# Patient Record
Sex: Male | Born: 1960 | Race: Black or African American | Hispanic: No | Marital: Married | Smoking: Current every day smoker
Health system: Southern US, Community
[De-identification: ages and names within clinical notes are randomized; demographics above are authoritative.]

## PROBLEM LIST (undated history)

## (undated) ENCOUNTER — Emergency Department: Admission: EM | Payer: BC Managed Care – PPO | Source: Home / Self Care

## (undated) DIAGNOSIS — I10 Essential (primary) hypertension: Secondary | ICD-10-CM

## (undated) DIAGNOSIS — K429 Umbilical hernia without obstruction or gangrene: Secondary | ICD-10-CM

## (undated) DIAGNOSIS — I739 Peripheral vascular disease, unspecified: Secondary | ICD-10-CM

## (undated) DIAGNOSIS — Z992 Dependence on renal dialysis: Secondary | ICD-10-CM

## (undated) DIAGNOSIS — Z8739 Personal history of other diseases of the musculoskeletal system and connective tissue: Secondary | ICD-10-CM

## (undated) DIAGNOSIS — N186 End stage renal disease: Secondary | ICD-10-CM

## (undated) DIAGNOSIS — I509 Heart failure, unspecified: Secondary | ICD-10-CM

## (undated) DIAGNOSIS — J449 Chronic obstructive pulmonary disease, unspecified: Secondary | ICD-10-CM

## (undated) DIAGNOSIS — E785 Hyperlipidemia, unspecified: Secondary | ICD-10-CM

## (undated) DIAGNOSIS — E119 Type 2 diabetes mellitus without complications: Secondary | ICD-10-CM

## (undated) DIAGNOSIS — R0600 Dyspnea, unspecified: Secondary | ICD-10-CM

## (undated) DIAGNOSIS — I639 Cerebral infarction, unspecified: Secondary | ICD-10-CM

## (undated) HISTORY — PX: CARDIAC CATHETERIZATION: SHX172

## (undated) HISTORY — PX: EXTERIORIZATION OF A CONTINUOUS AMBULATORY PERITONEAL DIALYSIS CATHETER: SHX6382

## (undated) HISTORY — PX: FRACTURE SURGERY: SHX138

## (undated) HISTORY — PX: WRIST FRACTURE SURGERY: SHX121

## (undated) HISTORY — DX: Essential (primary) hypertension: I10

## (undated) HISTORY — DX: Hyperlipidemia, unspecified: E78.5

## (undated) SURGERY — ECHOCARDIOGRAM, TRANSESOPHAGEAL
Anesthesia: Moderate Sedation

---

## 2009-12-31 ENCOUNTER — Ambulatory Visit: Payer: Self-pay | Admitting: General Practice

## 2010-01-01 ENCOUNTER — Ambulatory Visit: Payer: Self-pay | Admitting: General Practice

## 2010-01-07 ENCOUNTER — Ambulatory Visit: Payer: Self-pay | Admitting: Cardiovascular Disease

## 2010-09-22 ENCOUNTER — Ambulatory Visit: Payer: Self-pay | Admitting: Vascular Surgery

## 2010-09-22 DIAGNOSIS — I10 Essential (primary) hypertension: Secondary | ICD-10-CM

## 2010-10-20 ENCOUNTER — Ambulatory Visit: Payer: Self-pay | Admitting: Vascular Surgery

## 2015-02-16 ENCOUNTER — Other Ambulatory Visit: Payer: Self-pay | Admitting: Adult Health

## 2015-02-16 DIAGNOSIS — R1011 Right upper quadrant pain: Secondary | ICD-10-CM

## 2015-02-18 ENCOUNTER — Ambulatory Visit
Admission: RE | Admit: 2015-02-18 | Discharge: 2015-02-18 | Disposition: A | Discharge status: Home / Self Care | Payer: Medicare Other | Source: Ambulatory Visit | Attending: Family Medicine | Admitting: Family Medicine

## 2015-02-18 ENCOUNTER — Other Ambulatory Visit: Payer: Self-pay | Admitting: Adult Health

## 2015-02-18 DIAGNOSIS — K760 Fatty (change of) liver, not elsewhere classified: Secondary | ICD-10-CM | POA: Insufficient documentation

## 2015-02-18 DIAGNOSIS — R1011 Right upper quadrant pain: Secondary | ICD-10-CM | POA: Insufficient documentation

## 2015-02-22 ENCOUNTER — Ambulatory Visit: Payer: Medicare Other

## 2016-01-25 ENCOUNTER — Other Ambulatory Visit: Payer: Self-pay | Admitting: Adult Health

## 2016-01-25 ENCOUNTER — Ambulatory Visit: Payer: Medicare Other

## 2016-01-25 DIAGNOSIS — R600 Localized edema: Secondary | ICD-10-CM

## 2016-01-26 ENCOUNTER — Ambulatory Visit
Admission: RE | Admit: 2016-01-26 | Discharge: 2016-01-26 | Disposition: A | Discharge status: Home / Self Care | Payer: Medicare Other | Source: Ambulatory Visit | Attending: Adult Health | Admitting: Adult Health

## 2016-01-26 DIAGNOSIS — R600 Localized edema: Secondary | ICD-10-CM

## 2016-02-22 ENCOUNTER — Other Ambulatory Visit (INDEPENDENT_AMBULATORY_CARE_PROVIDER_SITE_OTHER): Payer: Self-pay | Admitting: Vascular Surgery

## 2016-02-22 ENCOUNTER — Encounter (INDEPENDENT_AMBULATORY_CARE_PROVIDER_SITE_OTHER): Payer: Self-pay

## 2016-02-23 ENCOUNTER — Telehealth (INDEPENDENT_AMBULATORY_CARE_PROVIDER_SITE_OTHER): Payer: Self-pay

## 2016-02-23 ENCOUNTER — Ambulatory Visit
Admission: RE | Admit: 2016-02-23 | Payer: BC Managed Care – PPO | Source: Ambulatory Visit | Admitting: Vascular Surgery

## 2016-02-23 ENCOUNTER — Encounter: Admission: RE | Payer: Self-pay | Source: Ambulatory Visit

## 2016-02-23 SURGERY — DIALYSIS/PERMA CATHETER INSERTION
Anesthesia: Moderate Sedation

## 2016-02-23 MED ORDER — DEXTROSE 5 % IV SOLN: 1.5000 g | INTRAVENOUS | Status: DC

## 2016-02-23 NOTE — Telephone Encounter (Signed)
Per the order from Dr. Thedore Mins this was to be an ASAP appt. That was scheduled.

## 2016-02-23 NOTE — Telephone Encounter (Signed)
Patient was scheduled on 02/23/16 for a permcath insertion and his wife was called and given this information for him. This was ordered by Dr. Thedore Mins, patient canceled this procedure stating he knew nothing about it and he is at work.

## 2016-02-25 ENCOUNTER — Telehealth (INDEPENDENT_AMBULATORY_CARE_PROVIDER_SITE_OTHER): Payer: Self-pay

## 2016-02-25 NOTE — Telephone Encounter (Signed)
Patient's wife called and left a message this morning for me to call the patient regarding his procedure. I attempted to contact the patient but had to leave a message for him to return my call.

## 2016-02-28 ENCOUNTER — Ambulatory Visit (INDEPENDENT_AMBULATORY_CARE_PROVIDER_SITE_OTHER): Payer: Medicare Other | Admitting: Vascular Surgery

## 2016-02-28 ENCOUNTER — Encounter (INDEPENDENT_AMBULATORY_CARE_PROVIDER_SITE_OTHER): Payer: Self-pay | Admitting: Vascular Surgery

## 2016-02-28 ENCOUNTER — Encounter (INDEPENDENT_AMBULATORY_CARE_PROVIDER_SITE_OTHER): Payer: Medicare Other | Admitting: Vascular Surgery

## 2016-02-28 DIAGNOSIS — E1122 Type 2 diabetes mellitus with diabetic chronic kidney disease: Secondary | ICD-10-CM

## 2016-02-28 DIAGNOSIS — N2889 Other specified disorders of kidney and ureter: Secondary | ICD-10-CM | POA: Insufficient documentation

## 2016-02-28 DIAGNOSIS — I10 Essential (primary) hypertension: Secondary | ICD-10-CM

## 2016-02-28 DIAGNOSIS — I890 Lymphedema, not elsewhere classified: Secondary | ICD-10-CM | POA: Diagnosis not present

## 2016-02-28 DIAGNOSIS — E119 Type 2 diabetes mellitus without complications: Secondary | ICD-10-CM | POA: Insufficient documentation

## 2016-02-28 DIAGNOSIS — N184 Chronic kidney disease, stage 4 (severe): Secondary | ICD-10-CM | POA: Insufficient documentation

## 2016-02-28 DIAGNOSIS — N185 Chronic kidney disease, stage 5: Secondary | ICD-10-CM | POA: Diagnosis not present

## 2016-02-28 NOTE — Progress Notes (Signed)
MRN : [id number]  Justin Fitzgerald is a 55 y.o. (1960/09/22) male who presents with chief complaint of  Chief Complaint  Patient presents with  . New Patient (Initial Visit)  .  History of Present Illness: Patient is seen for evaluation of leg swelling. The patient first noticed the swelling remotely but is now concerned because of a significant increase in the overall edema. The swelling is associated with pain and discoloration. The patient notes that in the morning the legs are significantly improved but they steadily worsened throughout the course of the day. Elevation makes the legs better, dependency makes them much worse. There is no history of ulcerations associated with the swelling. The patient denies any recent changes in their medications.  The patient has not been wearing graduated compression.  The patient denies a history of DVT or PE. There is no prior history of phlebitis. There is no history of primary lymphedema.  There is no history of radiation treatment to the groin or pelvis No history of malignancies. No history of trauma or groin or pelvic surgery. No history of foreign travel or parasitic infections area    Current Outpatient Prescriptions  Medication Sig Dispense Refill  . amLODipine (NORVASC) 10 MG tablet Take by mouth.    . cinacalcet (SENSIPAR) 30 MG tablet Take by mouth.    . cyclobenzaprine (FLEXERIL) 10 MG tablet     . doxycycline (VIBRAMYCIN) 100 MG capsule     . folic acid-vitamin b complex-vitamin c-selenium-zinc (DIALYVITE) 3 MG TABS tablet Take 1 tablet by mouth daily.    . furosemide (LASIX) 40 MG tablet Take by mouth.    . gabapentin (NEURONTIN) 300 MG capsule     . HYDROcodone-acetaminophen (NORCO/VICODIN) 5-325 MG tablet Take by mouth.    . metoprolol succinate (TOPROL-XL) 100 MG 24 hr tablet     . pioglitazone (ACTOS) 45 MG tablet TAKE 1 BY MOUTH ONCE A DAY    . SPIRIVA HANDIHALER 18 MCG inhalation capsule      No current  facility-administered medications for this visit.     Past Medical History:  Diagnosis Date  . Chronic kidney disease   . Diabetes mellitus without complication (HCC)   . Hyperlipidemia   . Hypertension     Past Surgical History:  Procedure Laterality Date  . arm surgery Right   . WRIST SURGERY Right     Social History Social History  Substance Use Topics  . Smoking status: Current Every Day Smoker    Types: Cigarettes  . Smokeless tobacco: Current User  . Alcohol use No    Family History Family History  Problem Relation Age of Onset  . Hypertension Mother   No family history of bleeding/clotting disorders, porphyria or autoimmune disease   Allergies  Allergen Reactions  . Ramipril Shortness Of Breath  . Aspirin     Other reaction(s): Unknown kidney disease     REVIEW OF SYSTEMS (Negative unless checked)  Constitutional: [] Weight loss  [] Fever  [] Chills Cardiac: [] Chest pain   [] Chest pressure   [] Palpitations   [] Shortness of breath at rest   [] Shortness of breath with exertion. Vascular:  [] Pain in legs with walking   [x] Pain in legs at rest    [] Pain in feet at rest    [] History of DVT   [] Phlebitis   [x] Swelling in legs   [] Varicose veins   [] Non-healing ulcers Pulmonary:   [] Uses home oxygen   [] Productive cough   [] Hemoptysis   [] Wheeze  []   COPD   [] Asthma Neurologic:  [] Dizziness  [] Blackouts   [] Seizures   [] History of stroke   [] History of TIA  [] Aphasia   [] Temporary blindness   [] Dysphagia   [] Weakness or numbness in arm   [] Weakness or numbness in leg Musculoskeletal:  [] Arthritis   [] Joint swelling   [] Joint pain   [] Low back pain Hematologic:  [] Easy bruising  [] Easy bleeding   [] Hypercoagulable state   [] Anemic   Gastrointestinal:  [] Blood in stool   [] Vomiting blood  [] Gastroesophageal reflux/heartburn   [] Difficulty swallowing. Genitourinary:  [x] Chronic kidney disease   [] Difficult urination  [] Frequent urination  [] Burning with urination   [] Blood  in urine Skin:  [] Rashes   [] Ulcers   Psychological:  [] History of anxiety   []  History of major depression.    Physical Examination  Vitals:   02/28/16 1342  BP: 109/67  Pulse: 81  Resp: 17  Weight: (!) 371 lb (168.3 kg)  Height: 6' (1.829 m)   Body mass index is 50.32 kg/m. Gen:  WD/WN, NAD Head: [place]/AT, No temporalis wasting. Ear/Nose/Throat: Hearing grossly intact, nares w/o erythema or drainage, trachea midline Eyes: PERR, EOM appear normal. Sclera non-icteric Neck: Supple, no nuchal rigidity.  No bruit or JVD.  Pulmonary:  Good air movement, equal and clear to auscultation bilaterally.  Cardiac: RRR, normal S1, S2, no Murmurs, rubs or gallops. Vascular:   3+ edema bilaterally moderate venous changes Vessel Right Left  Radial Palpable Palpable  Ulnar Palpable Palpable  Brachial Palpable Palpable  Carotid Palpable Palpable  Aorta Not palpable N/A  Femoral Palpable Palpable  Popliteal Palpable Palpable  PT Palpable Palpable  DP Palpable Palpable   Gastrointestinal: soft, non-rigid/non-distended. No guarding.  Musculoskeletal: M/S 5/5 throughout.  No deformity or atrophy. Neurologic: CN 2-12 intact. Pain and light touch intact in extremities.  Speech is fluent. Motor exam as listed above. Psychiatric: Judgment intact, Mood & affect appropriate for pt's clinical situation. Dermatologic: No rashes or ulcers noted.  No signs consistent with cellulitis. Lymphatic:  No cervical lymphadenopathy  CBC No results found for: WBC, HGB, HCT, MCV, PLT  BMET No results found for: NA, K, CL, CO2, GLUCOSE, BUN, CREATININE, CALCIUM, GFRNONAA, GFRAA CrCl cannot be calculated (No order found.).  COAG No results found for: INR, PROTIME  Radiology No results found.    Assessment/Plan 1. Chronic renal insufficiency, stage 4 (severe) (HCC) Recommend:  At this time the patient does not have appropriate extremity access for dialysis  Patient should have vein mapping to see  what access can be created.  All questions were answered.  The patient agrees to proceed with mapping for before surgery.   - VAS US UPPER EXTREMITY VEIN MAPPING; Future  2. Lymphedema Compression therapy is recommended - VAS US LOWER EXTREMITY VENOUS (DVT); Future  3. Essential hypertension Continue antihypertensive medications no changes  4. 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  Any errors from dictation are purely unintentional

## 2016-03-08 ENCOUNTER — Other Ambulatory Visit (INDEPENDENT_AMBULATORY_CARE_PROVIDER_SITE_OTHER): Payer: Self-pay | Admitting: Vascular Surgery

## 2016-03-08 ENCOUNTER — Ambulatory Visit
Admission: RE | Admit: 2016-03-08 | Discharge: 2016-03-08 | Disposition: A | Discharge status: Home / Self Care | Payer: Medicare Other | Source: Ambulatory Visit | Attending: Vascular Surgery | Admitting: Vascular Surgery

## 2016-03-08 ENCOUNTER — Encounter
Admission: RE | Disposition: A | Discharge status: Home / Self Care | Payer: Self-pay | Source: Ambulatory Visit | Attending: Vascular Surgery

## 2016-03-08 ENCOUNTER — Encounter: Payer: Self-pay | Admitting: *Deleted

## 2016-03-08 DIAGNOSIS — Z888 Allergy status to other drugs, medicaments and biological substances status: Secondary | ICD-10-CM | POA: Diagnosis not present

## 2016-03-08 DIAGNOSIS — I890 Lymphedema, not elsewhere classified: Secondary | ICD-10-CM | POA: Insufficient documentation

## 2016-03-08 DIAGNOSIS — E1122 Type 2 diabetes mellitus with diabetic chronic kidney disease: Secondary | ICD-10-CM | POA: Diagnosis not present

## 2016-03-08 DIAGNOSIS — N186 End stage renal disease: Secondary | ICD-10-CM | POA: Diagnosis not present

## 2016-03-08 DIAGNOSIS — Z7984 Long term (current) use of oral hypoglycemic drugs: Secondary | ICD-10-CM | POA: Insufficient documentation

## 2016-03-08 DIAGNOSIS — Z8249 Family history of ischemic heart disease and other diseases of the circulatory system: Secondary | ICD-10-CM | POA: Diagnosis not present

## 2016-03-08 DIAGNOSIS — I120 Hypertensive chronic kidney disease with stage 5 chronic kidney disease or end stage renal disease: Secondary | ICD-10-CM | POA: Diagnosis not present

## 2016-03-08 DIAGNOSIS — F17200 Nicotine dependence, unspecified, uncomplicated: Secondary | ICD-10-CM | POA: Insufficient documentation

## 2016-03-08 DIAGNOSIS — Z886 Allergy status to analgesic agent status: Secondary | ICD-10-CM | POA: Insufficient documentation

## 2016-03-08 HISTORY — DX: Peripheral vascular disease, unspecified: I73.9

## 2016-03-08 HISTORY — PX: PERIPHERAL VASCULAR CATHETERIZATION: SHX172C

## 2016-03-08 LAB — POTASSIUM (ARMC VASCULAR LAB ONLY): POTASSIUM (ARMC VASCULAR LAB): 4.4 (ref 3.5–5.1)

## 2016-03-08 SURGERY — DIALYSIS/PERMA CATHETER INSERTION
Anesthesia: Moderate Sedation

## 2016-03-08 MED ORDER — HEPARIN (PORCINE) IN NACL 2-0.9 UNIT/ML-% IJ SOLN
INTRAMUSCULAR | Status: AC
  Filled 2016-03-08: qty 500

## 2016-03-08 MED ORDER — SODIUM CHLORIDE 0.9 % IV SOLN: INTRAVENOUS | Status: DC

## 2016-03-08 MED ORDER — ONDANSETRON HCL 4 MG/2ML IJ SOLN: 4.0000 mg | Freq: Four times a day (QID) | INTRAMUSCULAR | Status: DC | PRN

## 2016-03-08 MED ORDER — SODIUM CHLORIDE 0.9 % IV SOLN
INTRAVENOUS | Status: DC
  Administered 2016-03-08: 11:00:00 via INTRAVENOUS

## 2016-03-08 MED ORDER — METHYLPREDNISOLONE SODIUM SUCC 125 MG IJ SOLR: 125.0000 mg | INTRAMUSCULAR | Status: DC | PRN

## 2016-03-08 MED ORDER — HEPARIN SODIUM (PORCINE) 10000 UNIT/ML IJ SOLN
INTRAMUSCULAR | Status: AC
  Filled 2016-03-08: qty 1

## 2016-03-08 MED ORDER — DEXTROSE 5 % IV SOLN: 1.5000 g | INTRAVENOUS | Status: DC

## 2016-03-08 MED ORDER — FENTANYL CITRATE (PF) 100 MCG/2ML IJ SOLN
INTRAMUSCULAR | Status: AC
  Filled 2016-03-08: qty 2

## 2016-03-08 MED ORDER — FAMOTIDINE 20 MG PO TABS: 40.0000 mg | ORAL_TABLET | ORAL | Status: DC | PRN

## 2016-03-08 MED ORDER — HYDROMORPHONE HCL 1 MG/ML IJ SOLN: 1.0000 mg | Freq: Once | INTRAMUSCULAR | Status: DC

## 2016-03-08 MED ORDER — MIDAZOLAM HCL 5 MG/5ML IJ SOLN
INTRAMUSCULAR | Status: AC
  Filled 2016-03-08: qty 5

## 2016-03-08 MED ORDER — MIDAZOLAM HCL 2 MG/2ML IJ SOLN
INTRAMUSCULAR | Status: DC | PRN
  Administered 2016-03-08 (×3): 1 mg via INTRAVENOUS
  Administered 2016-03-08: 2 mg via INTRAVENOUS

## 2016-03-08 MED ORDER — FENTANYL CITRATE (PF) 100 MCG/2ML IJ SOLN
INTRAMUSCULAR | Status: DC | PRN
  Administered 2016-03-08: 25 ug via INTRAVENOUS
  Administered 2016-03-08 (×3): 50 ug via INTRAVENOUS

## 2016-03-08 MED ORDER — HEPARIN SODIUM (PORCINE) 1000 UNIT/ML IJ SOLN
INTRAMUSCULAR | Status: AC
  Filled 2016-03-08: qty 1

## 2016-03-08 MED ORDER — LIDOCAINE-EPINEPHRINE (PF) 1 %-1:200000 IJ SOLN
INTRAMUSCULAR | Status: AC
  Filled 2016-03-08: qty 30

## 2016-03-08 SURGICAL SUPPLY — 5 items
CATH PALINDROME RT-P 15FX23CM (CATHETERS) ×3 IMPLANT
PACK ANGIOGRAPHY (CUSTOM PROCEDURE TRAY) ×3 IMPLANT
SET INTRO CAPELLA COAXIAL (SET/KITS/TRAYS/PACK) ×3 IMPLANT
TOWEL OR 17X26 4PK STRL BLUE (TOWEL DISPOSABLE) ×3 IMPLANT
WIRE J 3MM .035X145CM (WIRE) ×3 IMPLANT

## 2016-03-08 NOTE — H&P (Signed)
[facility] History & Physical Update  The patient was interviewed and re-examined.  The patient's previous History and Physical has been reviewed and is unchanged.  There is no change in the plan of care. We plan to proceed with the scheduled procedure.  Levora Dredge, MD  03/08/2016, 2:39 PM

## 2016-03-08 NOTE — Op Note (Signed)
OPERATIVE NOTE   PROCEDURE: 1. Insertion of tunneled dialysis catheter right IJ approach with ultrasound and fluoroscopic guidance.  PRE-OPERATIVE DIAGNOSIS: End-stage renal disease requiring hemodialysis; complication dialysis device with inadequate treatment via peritoneal catheter  POST-OPERATIVE DIAGNOSIS: Same  SURGEON: Levora Dredge.  ANESTHESIA: Conscious sedation was administered under my direct supervision. IV Versed plus fentanyl were utilized. Continuous ECG, pulse oximetry and blood pressure was monitored throughout the entire procedure. Conscious sedation was for a total of 25 minutes.  ESTIMATED BLOOD LOSS: Minimal cc  CONTRAST USED:  None  FLUOROSCOPY TIME:  0.3 minutes  INDICATIONS:   Justin Fitzgerald a 55 y.o. y.o. male who presents with end-stage renal disease who is not done well on peritoneal and is now transitioning to hemodialysis.  DESCRIPTION: After obtaining full informed written consent, the patient was positioned supine. The right neck and chest wall was prepped and draped in a sterile fashion. Ultrasound was placed in a sterile sleeve. Ultrasound was utilized to identify the right internal jugular vein which is noted to be echolucent and compressible indicating patency. Images recorded for the permanent record. Under real-time visualization a micro-needle is inserted into the vein followed by the micro-wire. Micro-sheath was then advanced without difficulty and a J wire is advanced without difficulty under fluoroscopic guidance. Small counterincision was made at the wire insertion site. Dilators are passed over the wire and the tunneled dialysis catheter is fed into the central venous system without difficulty.  Under fluoroscopy the catheter tip positioned at the atrial caval junction. The catheter is then approximated to the chest wall and an exit site selected. 1% lidocaine is infiltrated in soft tissues at this level small incision is made and the  tunneling device is then passed from the exit site to the neck counterincision. Catheter is then connected to the tunneling device and the catheter was pulled subcutaneously. It is then transected and the hub assembly connected without difficulty. Both lumens aspirate and flush easily. After verification of smooth contour with proper tip position under fluoroscopy the catheter is packed with 5000 units of heparin per lumen.  Catheter secured to the skin of the right chest wall with 0 silk. A sterile dressing is applied with a Biopatch.  COMPLICATIONS: None  CONDITION: Good  Levora Dredge [facility]. Office:  [phone]   03/08/2016,2:41 PM

## 2016-03-14 ENCOUNTER — Encounter: Payer: Self-pay | Admitting: Vascular Surgery

## 2016-03-23 ENCOUNTER — Encounter (INDEPENDENT_AMBULATORY_CARE_PROVIDER_SITE_OTHER): Payer: Medicare Other | Admitting: Vascular Surgery

## 2016-03-27 ENCOUNTER — Ambulatory Visit (INDEPENDENT_AMBULATORY_CARE_PROVIDER_SITE_OTHER): Payer: BC Managed Care – PPO | Admitting: Vascular Surgery

## 2016-03-27 ENCOUNTER — Encounter (INDEPENDENT_AMBULATORY_CARE_PROVIDER_SITE_OTHER): Payer: Self-pay | Admitting: Vascular Surgery

## 2016-03-27 VITALS — BP 84/52 | HR 73 | Resp 17 | Ht 72.0 in | Wt 351.0 lb

## 2016-03-27 DIAGNOSIS — T829XXA Unspecified complication of cardiac and vascular prosthetic device, implant and graft, initial encounter: Secondary | ICD-10-CM | POA: Insufficient documentation

## 2016-03-27 DIAGNOSIS — T829XXS Unspecified complication of cardiac and vascular prosthetic device, implant and graft, sequela: Secondary | ICD-10-CM

## 2016-03-27 DIAGNOSIS — N186 End stage renal disease: Secondary | ICD-10-CM | POA: Diagnosis not present

## 2016-03-27 DIAGNOSIS — N185 Chronic kidney disease, stage 5: Secondary | ICD-10-CM | POA: Diagnosis not present

## 2016-03-27 DIAGNOSIS — I10 Essential (primary) hypertension: Secondary | ICD-10-CM | POA: Diagnosis not present

## 2016-03-27 DIAGNOSIS — E1122 Type 2 diabetes mellitus with diabetic chronic kidney disease: Secondary | ICD-10-CM

## 2016-03-27 NOTE — Progress Notes (Signed)
MRN : [id number]  Justin Fitzgerald is a 55 y.o. (1961-04-03) male who presents with chief complaint of  Chief Complaint  Patient presents with  . Follow-up  .  History of Present Illness:  The patient is seen for evaluation of dialysis access.  The patient has a history of  PD.    Current access is via a catheter which is functioning well.  There have not been any episodes of catheter infection.  The patient denies amaurosis fugax or recent TIA symptoms. There are no recent neurological changes noted. The patient denies claudication symptoms or rest pain symptoms. The patient denies history of DVT, PE or superficial thrombophlebitis. The patient denies recent episodes of angina or shortness of breath.   Current Meds  Medication Sig  . amLODipine (NORVASC) 10 MG tablet Take by mouth.  . cinacalcet (SENSIPAR) 30 MG tablet Take by mouth.  . cyclobenzaprine (FLEXERIL) 10 MG tablet   . doxycycline (VIBRAMYCIN) 100 MG capsule   . folic acid-vitamin b complex-vitamin c-selenium-zinc (DIALYVITE) 3 MG TABS tablet Take 1 tablet by mouth daily.  . furosemide (LASIX) 40 MG tablet Take 80 mg by mouth 2 (two) times daily.   [name]. gabapentin (NEURONTIN) 300 MG capsule   . HYDROcodone-acetaminophen (NORCO/VICODIN) 5-325 MG tablet Take by mouth.  . metoprolol succinate (TOPROL-XL) 100 MG 24 hr tablet   . pioglitazone (ACTOS) 45 MG tablet TAKE 1 BY MOUTH ONCE A DAY  . SPIRIVA HANDIHALER 18 MCG inhalation capsule   . traMADol (ULTRAM) 50 MG tablet Take by mouth every 4 (four) hours as needed.    Past Medical History:  Diagnosis Date  . Chronic kidney disease   . Diabetes mellitus without complication (HCC)   . Hyperlipidemia   . Hypertension   . Peripheral vascular disease [facility](HCC)     Past Surgical History:  Procedure Laterality Date  . arm surgery Right   . EXTERIORIZATION OF A CONTINUOUS AMBULATORY PERITONEAL DIALYSIS CATHETER    . PERIPHERAL VASCULAR CATHETERIZATION N/A 03/08/2016     Procedure: Dialysis/Perma Catheter Insertion;  Surgeon: Renford DillsGregory G Schnier, MD;  Location: [facility] INVASIVE CV LAB;  Service: Cardiovascular;  Laterality: N/A;  . WRIST SURGERY Right     Social History Social History  Substance Use Topics  . Smoking status: Current Every Day Smoker    Types: Cigarettes  . Smokeless tobacco: Current User  . Alcohol use No    Family History Family History  Problem Relation Age of Onset  . Hypertension Mother   No family history of bleeding/clotting disorders, porphyria or autoimmune disease   Allergies  Allergen Reactions  . Ramipril Shortness Of Breath  . Aspirin     Other reaction(s): Unknown kidney disease     REVIEW OF SYSTEMS (Negative unless checked)  Constitutional: [] Weight loss  [] Fever  [] Chills Cardiac: [] Chest pain   [] Chest pressure   [] Palpitations   [] Shortness of breath when laying flat   [] Shortness of breath with exertion. Vascular:  [] Pain in legs with walking   [] Pain in legs at rest  [] History of DVT   [] Phlebitis   [] Swelling in legs   [] Varicose veins   [] Non-healing ulcers Pulmonary:   [] Uses home oxygen   [] Productive cough   [] Hemoptysis   [] Wheeze  [] COPD   [] Asthma Neurologic:  [] Dizziness   [] Seizures   [] History of stroke   [] History of TIA  [] Aphasia   [] Vissual changes   [] Weakness or numbness in arm   [] Weakness or numbness in leg  Musculoskeletal:   [] Joint swelling   [] Joint pain   [] Low back pain Hematologic:  [] Easy bruising  [] Easy bleeding   [] Hypercoagulable state   [] Anemic Gastrointestinal:  [] Diarrhea   [] Vomiting  [] Gastroesophageal reflux/heartburn   [] Difficulty swallowing. Genitourinary:  [x] Chronic kidney disease   [] Difficult urination  [] Frequent urination   [] Blood in urine Skin:  [] Rashes   [] Ulcers  Psychological:  [] History of anxiety   []  History of major depression.  Physical Examination  Vitals:   03/27/16 1523  BP: (!) 84/52  Pulse: 73  Resp: 17  Weight: (!) 351 lb (159.2 kg)   Height: 6' (1.829 m)   Body mass index is 47.6 kg/m. Gen: WD/WN, NAD Head: [place]/AT, No temporalis wasting.  Ear/Nose/Throat: Hearing grossly intact, nares w/o erythema or drainage, poor dentition Eyes: PER, EOMI, sclera nonicteric.  Neck: Supple, no masses.  No bruit or JVD.  Pulmonary:  Good air movement, clear to auscultation bilaterally, no use of accessory muscles.  Cardiac: RRR, normal S1, S2, no Murmurs. Vascular: PD catheter in place no infection, tunneled catheter intact. Vessel Right Left  Radial Palpable Palpable  Ulnar Palpable Palpable  Brachial Palpable Palpable  Gastrointestinal: soft, non-distended. No guarding/no peritoneal signs.  Musculoskeletal: M/S 5/5 throughout.  No deformity or atrophy.  Neurologic: CN 2-12 intact. Pain and light touch intact in extremities.  Symmetrical.  Speech is fluent. Motor exam as listed above. Psychiatric: Judgment intact, Mood & affect appropriate for pt's clinical situation. Dermatologic: No rashes or ulcers noted.  No changes consistent with cellulitis. Lymph : No Cervical lymphadenopathy, no lichenification or skin changes of chronic lymphedema.  CBC No results found for: WBC, HGB, HCT, MCV, PLT  BMET No results found for: NA, K, CL, CO2, GLUCOSE, BUN, CREATININE, CALCIUM, GFRNONAA, GFRAA CrCl cannot be calculated (No order found.).  COAG No results found for: INR, PROTIME  Radiology No results found.   Assessment/Plan 1. End stage renal disease (HCC) Recommend:  The patient is doing well and currently has adequate dialysis catheter access. The patient's dialysis center is not reporting any access issues. He is hopeful to return to PD   The patient will follow-up with me in the office as needed  2. Complication from renal dialysis device, sequela See #1  3. Essential hypertension Continue antihypertensive medications as already ordered and reviewed, no changes at this time.  Continue statin as ordered and  reviewed, no changes at this time  4. Type 2 diabetes mellitus with stage 5 chronic kidney disease not on chronic dialysis, without long-term current use of insulin (HCC) Continue antihypertensive medications as already ordered and reviewed, no changes at this time.   Levora Dredge, MD  03/27/2016 5:40 PM

## 2016-03-28 ENCOUNTER — Other Ambulatory Visit: Payer: Self-pay | Admitting: Family Medicine

## 2016-03-28 DIAGNOSIS — M79661 Pain in right lower leg: Secondary | ICD-10-CM

## 2016-04-12 ENCOUNTER — Encounter (INDEPENDENT_AMBULATORY_CARE_PROVIDER_SITE_OTHER): Payer: Self-pay

## 2016-04-12 ENCOUNTER — Other Ambulatory Visit (INDEPENDENT_AMBULATORY_CARE_PROVIDER_SITE_OTHER): Payer: Self-pay | Admitting: Vascular Surgery

## 2016-04-12 ENCOUNTER — Ambulatory Visit (INDEPENDENT_AMBULATORY_CARE_PROVIDER_SITE_OTHER): Payer: Medicare Other | Admitting: Vascular Surgery

## 2016-04-12 ENCOUNTER — Encounter (INDEPENDENT_AMBULATORY_CARE_PROVIDER_SITE_OTHER): Payer: Medicare Other

## 2016-04-13 ENCOUNTER — Ambulatory Visit
Admission: RE | Admit: 2016-04-13 | Discharge: 2016-04-13 | Disposition: A | Discharge status: Home / Self Care | Payer: Medicare Other | Source: Ambulatory Visit | Attending: Family Medicine | Admitting: Family Medicine

## 2016-04-20 ENCOUNTER — Ambulatory Visit
Admission: RE | Admit: 2016-04-20 | Discharge: 2016-04-20 | Disposition: A | Discharge status: Home / Self Care | Payer: Medicare Other | Source: Ambulatory Visit | Attending: Vascular Surgery | Admitting: Vascular Surgery

## 2016-04-20 ENCOUNTER — Encounter
Admission: RE | Disposition: A | Discharge status: Home / Self Care | Payer: Self-pay | Source: Ambulatory Visit | Attending: Vascular Surgery

## 2016-04-20 DIAGNOSIS — Z452 Encounter for adjustment and management of vascular access device: Secondary | ICD-10-CM | POA: Insufficient documentation

## 2016-04-20 DIAGNOSIS — Z8249 Family history of ischemic heart disease and other diseases of the circulatory system: Secondary | ICD-10-CM | POA: Insufficient documentation

## 2016-04-20 DIAGNOSIS — E1122 Type 2 diabetes mellitus with diabetic chronic kidney disease: Secondary | ICD-10-CM | POA: Diagnosis not present

## 2016-04-20 DIAGNOSIS — E1151 Type 2 diabetes mellitus with diabetic peripheral angiopathy without gangrene: Secondary | ICD-10-CM | POA: Diagnosis not present

## 2016-04-20 DIAGNOSIS — Z992 Dependence on renal dialysis: Secondary | ICD-10-CM | POA: Diagnosis not present

## 2016-04-20 DIAGNOSIS — E785 Hyperlipidemia, unspecified: Secondary | ICD-10-CM | POA: Diagnosis not present

## 2016-04-20 DIAGNOSIS — N186 End stage renal disease: Secondary | ICD-10-CM | POA: Insufficient documentation

## 2016-04-20 DIAGNOSIS — E119 Type 2 diabetes mellitus without complications: Secondary | ICD-10-CM

## 2016-04-20 DIAGNOSIS — F17200 Nicotine dependence, unspecified, uncomplicated: Secondary | ICD-10-CM | POA: Insufficient documentation

## 2016-04-20 DIAGNOSIS — Z886 Allergy status to analgesic agent status: Secondary | ICD-10-CM | POA: Insufficient documentation

## 2016-04-20 DIAGNOSIS — I120 Hypertensive chronic kidney disease with stage 5 chronic kidney disease or end stage renal disease: Secondary | ICD-10-CM | POA: Insufficient documentation

## 2016-04-20 DIAGNOSIS — Z888 Allergy status to other drugs, medicaments and biological substances status: Secondary | ICD-10-CM | POA: Insufficient documentation

## 2016-04-20 DIAGNOSIS — I10 Essential (primary) hypertension: Secondary | ICD-10-CM

## 2016-04-20 HISTORY — PX: PERIPHERAL VASCULAR CATHETERIZATION: SHX172C

## 2016-04-20 SURGERY — DIALYSIS/PERMA CATHETER REMOVAL
Anesthesia: Moderate Sedation

## 2016-04-20 MED ORDER — LIDOCAINE HCL (PF) 1 % IJ SOLN
INTRAMUSCULAR | Status: DC | PRN
  Administered 2016-04-20: 5 mL

## 2016-04-20 SURGICAL SUPPLY — 2 items
FORCEPS HALSTEAD CVD 5IN STRL (INSTRUMENTS) ×3 IMPLANT
TRAY LACERAT/PLASTIC (MISCELLANEOUS) ×3 IMPLANT

## 2016-04-20 NOTE — H&P (Signed)
[facility] Admission History & Physical  MRN : [id number]  Justin Fitzgerald is a 55 y.o. (1960/07/11) male who presents with chief complaint of No chief complaint on file. [name]  History of Present Illness: Patient has end-stage renal disease and is now using the peritoneal dialysis catheter instead of his PermCath. His PermCath now be removed. He has no complaints today.  No current facility-administered medications for this encounter.     Past Medical History:  Diagnosis Date  . Chronic kidney disease   . Diabetes mellitus without complication (HCC)   . Hyperlipidemia   . Hypertension   . Peripheral vascular disease [facility])     Past Surgical History:  Procedure Laterality Date  . arm surgery Right   . EXTERIORIZATION OF A CONTINUOUS AMBULATORY PERITONEAL DIALYSIS CATHETER    . PERIPHERAL VASCULAR CATHETERIZATION N/A 03/08/2016   Procedure: Dialysis/Perma Catheter Insertion;  Surgeon: Renford Dills, MD;  Location: [facility] INVASIVE CV LAB;  Service: Cardiovascular;  Laterality: N/A;  . WRIST SURGERY Right     Social History Social History  Substance Use Topics  . Smoking status: Current Every Day Smoker    Types: Cigarettes  . Smokeless tobacco: Current User  . Alcohol use No    Family History Family History  Problem Relation Age of Onset  . Hypertension Mother   No bleeding or clotting disorders  Allergies  Allergen Reactions  . Ramipril Shortness Of Breath  . Aspirin     Other reaction(s): Unknown kidney disease     REVIEW OF SYSTEMS (Negative unless checked)  Constitutional: [] Weight loss  [] Fever  [] Chills Cardiac: [] Chest pain   [] Chest pressure   [] Palpitations   [] Shortness of breath when laying flat   [] Shortness of breath at rest   [] Shortness of breath with exertion. Vascular:  [] Pain in legs with walking   [] Pain in legs at rest   [] Pain in legs when laying flat   [] Claudication   [] Pain in feet when walking  [] Pain in  feet at rest  [] Pain in feet when laying flat   [] History of DVT   [] Phlebitis   [] Swelling in legs   [] Varicose veins   [] Non-healing ulcers Pulmonary:   [] Uses home oxygen   [] Productive cough   [] Hemoptysis   [] Wheeze  [] COPD   [] Asthma Neurologic:  [] Dizziness  [] Blackouts   [] Seizures   [] History of stroke   [] History of TIA  [] Aphasia   [] Temporary blindness   [] Dysphagia   [] Weakness or numbness in arms   [] Weakness or numbness in legs Musculoskeletal:  [] Arthritis   [] Joint swelling   [] Joint pain   [] Low back pain Hematologic:  [] Easy bruising  [] Easy bleeding   [] Hypercoagulable state   [] Anemic  [] Hepatitis Gastrointestinal:  [] Blood in stool   [] Vomiting blood  [] Gastroesophageal reflux/heartburn   [] Difficulty swallowing. Genitourinary:  [x] Chronic kidney disease   [] Difficult urination  [] Frequent urination  [] Burning with urination   [] Blood in urine Skin:  [] Rashes   [] Ulcers   [] Wounds Psychological:  [] History of anxiety   []  History of major depression.  Physical Examination  Vitals:   04/20/16 1123  BP: 107/74  Pulse: 64  TempSrc: Oral  Weight: (!) 159.2 kg (351 lb)  Height: 6' (1.829 m)   Body mass index is 47.6 kg/m. Gen: WD/WN, NAD Head: [place]/AT, No temporalis wasting. Prominent temp pulse not noted. Ear/Nose/Throat: Hearing grossly intact, nares w/o erythema or drainage, oropharynx w/o Erythema/Exudate,  Eyes: Conjunctiva clear, sclera non-icteric Neck:  Trachea midline.  No JVD.  Pulmonary:  Good air movement, respirations not labored, no use of accessory muscles.  Cardiac: RRR, normal S1, S2. Vascular: PermCath exiting from the subclavicular region Vessel Right Left  Radial Palpable Palpable                                   Gastrointestinal: soft, non-tender/non-distended. No guarding/reflex. Peritoneal dialysis catheter in place without erythema or drainage Musculoskeletal: M/S 5/5 throughout.  Extremities without ischemic changes.  No deformity or  atrophy.  Neurologic: Sensation grossly intact in extremities.  Symmetrical.  Speech is fluent. Motor exam as listed above. Psychiatric: Judgment intact, Mood & affect appropriate for pt's clinical situation. Dermatologic: No rashes or ulcers noted.  No cellulitis or open wounds. Lymph : No Cervical, Axillary, or Inguinal lymphadenopathy.     CBC No results found for: WBC, HGB, HCT, MCV, PLT  BMET No results found for: NA, K, CL, CO2, GLUCOSE, BUN, CREATININE, CALCIUM, GFRNONAA, GFRAA CrCl cannot be calculated (No order found.).  COAG No results found for: INR, PROTIME  Radiology No results found.   Assessment/Plan 1. End-stage renal disease with functional dialysis access. Will remove PermCath today. 2. Diabetes. Stable on outpatient medications. 3. Hypertension. Stable on outpatient medications.   Festus BarrenJason Asti Mackley, MD  04/20/2016 11:29 AM

## 2016-04-20 NOTE — Op Note (Signed)
Operative Note     Preoperative diagnosis:   1. ESRD with functional permanent access  Postoperative diagnosis:  1. ESRD with functional permanent access  Procedure:  Removal of right jugular Permcath  Surgeon:  Festus Barren, MD  Anesthesia:  Local  EBL:  Minimal  Indication for the Procedure:  The patient has a functional permanent dialysis access and no longer needs their permcath.  This can be removed.  Risks and benefits are discussed and informed consent is obtained.  Description of the Procedure:  The patient's right neck, chest and existing catheter were sterilely prepped and draped. The area around the catheter was anesthetized copiously with 1% lidocaine. The catheter was dissected out with curved hemostats until the cuff was freed from the surrounding fibrous sheath. The fiber sheath was transected, and the catheter was then removed in its entirety using gentle traction. Pressure was held and sterile dressings were placed. The patient tolerated the procedure well and was taken to the recovery room in stable condition.     Festus Barren  04/20/2016, 11:45 AM This note was created with Dragon Medical transcription system. Any errors in dictation are purely unintentional.

## 2016-04-21 ENCOUNTER — Encounter: Payer: Self-pay | Admitting: Vascular Surgery

## 2016-04-27 ENCOUNTER — Ambulatory Visit
Admission: RE | Admit: 2016-04-27 | Discharge: 2016-04-27 | Disposition: A | Discharge status: Home / Self Care | Payer: Medicare Other | Source: Ambulatory Visit | Attending: Family Medicine | Admitting: Family Medicine

## 2016-04-27 DIAGNOSIS — M62561 Muscle wasting and atrophy, not elsewhere classified, right lower leg: Secondary | ICD-10-CM | POA: Diagnosis not present

## 2016-04-27 DIAGNOSIS — M62562 Muscle wasting and atrophy, not elsewhere classified, left lower leg: Secondary | ICD-10-CM | POA: Insufficient documentation

## 2016-04-27 DIAGNOSIS — M79661 Pain in right lower leg: Secondary | ICD-10-CM | POA: Insufficient documentation

## 2016-04-27 DIAGNOSIS — R600 Localized edema: Secondary | ICD-10-CM | POA: Diagnosis not present

## 2016-05-11 ENCOUNTER — Inpatient Hospital Stay
Admission: EM | Admit: 2016-05-11 | Discharge: 2016-05-12 | DRG: 371 | Disposition: A | Discharge status: Home / Self Care | Payer: Medicare Other | Attending: Internal Medicine | Admitting: Internal Medicine

## 2016-05-11 ENCOUNTER — Emergency Department: Payer: Medicare Other

## 2016-05-11 ENCOUNTER — Encounter: Payer: Self-pay | Admitting: Emergency Medicine

## 2016-05-11 DIAGNOSIS — I959 Hypotension, unspecified: Secondary | ICD-10-CM | POA: Diagnosis present

## 2016-05-11 DIAGNOSIS — E1122 Type 2 diabetes mellitus with diabetic chronic kidney disease: Secondary | ICD-10-CM | POA: Diagnosis present

## 2016-05-11 DIAGNOSIS — E1151 Type 2 diabetes mellitus with diabetic peripheral angiopathy without gangrene: Secondary | ICD-10-CM | POA: Diagnosis present

## 2016-05-11 DIAGNOSIS — Z886 Allergy status to analgesic agent status: Secondary | ICD-10-CM | POA: Diagnosis not present

## 2016-05-11 DIAGNOSIS — I890 Lymphedema, not elsewhere classified: Secondary | ICD-10-CM | POA: Diagnosis present

## 2016-05-11 DIAGNOSIS — I248 Other forms of acute ischemic heart disease: Secondary | ICD-10-CM | POA: Diagnosis present

## 2016-05-11 DIAGNOSIS — F17210 Nicotine dependence, cigarettes, uncomplicated: Secondary | ICD-10-CM | POA: Diagnosis present

## 2016-05-11 DIAGNOSIS — N186 End stage renal disease: Secondary | ICD-10-CM | POA: Diagnosis present

## 2016-05-11 DIAGNOSIS — M79604 Pain in right leg: Secondary | ICD-10-CM | POA: Diagnosis present

## 2016-05-11 DIAGNOSIS — J9600 Acute respiratory failure, unspecified whether with hypoxia or hypercapnia: Secondary | ICD-10-CM | POA: Diagnosis present

## 2016-05-11 DIAGNOSIS — Z992 Dependence on renal dialysis: Secondary | ICD-10-CM

## 2016-05-11 DIAGNOSIS — D631 Anemia in chronic kidney disease: Secondary | ICD-10-CM | POA: Diagnosis present

## 2016-05-11 DIAGNOSIS — Z888 Allergy status to other drugs, medicaments and biological substances status: Secondary | ICD-10-CM | POA: Diagnosis not present

## 2016-05-11 DIAGNOSIS — J9601 Acute respiratory failure with hypoxia: Secondary | ICD-10-CM | POA: Diagnosis present

## 2016-05-11 DIAGNOSIS — Z79899 Other long term (current) drug therapy: Secondary | ICD-10-CM | POA: Diagnosis not present

## 2016-05-11 DIAGNOSIS — Z7984 Long term (current) use of oral hypoglycemic drugs: Secondary | ICD-10-CM | POA: Diagnosis not present

## 2016-05-11 DIAGNOSIS — Z8249 Family history of ischemic heart disease and other diseases of the circulatory system: Secondary | ICD-10-CM

## 2016-05-11 DIAGNOSIS — B9620 Unspecified Escherichia coli [E. coli] as the cause of diseases classified elsewhere: Secondary | ICD-10-CM | POA: Diagnosis present

## 2016-05-11 DIAGNOSIS — I120 Hypertensive chronic kidney disease with stage 5 chronic kidney disease or end stage renal disease: Secondary | ICD-10-CM | POA: Diagnosis present

## 2016-05-11 DIAGNOSIS — J441 Chronic obstructive pulmonary disease with (acute) exacerbation: Secondary | ICD-10-CM | POA: Diagnosis present

## 2016-05-11 DIAGNOSIS — R0902 Hypoxemia: Secondary | ICD-10-CM

## 2016-05-11 DIAGNOSIS — R000 Tachycardia, unspecified: Secondary | ICD-10-CM | POA: Diagnosis present

## 2016-05-11 DIAGNOSIS — R7989 Other specified abnormal findings of blood chemistry: Secondary | ICD-10-CM

## 2016-05-11 DIAGNOSIS — Z7951 Long term (current) use of inhaled steroids: Secondary | ICD-10-CM | POA: Diagnosis not present

## 2016-05-11 DIAGNOSIS — E785 Hyperlipidemia, unspecified: Secondary | ICD-10-CM | POA: Diagnosis present

## 2016-05-11 DIAGNOSIS — K659 Peritonitis, unspecified: Secondary | ICD-10-CM | POA: Diagnosis present

## 2016-05-11 DIAGNOSIS — N2581 Secondary hyperparathyroidism of renal origin: Secondary | ICD-10-CM | POA: Diagnosis present

## 2016-05-11 DIAGNOSIS — R778 Other specified abnormalities of plasma proteins: Secondary | ICD-10-CM

## 2016-05-11 DIAGNOSIS — B958 Unspecified staphylococcus as the cause of diseases classified elsewhere: Secondary | ICD-10-CM | POA: Diagnosis present

## 2016-05-11 DIAGNOSIS — M898X6 Other specified disorders of bone, lower leg: Secondary | ICD-10-CM | POA: Diagnosis present

## 2016-05-11 LAB — GLUCOSE, CAPILLARY
GLUCOSE-CAPILLARY: 147 mg/dL — AB (ref 65–99)
Glucose-Capillary: 128 mg/dL — ABNORMAL HIGH (ref 65–99)

## 2016-05-11 LAB — BODY FLUID CELL COUNT WITH DIFFERENTIAL
Eos, Fluid: 0 %
Lymphs, Fluid: 5 %
Monocyte-Macrophage-Serous Fluid: 1 %
Neutrophil Count, Fluid: 94 %
WBC FLUID: 16504 uL

## 2016-05-11 LAB — CBC
HCT: 29.7 % — ABNORMAL LOW (ref 40.0–52.0)
Hemoglobin: 9.9 g/dL — ABNORMAL LOW (ref 13.0–18.0)
MCH: 30.1 pg (ref 26.0–34.0)
MCHC: 33.4 g/dL (ref 32.0–36.0)
MCV: 90.2 fL (ref 80.0–100.0)
PLATELETS: 225 10*3/uL (ref 150–440)
RBC: 3.29 MIL/uL — AB (ref 4.40–5.90)
RDW: 16.9 % — ABNORMAL HIGH (ref 11.5–14.5)
WBC: 7.5 10*3/uL (ref 3.8–10.6)

## 2016-05-11 LAB — BASIC METABOLIC PANEL
Anion gap: 14 (ref 5–15)
BUN: 60 mg/dL — ABNORMAL HIGH (ref 6–20)
CHLORIDE: 91 mmol/L — AB (ref 101–111)
CO2: 27 mmol/L (ref 22–32)
CREATININE: 11.68 mg/dL — AB (ref 0.61–1.24)
Calcium: 9.1 mg/dL (ref 8.9–10.3)
GFR calc non Af Amer: 4 mL/min — ABNORMAL LOW (ref >60)
GFR, EST AFRICAN AMERICAN: 5 mL/min — AB (ref >60)
GLUCOSE: 101 mg/dL — AB (ref 65–99)
Potassium: 4.6 mmol/L (ref 3.5–5.1)
Sodium: 132 mmol/L — ABNORMAL LOW (ref 135–145)

## 2016-05-11 LAB — TROPONIN I
TROPONIN I: 0.76 ng/mL — AB (ref <0.03)
Troponin I: 0.4 ng/mL (ref <0.03)
Troponin I: 0.38 ng/mL (ref <0.03)

## 2016-05-11 LAB — INFLUENZA PANEL BY PCR (TYPE A & B)
INFLAPCR: NEGATIVE
Influenza B By PCR: NEGATIVE

## 2016-05-11 LAB — PATHOLOGIST SMEAR REVIEW

## 2016-05-11 LAB — PROCALCITONIN: PROCALCITONIN: 2.19 ng/mL

## 2016-05-11 MED ORDER — IPRATROPIUM-ALBUTEROL 0.5-2.5 (3) MG/3ML IN SOLN
3.0000 mL | Freq: Once | RESPIRATORY_TRACT | Status: AC
  Administered 2016-05-11: 3 mL via RESPIRATORY_TRACT
  Filled 2016-05-11: qty 3

## 2016-05-11 MED ORDER — METHYLPREDNISOLONE SODIUM SUCC 125 MG IJ SOLR
60.0000 mg | Freq: Two times a day (BID) | INTRAMUSCULAR | Status: DC
  Administered 2016-05-11 – 2016-05-12 (×2): 60 mg via INTRAVENOUS
  Filled 2016-05-11 (×2): qty 2

## 2016-05-11 MED ORDER — BUDESONIDE 0.5 MG/2ML IN SUSP
0.5000 mg | Freq: Two times a day (BID) | RESPIRATORY_TRACT | Status: DC
  Administered 2016-05-11 – 2016-05-12 (×2): 0.5 mg via RESPIRATORY_TRACT
  Filled 2016-05-11 (×2): qty 2

## 2016-05-11 MED ORDER — SODIUM CHLORIDE 0.9% FLUSH
3.0000 mL | Freq: Two times a day (BID) | INTRAVENOUS | Status: DC
  Administered 2016-05-11 – 2016-05-12 (×3): 3 mL via INTRAVENOUS

## 2016-05-11 MED ORDER — IPRATROPIUM-ALBUTEROL 0.5-2.5 (3) MG/3ML IN SOLN
RESPIRATORY_TRACT | Status: AC
  Filled 2016-05-11: qty 3

## 2016-05-11 MED ORDER — CINACALCET HCL 30 MG PO TABS
120.0000 mg | ORAL_TABLET | Freq: Every day | ORAL | Status: DC
  Filled 2016-05-11: qty 4

## 2016-05-11 MED ORDER — METOPROLOL SUCCINATE ER 50 MG PO TB24
50.0000 mg | ORAL_TABLET | Freq: Every day | ORAL | Status: DC
  Filled 2016-05-11: qty 1

## 2016-05-11 MED ORDER — DIALYVITE 3000 3 MG PO TABS: 1.0000 | ORAL_TABLET | Freq: Every day | ORAL | Status: DC

## 2016-05-11 MED ORDER — HYDROCOD POLST-CPM POLST ER 10-8 MG/5ML PO SUER
5.0000 mL | Freq: Once | ORAL | Status: AC
  Administered 2016-05-11: 5 mL via ORAL

## 2016-05-11 MED ORDER — CEFTRIAXONE SODIUM-DEXTROSE 2-2.22 GM-% IV SOLR
2.0000 g | INTRAVENOUS | Status: DC
  Filled 2016-05-11: qty 50

## 2016-05-11 MED ORDER — DEXTROSE 5 % IV SOLN
1.0000 g | Freq: Once | INTRAVENOUS | Status: AC
  Administered 2016-05-11: 1 g via INTRAVENOUS
  Filled 2016-05-11: qty 1

## 2016-05-11 MED ORDER — DEXTROSE 5 % IV SOLN
500.0000 mg | INTRAVENOUS | Status: DC
  Administered 2016-05-12: 500 mg via INTRAVENOUS
  Filled 2016-05-11: qty 0.5

## 2016-05-11 MED ORDER — HEPARIN SODIUM (PORCINE) 5000 UNIT/ML IJ SOLN
5000.0000 [IU] | Freq: Three times a day (TID) | INTRAMUSCULAR | Status: DC
  Administered 2016-05-11 – 2016-05-12 (×3): 5000 [IU] via SUBCUTANEOUS
  Filled 2016-05-11 (×3): qty 1

## 2016-05-11 MED ORDER — DELFLEX-LC/2.5% DEXTROSE 394 MOSM/L IP SOLN
Freq: Every day | INTRAPERITONEAL | Status: DC
  Administered 2016-05-11: 21:00:00 via INTRAPERITONEAL
  Filled 2016-05-11 (×7): qty 3000

## 2016-05-11 MED ORDER — INSULIN ASPART 100 UNIT/ML ~~LOC~~ SOLN: 0.0000 [IU] | Freq: Every day | SUBCUTANEOUS | Status: DC

## 2016-05-11 MED ORDER — NICOTINE 21 MG/24HR TD PT24
21.0000 mg | MEDICATED_PATCH | Freq: Every day | TRANSDERMAL | Status: DC
  Filled 2016-05-11: qty 1

## 2016-05-11 MED ORDER — SEVELAMER CARBONATE 2.4 G PO PACK
2.4000 g | PACK | Freq: Three times a day (TID) | ORAL | Status: DC
  Administered 2016-05-12: 2.4 g via ORAL
  Filled 2016-05-11: qty 1

## 2016-05-11 MED ORDER — ACETAMINOPHEN 650 MG RE SUPP: 650.0000 mg | Freq: Four times a day (QID) | RECTAL | Status: DC | PRN

## 2016-05-11 MED ORDER — GENTAMICIN SULFATE 0.1 % EX CREA
1.0000 "application " | TOPICAL_CREAM | Freq: Every day | CUTANEOUS | Status: DC
  Administered 2016-05-11: 1 via TOPICAL
  Filled 2016-05-11: qty 15

## 2016-05-11 MED ORDER — METHYLPREDNISOLONE SODIUM SUCC 125 MG IJ SOLR
125.0000 mg | Freq: Once | INTRAMUSCULAR | Status: AC
  Administered 2016-05-11: 125 mg via INTRAVENOUS
  Filled 2016-05-11: qty 2

## 2016-05-11 MED ORDER — VANCOMYCIN HCL 10 G IV SOLR
2500.0000 mg | Freq: Once | INTRAVENOUS | Status: AC
  Administered 2016-05-11: 2500 mg via INTRAVENOUS
  Filled 2016-05-11: qty 2500

## 2016-05-11 MED ORDER — ACETAMINOPHEN 325 MG PO TABS: 650.0000 mg | ORAL_TABLET | Freq: Four times a day (QID) | ORAL | Status: DC | PRN

## 2016-05-11 MED ORDER — IPRATROPIUM-ALBUTEROL 0.5-2.5 (3) MG/3ML IN SOLN
3.0000 mL | Freq: Four times a day (QID) | RESPIRATORY_TRACT | Status: DC
  Administered 2016-05-11 (×2): 3 mL via RESPIRATORY_TRACT
  Filled 2016-05-11 (×2): qty 3

## 2016-05-11 MED ORDER — METOLAZONE 2.5 MG PO TABS
2.5000 mg | ORAL_TABLET | Freq: Every day | ORAL | Status: DC
Start: 2016-05-12 — End: 2016-05-12
  Administered 2016-05-12: 2.5 mg via ORAL
  Filled 2016-05-11: qty 1

## 2016-05-11 MED ORDER — IPRATROPIUM-ALBUTEROL 0.5-2.5 (3) MG/3ML IN SOLN
3.0000 mL | Freq: Three times a day (TID) | RESPIRATORY_TRACT | Status: DC
  Administered 2016-05-12: 3 mL via RESPIRATORY_TRACT
  Filled 2016-05-11 (×2): qty 3

## 2016-05-11 MED ORDER — BUDESONIDE 0.25 MG/2ML IN SUSP
RESPIRATORY_TRACT | Status: AC
  Administered 2016-05-11: 0.25 mg
  Filled 2016-05-11: qty 2

## 2016-05-11 MED ORDER — INSULIN ASPART 100 UNIT/ML ~~LOC~~ SOLN
0.0000 [IU] | Freq: Three times a day (TID) | SUBCUTANEOUS | Status: DC
  Filled 2016-05-11: qty 1

## 2016-05-11 MED ORDER — HYDROCOD POLST-CPM POLST ER 10-8 MG/5ML PO SUER
ORAL | Status: AC
  Administered 2016-05-11: 5 mL via ORAL
  Filled 2016-05-11: qty 5

## 2016-05-11 MED ORDER — FUROSEMIDE 40 MG PO TABS
80.0000 mg | ORAL_TABLET | Freq: Two times a day (BID) | ORAL | Status: DC
  Administered 2016-05-11 – 2016-05-12 (×2): 80 mg via ORAL
  Filled 2016-05-11 (×2): qty 2

## 2016-05-11 MED ORDER — IPRATROPIUM-ALBUTEROL 0.5-2.5 (3) MG/3ML IN SOLN
RESPIRATORY_TRACT | Status: AC
  Administered 2016-05-11: 3 mL via RESPIRATORY_TRACT
  Filled 2016-05-11: qty 3

## 2016-05-11 MED ORDER — GABAPENTIN 300 MG PO CAPS
300.0000 mg | ORAL_CAPSULE | Freq: Every day | ORAL | Status: DC
  Administered 2016-05-11: 300 mg via ORAL
  Filled 2016-05-11: qty 1

## 2016-05-11 NOTE — Progress Notes (Signed)
Pharmacy Antibiotic Note  Adalbert Dumitrescu is a 55 y.o. male admitted on 05/11/2016 with peritonitis.  Pharmacy has been consulted for vancomycin and ceftazidime dosing. Pt is on peritoneal dialysis.  Plan: Ceftazidime 1 g IV x1 then 500 mg IV q24h.   Vancomycin loading dose of 2500 mg IV x1 Random vancomycin level on day 3 of therapy with AM labs Plan to re-dose vancomycin when level <20 mcg/mL  Will need to follow up on dialysis plans (peritoneal vs HD). No nephrology note available at this time.   Height: 6' (182.9 cm) Weight: (!) 365 lb (165.6 kg) IBW/kg (Calculated) : 77.6  ABW 112.8 kg  Temp (24hrs), Avg:98.9 F (37.2 C), Min:98.9 F (37.2 C), Max:98.9 F (37.2 C)   Recent Labs Lab 05/11/16 0527  WBC 7.5  CREATININE 11.68*    Estimated Creatinine Clearance: 11.1 mL/min (by C-G formula based on SCr of 11.68 mg/dL (H)).    Allergies  Allergen Reactions  . Ramipril Shortness Of Breath  . Aspirin     Other reaction(s): Unknown kidney disease    Antimicrobials this admission: ceftazidime 12/28 >> Vancomycin 12/28 >>  Dose adjustments this admission:  Microbiology results:  BCx: ordered Body fluid cx sent  Thank you for allowing pharmacy to be a part of this patient's care.  Marty Heck 05/11/2016 3:46 PM

## 2016-05-11 NOTE — Progress Notes (Signed)
[facility]  ROUNDING NOTE   Subjective:   Justin Fitzgerald was admitted to [facility] on 05/11/2016 for abd pain   Found to be in peritonitis with wbc [place] of [id number]  Wife at bedside.   Objective:  Vital signs in last 24 hours:  Temp:  [98.9 F (37.2 C)] 98.9 F (37.2 C) (12/28 0509) Pulse Rate:  [84-116] 84 (12/28 1306) Resp:  [8-23] 13 (12/28 1306) BP: (89-158)/(44-75) 122/66 (12/28 1306) SpO2:  [90 %-99 %] 95 % (12/28 1306) Weight:  [165.6 kg (365 lb)] 165.6 kg (365 lb) (12/28 0511)  Weight change:  Filed Weights   05/11/16 0511  Weight: (!) 165.6 kg (365 lb)    Intake/Output: No intake/output data recorded.   Intake/Output this shift:  No intake/output data recorded.  Physical Exam: General: NAD, laying at bed  Head: Normocephalic, atraumatic. Moist oral mucosal membranes  Eyes: Anicteric, PERRL  Neck: Supple, trachea midline  Lungs:  Clear to auscultation  Heart: Regular rate and rhythm  Abdomen:  Soft, nontender, obese  Extremities:  1+ peripheral edema.  Neurologic: Nonfocal, moving all four extremities  Skin: No lesions  Access: PD catheter    Basic Metabolic Panel:  Recent Labs Lab 05/11/16 0527  NA 132*  K 4.6  CL 91*  CO2 27  GLUCOSE 101*  BUN 60*  CREATININE 11.68*  CALCIUM 9.1    Liver Function Tests: No results for input(s): AST, ALT, ALKPHOS, BILITOT, PROT, ALBUMIN in the last 168 hours. No results for input(s): LIPASE, AMYLASE in the last 168 hours. No results for input(s): AMMONIA in the last 168 hours.  CBC:  Recent Labs Lab 05/11/16 0527  WBC 7.5  HGB 9.9*  HCT 29.7*  MCV 90.2  PLT 225    Cardiac Enzymes:  Recent Labs Lab 05/11/16 0527 05/11/16 0831  TROPONINI 0.40* 0.38*    BNP: Invalid input(s): POCBNP  CBG: No results for input(s): GLUCAP in the last 168 hours.  Microbiology: No results found for this or any previous visit.  Coagulation Studies: No results for input(s): LABPROT, INR  in the last 72 hours.  Urinalysis: No results for input(s): COLORURINE, LABSPEC, PHURINE, GLUCOSEU, HGBUR, BILIRUBINUR, KETONESUR, PROTEINUR, UROBILINOGEN, NITRITE, LEUKOCYTESUR in the last 72 hours.  Invalid input(s): APPERANCEUR    Imaging: Dg Chest Port 1 View  Result Date: 05/11/2016 CLINICAL DATA:  Cough and chills EXAM: PORTABLE CHEST 1 VIEW COMPARISON:  None. FINDINGS: Cardiac silhouette is enlarged with atherosclerotic calcification in the aortic arch. There are diffuse mild interstitial opacities. There is no pneumothorax or sizable pleural effusion. No lobar consolidation. IMPRESSION: Cardiomegaly, aortic atherosclerosis without overt pulmonary edema. Electronically Signed   By: Deatra Robinson M.D.   On: 05/11/2016 05:59     Medications:   . vancomycin     . budesonide (PULMICORT) nebulizer solution  0.5 mg Nebulization BID  . cefTRIAXone  2 g Intravenous Q24H  . folic acid-vitamin b complex-vitamin c-selenium-zinc  1 tablet Oral Daily  . furosemide  80 mg Oral BID  . gabapentin  300 mg Oral QHS  . heparin  5,000 Units Subcutaneous Q8H  . insulin aspart  0-5 Units Subcutaneous QHS  . insulin aspart  0-9 Units Subcutaneous TID WC  . ipratropium-albuterol  3 mL Nebulization Q6H  . methylPREDNISolone (SOLU-MEDROL) injection  60 mg Intravenous Q12H  . [START ON 05/12/2016] metolazone  2.5 mg Oral Daily  . metoprolol succinate  50 mg Oral Daily  . nicotine  21 mg Transdermal Daily  .  sodium chloride flush  3 mL Intravenous Q12H   acetaminophen **OR** acetaminophen  Assessment/ Plan:  [name] is a 55 y.o. black male with End Stage Renal Disease on peritoneal dialysis, hypertension, diabetes mellitus type II, chronic lymphedema, peripheral vascular disease  PD [facility]  1. End Stage Renal Disease on peritoneal dialysis: with active peritonitis. Most recent adequacy not at goal. No residual urine output reported.  PD prescription 11 hours with  6 exchanges 3 litre fills - Dialysis tonight - Use 2.5% dextrose - Discussed with patient and family about transitioning to hemodialysis.   2. Peritonitis: cultures pending. Empiric ceftriaxone and vancomycin.   3. Hypertension: hypotensive - metolazone, furosemide and metoprolol ordered.   4. Anemia of chronic kidney disease: hemoglobin 9.9. EPO as outpatient.   5. Secondary Hyperparathyroidism: with hyperphosphatemia - cinacalcet 120mg  daily - sevelamer 2.4gm with meals.   6. Diabetes mellitus type II with chronic kidney disease: on Actos as outpatient Hemoglobin A1c 5.4% in October.    LOS: 0 Jakya Dovidio 12/28/20172:54 PM

## 2016-05-11 NOTE — Progress Notes (Signed)
Notified Dr. Elpidio Anis of elevated troponin.  No new orders.

## 2016-05-11 NOTE — Progress Notes (Signed)
Pharmacy Antibiotic Note  Graden Sandmeyer is a 55 y.o. male admitted on 05/11/2016 with peritonitis.  Pharmacy has been consulted for vancomycin and ceftraixone dosing. Pt is on peritoneal dialysis.  Plan: Ceftraixone 2 g IV q24h.   Vancomycin loading dose of 2500 mg IV x1 Random vancomycin level on day 3 of therapy with AM labs Plan to re-dose vancomycin when level <20 mcg/mL  Will need to follow up on dialysis plans (peritoneal vs HD). No nephrology note available at this time.   Height: 6' (182.9 cm) Weight: (!) 365 lb (165.6 kg) IBW/kg (Calculated) : 77.6  ABW 112.8 kg  Temp (24hrs), Avg:98.9 F (37.2 C), Min:98.9 F (37.2 C), Max:98.9 F (37.2 C)   Recent Labs Lab 05/11/16 0527  WBC 7.5  CREATININE 11.68*    Estimated Creatinine Clearance: 11.1 mL/min (by C-G formula based on SCr of 11.68 mg/dL (H)).    Allergies  Allergen Reactions  . Ramipril Shortness Of Breath  . Aspirin     Other reaction(s): Unknown kidney disease    Antimicrobials this admission: ceftriaxone 12/28 >> Vancomycin 12/28 >>  Dose adjustments this admission:  Microbiology results:  BCx: ordered Body fluid cx sent  Thank you for allowing pharmacy to be a part of this patient's care.  Marty Heck 05/11/2016 2:25 PM

## 2016-05-11 NOTE — H&P (Signed)
[facility] [place] [facility]   PATIENT NAME: Justin Fitzgerald    MR#:  [id number]  DATE OF BIRTH:  [birth year]  DATE OF ADMISSION:  05/11/2016  PRIMARY CARE PHYSICIAN: Dorothey Baseman, MD   REQUESTING/REFERRING PHYSICIAN: Dr Derrill Kay  CHIEF COMPLAINT:   Chief Complaint  Patient presents with  . Fever    Pt. here via EMS from home for cough, chills and fever.    HISTORY OF PRESENT ILLNESS:  Justin Fitzgerald  is a 55 y.o. male with a known history of End-stage renal disease on peritoneal dialysis came in for fever at home cough and chills. He states that his left arm has been shaking. He also had some abdominal pain yesterday but seems will bit better today. He points to his catheter and around his hernia site on where his abdominal pain was. No nausea or vomiting or diarrhea. In the ER he was found to have a borderline troponin. He was also found to have a low oxygen saturation as per the ER physician and placed on oxygen. He was started on steroids for COPD exacerbation. Hospitalist services contacted for further evaluation. No complaints of chest pain.  PAST MEDICAL HISTORY:   Past Medical History:  Diagnosis Date  . Chronic kidney disease   . Diabetes mellitus without complication (HCC)   . Hyperlipidemia   . Hypertension   . Peripheral vascular disease (HCC)     PAST SURGICAL HISTORY:   Past Surgical History:  Procedure Laterality Date  . arm surgery Right   . EXTERIORIZATION OF A CONTINUOUS AMBULATORY PERITONEAL DIALYSIS CATHETER    . PERIPHERAL VASCULAR CATHETERIZATION N/A 03/08/2016   Procedure: Dialysis/Perma Catheter Insertion;  Surgeon: Renford Dills, MD;  Location: [facility] INVASIVE CV LAB;  Service: Cardiovascular;  Laterality: N/A;  . PERIPHERAL VASCULAR CATHETERIZATION N/A 04/20/2016   Procedure: Dialysis/Perma Catheter Removal;  Surgeon: Annice Needy, MD;  Location: [facility] INVASIVE CV LAB;  Service: Cardiovascular;  Laterality: N/A;   . WRIST SURGERY Right     SOCIAL HISTORY:   Social History  Substance Use Topics  . Smoking status: Current Every Day Smoker    Packs/day: 1.00    Types: Cigarettes  . Smokeless tobacco: Current User  . Alcohol use No    FAMILY HISTORY:   Family History  Problem Relation Age of Onset  . Hypertension Mother   . CAD Mother     DRUG ALLERGIES:   Allergies  Allergen Reactions  . Ramipril Shortness Of Breath  . Aspirin     Other reaction(s): Unknown kidney disease    REVIEW OF SYSTEMS:  CONSTITUTIONAL: Positive for fever, chills and fatigue.  EYES: No blurred or double vision. Wears glasses EARS, NOSE, AND THROAT: No tinnitus or ear pain. No sore throat. Positive for runny nose RESPIRATORY: Positive for cough and wheeze. No shortness of breath, or hemoptysis.  CARDIOVASCULAR: No chest pain, positive for edema.  GASTROINTESTINAL: No nausea, vomiting, diarrhea. Positive for abdominal pain. No blood in bowel movements GENITOURINARY: Does not urinate much ENDOCRINE: No polyuria, nocturia,  HEMATOLOGY: No anemia, easy bruising or bleeding SKIN: No rash or lesion. MUSCULOSKELETAL: Positive for leg pain.   NEUROLOGIC: No tingling, numbness, weakness.  PSYCHIATRY: No anxiety or depression.   MEDICATIONS AT HOME:   Prior to Admission medications   Medication Sig Start Date End Date Taking? Authorizing Provider  folic acid-vitamin b complex-vitamin c-selenium-zinc (DIALYVITE) 3 MG TABS tablet Take 1 tablet by mouth daily.    Historical Provider, MD  furosemide (LASIX) 40 MG tablet Take 80 mg by mouth 2 (two) times daily.  01/27/16 01/26/17  Historical Provider, MD  gabapentin (NEURONTIN) 300 MG capsule Take 300 mg by mouth at bedtime.  02/08/16   Historical Provider, MD  metolazone (ZAROXOLYN) 2.5 MG tablet Take 2.5-5 mg by mouth daily. 02/25/16   Historical Provider, MD  metoprolol succinate (TOPROL-XL) 100 MG 24 hr tablet Take 100 mg by mouth daily.  02/23/16   Historical  Provider, MD  pioglitazone (ACTOS) 45 MG tablet TAKE 1 BY MOUTH ONCE A DAY 06/10/15   Historical Provider, MD  SPIRIVA HANDIHALER 18 MCG inhalation capsule Place 18 mcg into inhaler and inhale daily.  02/21/16   Historical Provider, MD      VITAL SIGNS:  Blood pressure (!) 100/58, pulse (!) 106, temperature 98.9 F (37.2 C), temperature source Oral, resp. rate 18, height 6' (1.829 m), weight (!) 165.6 kg (365 lb), SpO2 94 %.  PHYSICAL EXAMINATION:  GENERAL:  55 y.o.-year-old patient lying in the bed with no acute distress.  EYES: Pupils equal, round, reactive to light and accommodation. No scleral icterus. Extraocular muscles intact.  HEENT: Head atraumatic, normocephalic. Oropharynx and nasopharynx clear.  NECK:  Supple, no jugular venous distention. No thyroid enlargement, no tenderness.  LUNGS: Decreased breath sounds bilaterally, positive wheezing. No use of accessory muscles of respiration.  CARDIOVASCULAR: S1, S2 normal tachycardia. Positive 3/6 systolic murmurs. No rubs, or gallops.  ABDOMEN: Soft, tender around the catheter site and hernia site, nondistended. Bowel sounds present. No organomegaly or mass.  EXTREMITIES: 2+ edema, no cyanosis, or clubbing.  NEUROLOGIC: Cranial nerves II through XII are intact. Muscle strength 5/5 in all extremities. Sensation intact. Gait not checked.  PSYCHIATRIC: The patient is alert and oriented x 3.  SKIN: No rash, lesion, or ulcer.   LABORATORY PANEL:   CBC  Recent Labs Lab 05/11/16 0527  WBC 7.5  HGB 9.9*  HCT 29.7*  PLT 225   ------------------------------------------------------------------------------------------------------------------  Chemistries   Recent Labs Lab 05/11/16 0527  NA 132*  K 4.6  CL 91*  CO2 27  GLUCOSE 101*  BUN 60*  CREATININE 11.68*  CALCIUM 9.1   ------------------------------------------------------------------------------------------------------------------  Cardiac Enzymes  Recent Labs Lab  05/11/16 0831  TROPONINI 0.38*   ------------------------------------------------------------------------------------------------------------------  RADIOLOGY:  Dg Chest Port 1 View  Result Date: 05/11/2016 CLINICAL DATA:  Cough and chills EXAM: PORTABLE CHEST 1 VIEW COMPARISON:  None. FINDINGS: Cardiac silhouette is enlarged with atherosclerotic calcification in the aortic arch. There are diffuse mild interstitial opacities. There is no pneumothorax or sizable pleural effusion. No lobar consolidation. IMPRESSION: Cardiomegaly, aortic atherosclerosis without overt pulmonary edema. Electronically Signed   By: Deatra Robinson M.D.   On: 05/11/2016 05:59    EKG:   Sinus tachycardia 105 bpm  IMPRESSION AND PLAN:   1. Acute hypoxic respiratory failure. ER physician documented pulse ox 80%. Start oxygen supplementation. 2. COPD exacerbation. Start IV Solu-Medrol budesonide and DuoNeb nebulizers. Send procalcitonin and influenza swab 3. Abdominal pain with fever and chills. Case discussed with nephrology Dr. Luther Redo to send off cultures from peritoneal catheter to rule out peritonitis. Hold antibiotics until results of this are back. Obtain blood cultures. 4. Tobacco abuse smoking cessation counseling done 3 minutes by me. 5. End-stage renal disease on peritoneal dialysis. Orders as per nephrology 6. Hypotension and tachycardia restart lower dose Toprol-XL 7. Lower extremity edema on Lasix and metolazone. 8. Right lower extremity pain. 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 May benefit  from Plavix but I want to make sure no procedures need to be done prior to starting. 9. Weakness physical therapy evaluation 10. Elevated troponin this is demand ischemia from hypoxia and COPD exacerbation and also could be elevated because of end-stage renal disease. No further cardiac workup.    All the records are reviewed and case discussed with ED provider. Management plans discussed with the  patient, family and they are in agreement.  CODE STATUS: Full code  TOTAL TIME TAKING CARE OF THIS PATIENT: 55 minutes.    Alford HighlandWIETING, Tayonna Bacha M.D on 05/11/2016 at 10:15 AM  Between 7am to 6pm - Pager - [phone]  After 6pm call admission pager [phone]  [facility] Office  [phone]  CC: Primary care physician; Dorothey BasemanAVID BRONSTEIN, MD

## 2016-05-11 NOTE — ED Notes (Signed)
Notified Secretary for bed control to assign bed

## 2016-05-11 NOTE — ED Notes (Signed)
Pt. Is a peritoneal dialysis patient.  Pt. Reports pain in abdominal area.  Pt. States he had peritoneal dialysis where 5 of 6 bags were pulled from patient.  Pt. Reports cough that started two  Days ago.  Pt. Reports chills tonight.  Pt. Denies vomiting or diarrhea.

## 2016-05-11 NOTE — Progress Notes (Signed)
Chaplain received an order to visit with pt in room 221. Provided the ministry of prayer and a spiritual presence.    05/11/16 1746  Clinical Encounter Type  Visited With Patient  Visit Type Initial;Spiritual support  Referral From Nurse  Consult/Referral To Chaplain  Spiritual Encounters  Spiritual Needs Prayer

## 2016-05-11 NOTE — ED Notes (Signed)
Patient getting anxious wanting to know that the plan is and his oxygen is bothering him. Dr Derrill Kay notified and went in to see patient. Removed O2.

## 2016-05-11 NOTE — ED Notes (Signed)
Updated wife that we are still waiting on a bed.

## 2016-05-11 NOTE — ED Triage Notes (Signed)
Pt. Is a peritoneal dialysis patient.  Pt. Reports cough for the past two days.  Pt. Reports chills that started tonight.  Pt. States pain to abdominal area.  Pt. Reports having hernia.

## 2016-05-11 NOTE — ED Notes (Signed)
Patient's wife is leaving for an hour. Please call her if he goes to the floor before she returns. Justin Fitzgerald [phone].

## 2016-05-11 NOTE — ED Notes (Signed)
SPO2 is between 88-90% on Room air. Dr Derrill Kay notified. Placed back on 2L [place]. Patient now 94%.

## 2016-05-11 NOTE — Progress Notes (Signed)
Patient ID: Justin Fitzgerald, male   DOB: Nov 23, 1960, 55 y.o.   MRN: [id number] Called by ER that no admission order placed. I placed initial order at before 10am. Placed another admission order  IV abx ordered with peritonitis  Dr Renae Gloss

## 2016-05-11 NOTE — ED Provider Notes (Signed)
[facility] Provider Note   First MD Initiated Contact with Patient 05/11/16 0515     (approximate)  I have reviewed the triage vital signs and the nursing notes.   HISTORY  Chief Complaint Fever (Pt. here via EMS from home for cough, chills and fever.)    HPI Justin Fitzgerald is a 55 y.o. male listed below chronic medical conditions as well as COPD presents to the emergency department with nonproductive cough and congestion 2 days associated with chills and subjective fevers. Patient denies any chest pain. Patient denies any worsening lower extremity pain or swelling Patient afebrile on presentation with temperature 98.9. In addition the patient admits to umbilical discomfort in the area where he has a known hernia which started tonight. She denies any constipation nausea or vomiting.   Past Medical History:  Diagnosis Date  . Chronic kidney disease   . Diabetes mellitus without complication (HCC)   . Hyperlipidemia   . Hypertension   . Peripheral vascular disease [facility])     Patient Active Problem List   Diagnosis Date Noted  . End stage renal disease (HCC) 03/27/2016  . Complication from renal dialysis device 03/27/2016  . Chronic renal insufficiency, stage 4 (severe) (HCC) 02/28/2016  . Lymphedema 02/28/2016  . Essential hypertension 02/28/2016  . Type 2 diabetes mellitus (HCC) 02/28/2016    Past Surgical History:  Procedure Laterality Date  . arm surgery Right   . EXTERIORIZATION OF A CONTINUOUS AMBULATORY PERITONEAL DIALYSIS CATHETER    . PERIPHERAL VASCULAR CATHETERIZATION N/A 03/08/2016   Procedure: Dialysis/Perma Catheter Insertion;  Surgeon: Renford Dills, MD;  Location: [facility] INVASIVE CV LAB;  Service: Cardiovascular;  Laterality: N/A;  . PERIPHERAL VASCULAR CATHETERIZATION N/A 04/20/2016   Procedure: Dialysis/Perma Catheter Removal;  Surgeon: Annice Needy, MD;  Location: [facility] INVASIVE CV LAB;  Service:  Cardiovascular;  Laterality: N/A;  . WRIST SURGERY Right     Prior to Admission medications   Medication Sig Start Date End Date Taking? Authorizing Provider  folic acid-vitamin b complex-vitamin c-selenium-zinc (DIALYVITE) 3 MG TABS tablet Take 1 tablet by mouth daily.    Historical Provider, MD  furosemide (LASIX) 40 MG tablet Take 80 mg by mouth 2 (two) times daily.  01/27/16 01/26/17  Historical Provider, MD  gabapentin (NEURONTIN) 300 MG capsule Take 300 mg by mouth at bedtime.  02/08/16   Historical Provider, MD  metolazone (ZAROXOLYN) 2.5 MG tablet Take 2.5-5 mg by mouth daily. 02/25/16   Historical Provider, MD  metoprolol succinate (TOPROL-XL) 100 MG 24 hr tablet Take 100 mg by mouth daily.  02/23/16   Historical Provider, MD  pioglitazone (ACTOS) 45 MG tablet TAKE 1 BY MOUTH ONCE A DAY 06/10/15   Historical Provider, MD  SPIRIVA HANDIHALER 18 MCG inhalation capsule Place 18 mcg into inhaler and inhale daily.  02/21/16   Historical Provider, MD    Allergies Ramipril and Aspirin  Family History  Problem Relation Age of Onset  . Hypertension Mother     Social History Social History  Substance Use Topics  . Smoking status: Current Every Day Smoker    Packs/day: 0.50    Types: Cigarettes  . Smokeless tobacco: Current User  . Alcohol use No    Review of Systems Constitutional: Positive for fever/chills Eyes: No visual changes. ENT: No sore throat. Cardiovascular: Denies chest pain. Respiratory: Denies shortness of breath.Positive for cough Gastrointestinal: Positive for abdominal pain.  No nausea, no vomiting.  No diarrhea.  No constipation.  Genitourinary: Negative for dysuria. Musculoskeletal: Negative for back pain. Skin: Negative for rash. Neurological: Negative for headaches, focal weakness or numbness.  10-point ROS otherwise negative.  ____________________________________________   PHYSICAL EXAM:  VITAL SIGNS: ED Triage Vitals  Enc Vitals Group     BP  05/11/16 0530 115/64     Pulse Rate 05/11/16 0509 (!) 108     Resp 05/11/16 0509 19     Temp 05/11/16 0509 98.9 F (37.2 C)     Temp Source 05/11/16 0509 Oral     SpO2 05/11/16 0509 96 %     Weight 05/11/16 0511 (!) 365 lb (165.6 kg)     Height 05/11/16 0511 6' (1.829 m)     Head Circumference --      Peak Flow --      Pain Score --      Pain Loc --      Pain Edu? --      Excl. in GC? --     Constitutional: Alert and oriented. Well appearing and in no acute distress. Eyes: Conjunctivae are normal. PERRL. EOMI. Head: Atraumatic. Nose: No congestion/rhinnorhea. Mouth/Throat: Mucous membranes are moist.  Oropharynx non-erythematous. Neck: No stridor. Cardiovascular: Normal rate, regular rhythm. Good peripheral circulation. Grossly normal heart sounds. Respiratory: Normal respiratory effort.  No retractions. Lungs CTAB. Gastrointestinal: Soft and nontender. No distention.  Musculoskeletal: No lower extremity tenderness nor edema. No gross deformities of extremities. Neurologic:  Normal speech and language. No gross focal neurologic deficits are appreciated.  Skin:  Skin is warm, dry and intact. No rash noted. Psychiatric: Mood and affect are normal. Speech and behavior are normal.  ____________________________________________   LABS (all labs ordered are listed, but only abnormal results are displayed)  Labs Reviewed  BASIC METABOLIC PANEL - Abnormal; Notable for the following:       Result Value   Sodium 132 (*)    Chloride 91 (*)    Glucose, Bld 101 (*)    BUN 60 (*)    Creatinine, Ser 11.68 (*)    GFR calc non Af Amer 4 (*)    GFR calc Af Amer 5 (*)    All other components within normal limits  CBC - Abnormal; Notable for the following:    RBC 3.29 (*)    Hemoglobin 9.9 (*)    HCT 29.7 (*)    RDW 16.9 (*)    All other components within normal limits  TROPONIN I - Abnormal; Notable for the following:    Troponin I 0.40 (*)    All other components within normal  limits   ____________________________________________  EKG  ED ECG REPORT I, [place] N Kemp Gomes, the attending physician, personally viewed and interpreted this ECG.   Date: 05/11/2016  EKG Time: 2:20 AM  Rate: 114  Rhythm: Sinus tachycardia  Axis: Normal  Intervals: Normal  ST&T Change: None  ____________________________________________  RADIOLOGY I,  N Myranda Pavone, personally viewed and evaluated these images (plain radiographs) as part of my medical decision making, as well as reviewing the written report by the radiologist.  Dg Chest Port 1 View  Result Date: 05/11/2016 CLINICAL DATA:  Cough and chills EXAM: PORTABLE CHEST 1 VIEW COMPARISON:  None. FINDINGS: Cardiac silhouette is enlarged with atherosclerotic calcification in the aortic arch. There are diffuse mild interstitial opacities. There is no pneumothorax or sizable pleural effusion. No lobar consolidation. IMPRESSION: Cardiomegaly, aortic atherosclerosis without overt pulmonary edema. Electronically Signed   By: Deatra Robinson M.D.   On: 05/11/2016 05:59  Procedures    INITIAL IMPRESSION / ASSESSMENT AND PLAN / ED COURSE  Pertinent labs & imaging results that were available during my care of the patient were reviewed by me and considered in my medical decision making (see chart for details).  History physical exam consistent with possible acute bronchitis however patient's laboratory data revealed a troponin 0.40 no EKG findings of ischemia or infarction patient denies having any chest pain. Patient's care transferred to Dr. Derrill KayGoodman for repeat troponin and possible admission   Clinical Course     ____________________________________________  FINAL CLINICAL IMPRESSION(S) / ED DIAGNOSES  Final diagnoses:  Elevated troponin  Hypoxia     MEDICATIONS GIVEN DURING THIS VISIT:  Medications  ipratropium-albuterol (DUONEB) 0.5-2.5 (3) MG/3ML nebulizer solution 3 mL (3 mLs Nebulization Given 05/11/16  0634)  chlorpheniramine-HYDROcodone (TUSSIONEX) 10-8 MG/5ML suspension 5 mL (5 mLs Oral Given 05/11/16 0535)  methylPREDNISolone sodium succinate (SOLU-MEDROL) 125 mg/2 mL injection 125 mg (125 mg Intravenous Given 05/11/16 0559)     NEW OUTPATIENT MEDICATIONS STARTED DURING THIS VISIT:  New Prescriptions   No medications on file    Modified Medications   No medications on file    Discontinued Medications   No medications on file     Note:  This document was prepared using Dragon voice recognition software and may include unintentional dictation errors.    Darci Currentandolph N Derreck Wiltsey, MD 05/12/16 [phone]

## 2016-05-11 NOTE — ED Provider Notes (Signed)
-----------------------------------------   9:32 AM on 05/11/2016 -----------------------------------------  Second troponin continues to be significantly elevated. At this point I do not have a good explanation for the elevation of the troponin. We do not have any previous troponins in our system. It is higher than I would expect for simply baseline due to kidney disease. Additionally patient desated into the 80s again on room air. I have contacted the hospitalist service for admission.   Phineas Semen, MD 05/11/16 [phone]

## 2016-05-12 DIAGNOSIS — R0902 Hypoxemia: Secondary | ICD-10-CM | POA: Diagnosis not present

## 2016-05-12 DIAGNOSIS — K659 Peritonitis, unspecified: Secondary | ICD-10-CM | POA: Diagnosis not present

## 2016-05-12 LAB — BLOOD CULTURE ID PANEL (REFLEXED)
Acinetobacter baumannii: NOT DETECTED
CANDIDA GLABRATA: NOT DETECTED
CANDIDA KRUSEI: NOT DETECTED
Candida albicans: NOT DETECTED
Candida parapsilosis: NOT DETECTED
Candida tropicalis: NOT DETECTED
ENTEROCOCCUS SPECIES: NOT DETECTED
ESCHERICHIA COLI: NOT DETECTED
Enterobacter cloacae complex: NOT DETECTED
Enterobacteriaceae species: NOT DETECTED
Haemophilus influenzae: NOT DETECTED
KLEBSIELLA PNEUMONIAE: NOT DETECTED
Klebsiella oxytoca: NOT DETECTED
LISTERIA MONOCYTOGENES: NOT DETECTED
Methicillin resistance: NOT DETECTED
NEISSERIA MENINGITIDIS: NOT DETECTED
PROTEUS SPECIES: NOT DETECTED
Pseudomonas aeruginosa: NOT DETECTED
SERRATIA MARCESCENS: NOT DETECTED
STAPHYLOCOCCUS AUREUS BCID: NOT DETECTED
STAPHYLOCOCCUS SPECIES: DETECTED — AB
STREPTOCOCCUS AGALACTIAE: NOT DETECTED
Streptococcus pneumoniae: NOT DETECTED
Streptococcus pyogenes: NOT DETECTED
Streptococcus species: NOT DETECTED

## 2016-05-12 LAB — GLUCOSE, CAPILLARY: Glucose-Capillary: 148 mg/dL — ABNORMAL HIGH (ref 65–99)

## 2016-05-12 LAB — BASIC METABOLIC PANEL
Anion gap: 15 (ref 5–15)
BUN: 67 mg/dL — AB (ref 6–20)
CALCIUM: 10.1 mg/dL (ref 8.9–10.3)
CHLORIDE: 93 mmol/L — AB (ref 101–111)
CO2: 28 mmol/L (ref 22–32)
CREATININE: 11.38 mg/dL — AB (ref 0.61–1.24)
GFR calc Af Amer: 5 mL/min — ABNORMAL LOW (ref >60)
GFR calc non Af Amer: 4 mL/min — ABNORMAL LOW (ref >60)
Glucose, Bld: 158 mg/dL — ABNORMAL HIGH (ref 65–99)
Potassium: 4.9 mmol/L (ref 3.5–5.1)
SODIUM: 136 mmol/L (ref 135–145)

## 2016-05-12 LAB — CBC
HCT: 29 % — ABNORMAL LOW (ref 40.0–52.0)
Hemoglobin: 9.6 g/dL — ABNORMAL LOW (ref 13.0–18.0)
MCH: 29.8 pg (ref 26.0–34.0)
MCHC: 32.9 g/dL (ref 32.0–36.0)
MCV: 90.4 fL (ref 80.0–100.0)
PLATELETS: 271 10*3/uL (ref 150–440)
RBC: 3.21 MIL/uL — ABNORMAL LOW (ref 4.40–5.90)
RDW: 16.7 % — AB (ref 11.5–14.5)
WBC: 9.7 10*3/uL (ref 3.8–10.6)

## 2016-05-12 MED ORDER — BECLOMETHASONE DIPROPIONATE 40 MCG/ACT IN AERS: 1.0000 | INHALATION_SPRAY | Freq: Two times a day (BID) | RESPIRATORY_TRACT | 0 refills | Status: DC

## 2016-05-12 MED ORDER — CEPHALEXIN 250 MG PO CAPS: 250.0000 mg | ORAL_CAPSULE | Freq: Two times a day (BID) | ORAL | 0 refills | Status: DC

## 2016-05-12 MED ORDER — SEVELAMER CARBONATE 2.4 G PO PACK: 2.4000 g | PACK | Freq: Three times a day (TID) | ORAL | 0 refills | Status: AC

## 2016-05-12 MED ORDER — FUROSEMIDE 40 MG PO TABS: 80.0000 mg | ORAL_TABLET | Freq: Every day | ORAL | 0 refills | Status: DC

## 2016-05-12 MED ORDER — NICOTINE 21 MG/24HR TD PT24: 21.0000 mg | MEDICATED_PATCH | Freq: Every day | TRANSDERMAL | 0 refills | Status: DC

## 2016-05-12 MED ORDER — CINACALCET HCL 30 MG PO TABS: 120.0000 mg | ORAL_TABLET | Freq: Every day | ORAL | 0 refills | Status: DC

## 2016-05-12 MED ORDER — CIPROFLOXACIN HCL 500 MG PO TABS: 500.0000 mg | ORAL_TABLET | Freq: Every day | ORAL | 0 refills | Status: DC

## 2016-05-12 MED ORDER — PREDNISONE 5 MG PO TABS: ORAL_TABLET | ORAL | 0 refills | Status: DC

## 2016-05-12 NOTE — Progress Notes (Signed)
Patient ID: Justin Fitzgerald, male   DOB: Feb 06, 1961, 55 y.o.   MRN: [id number]  Patient feels well and wants to go home Case discussed with Nephrology and will send home on keflex and cipro. Patient will stay for iv ceftazidime now I explained that I do not have culture results at this point and I dont know exactly what I am treating, he wants to go home anyway.  Dr Alford Highland

## 2016-05-12 NOTE — Progress Notes (Signed)
[facility]  ROUNDING NOTE   Subjective:   Patient demanding to go home. Seen and examined on peritoneal dialysis. Tolerating treatment well.  Wife at bedside.   UF 693 mL - 2.5% dextrose  Afebrile, nontender abdomen  Objective:  Vital signs in last 24 hours:  Temp:  [97.5 F (36.4 C)-98.2 F (36.8 C)] 98.2 F (36.8 C) (12/29 0842) Pulse Rate:  [76-85] 84 (12/29 0842) Resp:  [13-18] 18 (12/29 0842) BP: (102-135)/(48-79) 117/57 (12/29 0842) SpO2:  [91 %-100 %] 93 % (12/29 0842)  Weight change:  Filed Weights   05/11/16 0511  Weight: (!) 165.6 kg (365 lb)    Intake/Output: I/O last 3 completed shifts: In: 550 [IV Piggyback:550] Out: 450 [Urine:450]   Intake/Output this shift:  No intake/output data recorded.  Physical Exam: General: NAD, sitting up with PD treatment  Head: Normocephalic, atraumatic. Moist oral mucosal membranes  Eyes: Anicteric, PERRL  Neck: Supple, trachea midline  Lungs:  Clear to auscultation  Heart: Regular rate and rhythm  Abdomen:  Soft, nontender, obese  Extremities:  1+ peripheral edema.  Neurologic: Nonfocal, moving all four extremities  Skin: No lesions  Access: PD catheter    Basic Metabolic Panel:  Recent Labs Lab 05/11/16 0527 05/12/16 0444  NA 132* 136  K 4.6 4.9  CL 91* 93*  CO2 27 28  GLUCOSE 101* 158*  BUN 60* 67*  CREATININE 11.68* 11.38*  CALCIUM 9.1 10.1    Liver Function Tests: No results for input(s): AST, ALT, ALKPHOS, BILITOT, PROT, ALBUMIN in the last 168 hours. No results for input(s): LIPASE, AMYLASE in the last 168 hours. No results for input(s): AMMONIA in the last 168 hours.  CBC:  Recent Labs Lab 05/11/16 0527 05/12/16 0444  WBC 7.5 9.7  HGB 9.9* 9.6*  HCT 29.7* 29.0*  MCV 90.2 90.4  PLT 225 271    Cardiac Enzymes:  Recent Labs Lab 05/11/16 0527 05/11/16 0831 05/11/16 1728  TROPONINI 0.40* 0.38* 0.76*    BNP: Invalid input(s): POCBNP  CBG:  Recent Labs Lab  05/11/16 1751 05/11/16 2138 05/12/16 0747  GLUCAP 128* 147* 148*    Microbiology: Results for orders placed or performed during the hospital encounter of 05/11/16  Body fluid culture     Status: None (Preliminary result)   Collection Time: 05/11/16 11:45 AM  Result Value Ref Range Status   Specimen Description PERITONEAL  Final   Special Requests PERITONEAL DIALYSIS  Final   Gram Stain   Final    MODERATE WBC PRESENT, PREDOMINANTLY PMN NO ORGANISMS SEEN Performed at [facility]    Culture PENDING  Incomplete   Report Status PENDING  Incomplete  CULTURE, BLOOD (ROUTINE X 2) w Reflex to ID Panel     Status: None (Preliminary result)   Collection Time: 05/11/16  2:24 PM  Result Value Ref Range Status   Specimen Description BLOOD LEFT HAND  Final   Special Requests   Final    BOTTLES DRAWN AEROBIC AND ANAEROBIC AER ANA   Culture NO GROWTH < 24 HOURS  Final   Report Status PENDING  Incomplete  CULTURE, BLOOD (ROUTINE X 2) w Reflex to ID Panel     Status: None (Preliminary result)   Collection Time: 05/11/16  2:24 PM  Result Value Ref Range Status   Specimen Description BLOOD RIGHT AC  Final   Special Requests   Final    BOTTLES DRAWN AEROBIC AND ANAEROBIC AER ANA   Culture  NO GROWTH < 24 HOURS  Final   Report Status PENDING  Incomplete    Coagulation Studies: No results for input(s): LABPROT, INR in the last 72 hours.  Urinalysis: No results for input(s): COLORURINE, LABSPEC, PHURINE, GLUCOSEU, HGBUR, BILIRUBINUR, KETONESUR, PROTEINUR, UROBILINOGEN, NITRITE, LEUKOCYTESUR in the last 72 hours.  Invalid input(s): APPERANCEUR    Imaging: Dg Chest Port 1 View  Result Date: 05/11/2016 CLINICAL DATA:  Cough and chills EXAM: PORTABLE CHEST 1 VIEW COMPARISON:  None. FINDINGS: Cardiac silhouette is enlarged with atherosclerotic calcification in the aortic arch. There are diffuse mild interstitial opacities. There is no pneumothorax or sizable pleural  effusion. No lobar consolidation. IMPRESSION: Cardiomegaly, aortic atherosclerosis without overt pulmonary edema. Electronically Signed   By: Deatra RobinsonKevin  Herman M.D.   On: 05/11/2016 05:59     Medications:    . budesonide (PULMICORT) nebulizer solution  0.5 mg Nebulization BID  . cefTAZidime (FORTAZ)  IV  500 mg Intravenous Q24H  . cinacalcet  120 mg Oral Q supper  . dialysis solution 2.5% low-MG/low-CA   Intraperitoneal 5 X Daily  . furosemide  80 mg Oral BID  . gabapentin  300 mg Oral QHS  . gentamicin cream  1 application Topical Daily  . heparin  5,000 Units Subcutaneous Q8H  . insulin aspart  0-5 Units Subcutaneous QHS  . insulin aspart  0-9 Units Subcutaneous TID WC  . ipratropium-albuterol  3 mL Nebulization TID  . methylPREDNISolone (SOLU-MEDROL) injection  60 mg Intravenous Q12H  . metolazone  2.5 mg Oral Daily  . metoprolol succinate  50 mg Oral Daily  . nicotine  21 mg Transdermal Daily  . sevelamer carbonate  2.4 g Oral TID WC  . sodium chloride flush  3 mL Intravenous Q12H   acetaminophen **OR** acetaminophen  Assessment/ Plan:  [name] is a 55 y.o. black male with End Stage Renal Disease on peritoneal dialysis, hypertension, diabetes mellitus type II, chronic lymphedema, peripheral vascular disease  PD [facility]  1. End Stage Renal Disease on peritoneal dialysis: with active peritonitis. Most recent adequacy not at goal.  Residual urine output: 450mL PD prescription 11 hours with 6 exchanges 3 litre fills - Dialysis tonight, if here - Use 2.5% dextrose - Discussed with patient and family about transitioning to hemodialysis.   2. Peritonitis: cultures pending. Empiric ceftazidime and vancomycin.  - transition to PO keflex and cipro  3. Hypertension: at goal. - metolazone, furosemide and metoprolol .   4. Anemia of chronic kidney disease: hemoglobin 9.6.  - EPO as outpatient.   5. Secondary Hyperparathyroidism: with  hyperphosphatemia - cinacalcet 120mg  daily - sevelamer powder 2.4gm with meals.   6. Diabetes mellitus type II with chronic kidney disease: on Actos as outpatient Hemoglobin A1c 5.4% in October.    LOS: 1 Justin Fitzgerald 12/29/201711:20 AM

## 2016-05-12 NOTE — Progress Notes (Signed)
05/12/2016 11:31 AM  BP (!) 117/57 (BP Location: Left Arm)   Pulse 84   Temp 98.2 F (36.8 C) (Oral)   Resp 18   Ht 6' (1.829 m)   Wt (!) 165.6 kg (365 lb)   SpO2 93%   BMI 49.50 kg/m  Patient discharged per MD orders. Discharge instructions reviewed with patient and patient verbalized understanding. IV removed per policy. Prescriptions discussed and given to patient. Discharged via wheelchair escorted by auxilary.  Ron Parker, RN

## 2016-05-12 NOTE — Progress Notes (Signed)
PHARMACY - PHYSICIAN COMMUNICATION CRITICAL VALUE ALERT - BLOOD CULTURE IDENTIFICATION (BCID)  Results for orders placed or performed during the hospital encounter of 05/11/16  Blood Culture ID Panel (Reflexed) (Collected: 05/11/2016  2:24 PM)  Result Value Ref Range   Enterococcus species NOT DETECTED NOT DETECTED   Listeria monocytogenes NOT DETECTED NOT DETECTED   Staphylococcus species DETECTED (A) NOT DETECTED   Staphylococcus aureus NOT DETECTED NOT DETECTED   Methicillin resistance NOT DETECTED NOT DETECTED   Streptococcus species NOT DETECTED NOT DETECTED   Streptococcus agalactiae NOT DETECTED NOT DETECTED   Streptococcus pneumoniae NOT DETECTED NOT DETECTED   Streptococcus pyogenes NOT DETECTED NOT DETECTED   Acinetobacter baumannii NOT DETECTED NOT DETECTED   Enterobacteriaceae species NOT DETECTED NOT DETECTED   Enterobacter cloacae complex NOT DETECTED NOT DETECTED   Escherichia coli NOT DETECTED NOT DETECTED   Klebsiella oxytoca NOT DETECTED NOT DETECTED   Klebsiella pneumoniae NOT DETECTED NOT DETECTED   Proteus species NOT DETECTED NOT DETECTED   Serratia marcescens NOT DETECTED NOT DETECTED   Haemophilus influenzae NOT DETECTED NOT DETECTED   Neisseria meningitidis NOT DETECTED NOT DETECTED   Pseudomonas aeruginosa NOT DETECTED NOT DETECTED   Candida albicans NOT DETECTED NOT DETECTED   Candida glabrata NOT DETECTED NOT DETECTED   Candida krusei NOT DETECTED NOT DETECTED   Candida parapsilosis NOT DETECTED NOT DETECTED   Candida tropicalis NOT DETECTED NOT DETECTED    Name of physician (or Provider) Contacted: [place] / [place]  Changes to prescribed antibiotics required:  Spoke with Dr Renae Gloss , pt went home on cephalexin b/c he wanted to leave before cultures where back.  Dr Renae Gloss wants [place] to contact Dr Wynelle Link on 12/30 to inform him of BCID results.   Ted Leonhart D 05/12/2016  3:00 PM

## 2016-05-12 NOTE — Discharge Summary (Signed)
[facility] - [place] [facility]   PATIENT NAME: Justin Fitzgerald    MR#:  [id number]  DATE OF BIRTH:  26-Oct-1960  DATE OF ADMISSION:  05/11/2016 ADMITTING PHYSICIAN: Alford Highlandichard Marilene Vath, MD  DATE OF DISCHARGE: 05/12/2016 11:50 AM  PRIMARY CARE PHYSICIAN: Dorothey BasemanAVID BRONSTEIN, MD    ADMISSION DIAGNOSIS:  Hypoxia [R09.02] Elevated troponin [R74.8]  DISCHARGE DIAGNOSIS:  Active Problems:   Acute respiratory failure (HCC)   Peritonitis (HCC)   SECONDARY DIAGNOSIS:   Past Medical History:  Diagnosis Date  . Chronic kidney disease   . Diabetes mellitus without complication (HCC)   . Hyperlipidemia   . Hypertension   . Peripheral vascular disease (HCC)     HOSPITAL COURSE:   1. Acute hypoxic respiratory failure. ER physician documented a pulse ox of 80%. I started oxygen supplementation. On the morning of discharge patient is off oxygen. 2. COPD exacerbation. I started IV Solu-Medrol and budesonide and DuoNeb nebulizers. Influenza swab was negative. 3. Peritonitis with fever and chills and abdominal pain. I told the patient that I do not have the cultures back yet at this time. The patient once to leave the hospital at this point. The patient received vancomycin and Ceptazidime here. I prescribed Cipro and Keflex upon going home. The microbiologist will speak with Dr. Luther RedoKollaru tomorrow if any changes in medications need to be made in antibiotics. 4. End-stage renal disease on peritoneal dialysis. Patient received peritoneal dialysis overnight. Patient refused to go on hemodialysis. Follow-up with nephrology as outpatient. 5. Hypotension and tachycardia. Continue metoprolol Lasix and metolazone 6. Lower extremity edema on Lasix and metolazone 7. Lower extremity pain. 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 May benefit from Plavix as outpatient. I will let PMD or nephrology start this. 8. Weakness. I ordered physical therapy evaluation the patient left the  hospital prior. 9. Elevated Troponin. Demand ischemia from hypoxia and COPD exacerbation  Patient stated that he wanted to leave the hospital today. I would've rather kept him in the hospital until peritoneal cultures are fully resulted. Case discussed with Dr. Luther RedoKollaru will follow up these cultures with the microbiologist and adjust antibiotics if needed  DISCHARGE CONDITIONS:   Satisfactory  CONSULTS OBTAINED:   nephrology  DRUG ALLERGIES:   Allergies  Allergen Reactions  . Ramipril Shortness Of Breath  . Aspirin     Other reaction(s): Unknown kidney disease    DISCHARGE MEDICATIONS:   Discharge Medication List as of 05/12/2016 10:15 AM    START taking these medications   Details  beclomethasone (QVAR) 40 MCG/ACT inhaler Inhale 1 puff into the lungs 2 (two) times daily., Starting Fri 05/12/2016, Print    cephALEXin (KEFLEX) 250 MG capsule Take 1 capsule (250 mg total) by mouth 2 (two) times daily., Starting Fri 05/12/2016, Print    cinacalcet (SENSIPAR) 30 MG tablet Take 4 tablets (120 mg total) by mouth daily with supper., Starting Fri 05/12/2016, Print    ciprofloxacin (CIPRO) 500 MG tablet Take 1 tablet (500 mg total) by mouth daily., Starting Fri 05/12/2016, Print    nicotine (NICODERM CQ - DOSED IN MG/24 HOURS) 21 mg/24hr patch Place 1 patch (21 mg total) onto the skin daily., Starting Fri 05/12/2016, Print    predniSONE (DELTASONE) 5 MG tablet 4 tabs po day1; 3 tabs po day2; 2 tabs po day3; 1 tab po day4, Print    sevelamer carbonate (RENVELA) 2.4 g PACK Take 2.4 g by mouth 3 (three) times daily with meals., Starting Fri 05/12/2016, Print  CONTINUE these medications which have CHANGED   Details  furosemide (LASIX) 40 MG tablet Take 2 tablets (80 mg total) by mouth daily., Starting Fri 05/12/2016, Until Sat 05/12/2017, No Print      CONTINUE these medications which have NOT CHANGED   Details  gabapentin (NEURONTIN) 300 MG capsule Take 300 mg by mouth at  bedtime. , Starting Tue 02/08/2016, Historical Med    metolazone (ZAROXOLYN) 2.5 MG tablet Take 2.5-5 mg by mouth daily., Starting Fri 02/25/2016, Historical Med    metoprolol succinate (TOPROL-XL) 100 MG 24 hr tablet Take 100 mg by mouth daily. , Starting Wed 02/23/2016, Historical Med    SPIRIVA HANDIHALER 18 MCG inhalation capsule Place 18 mcg into inhaler and inhale daily. , Starting Mon 02/21/2016, Historical Med      STOP taking these medications     potassium chloride SA (K-DUR,KLOR-CON) 20 MEQ tablet      folic acid-vitamin b complex-vitamin c-selenium-zinc (DIALYVITE) 3 MG TABS tablet      pioglitazone (ACTOS) 45 MG tablet          DISCHARGE INSTRUCTIONS:  Follow-up with nephrology as outpatient Follow-up with PMD  If you experience worsening of your admission symptoms, develop shortness of breath, life threatening emergency, suicidal or homicidal thoughts you must seek medical attention immediately by calling 911 or calling your MD immediately  if symptoms less severe.  You Must read complete instructions/literature along with all the possible adverse reactions/side effects for all the Medicines you take and that have been prescribed to you. Take any new Medicines after you have completely understood and accept all the possible adverse reactions/side effects.   Please note  You were cared for by a hospitalist during your hospital stay. If you have any questions about your discharge medications or the care you received while you were in the hospital after you are discharged, you can call the unit and asked to speak with the hospitalist on call if the hospitalist that took care of you is not available. Once you are discharged, your primary care physician will handle any further medical issues. Please note that NO REFILLS for any discharge medications will be authorized once you are discharged, as it is imperative that you return to your primary care physician (or establish a  relationship with a primary care physician if you do not have one) for your aftercare needs so that they can reassess your need for medications and monitor your lab values.    Today   CHIEF COMPLAINT:   Chief Complaint  Patient presents with  . Fever    Pt. here via EMS from home for cough, chills and fever.    HISTORY OF PRESENT ILLNESS:  Justin Fitzgerald  is a 55 y.o. male brought in with cough, chills and fever and abdominal pain   VITAL SIGNS:  Blood pressure (!) 117/57, pulse 84, temperature 98.2 F (36.8 C), temperature source Oral, resp. rate 18, height 6' (1.829 m), weight (!) 165.6 kg (365 lb), SpO2 93 %.   PHYSICAL EXAMINATION:  GENERAL:  55 y.o.-year-old patient lying in the bed with no acute distress.  EYES: Pupils equal, round, reactive to light and accommodation. No scleral icterus. Extraocular muscles intact.  HEENT: Head atraumatic, normocephalic. Oropharynx and nasopharynx clear.  NECK:  Supple, no jugular venous distention. No thyroid enlargement, no tenderness.  LUNGS: Normal breath sounds bilaterally, no wheezing, rales,rhonchi or crepitation. No use of accessory muscles of respiration.  CARDIOVASCULAR: S1, S2 normal. No murmurs, rubs, or gallops.  ABDOMEN: Soft, non-tender, distended. Ventral hernia. Bowel sounds present. No organomegaly or mass.  EXTREMITIES: 2+  edema,  no cyanosis, or clubbing.  NEUROLOGIC: Cranial nerves II through XII are intact. Muscle strength 5/5 in all extremities. Sensation intact. Gait not checked.  PSYCHIATRIC: The patient is alert and oriented x 3.  SKIN: No obvious rash, lesion, or ulcer.   DATA REVIEW:   CBC  Recent Labs Lab 05/12/16 0444  WBC 9.7  HGB 9.6*  HCT 29.0*  PLT 271    Chemistries   Recent Labs Lab 05/12/16 0444  NA 136  K 4.9  CL 93*  CO2 28  GLUCOSE 158*  BUN 67*  CREATININE 11.38*  CALCIUM 10.1    Cardiac Enzymes  Recent Labs Lab 05/11/16 1728  TROPONINI 0.76*    Microbiology  Results  Results for orders placed or performed during the hospital encounter of 05/11/16  Body fluid culture     Status: None (Preliminary result)   Collection Time: 05/11/16 11:45 AM  Result Value Ref Range Status   Specimen Description PERITONEAL  Final   Special Requests PERITONEAL DIALYSIS  Final   Gram Stain   Final    MODERATE WBC PRESENT, PREDOMINANTLY PMN NO ORGANISMS SEEN    Culture   Final    MODERATE GRAM NEGATIVE RODS CRITICAL RESULT CALLED TO, READ BACK BY AND VERIFIED WITH: R WEITING,MD AT 1203 05/12/16 BY L BENFIELD Performed at [facility]    Report Status PENDING  Incomplete  CULTURE, BLOOD (ROUTINE X 2) w Reflex to ID Panel     Status: None (Preliminary result)   Collection Time: 05/11/16  2:24 PM  Result Value Ref Range Status   Specimen Description BLOOD LEFT HAND  Final   Special Requests   Final    BOTTLES DRAWN AEROBIC AND ANAEROBIC AER ANA   Culture  Setup Time   Final    Organism ID to follow GRAM POSITIVE COCCI AEROBIC BOTTLE ONLY CRITICAL RESULT CALLED TO, READ BACK BY AND VERIFIED WITH: JASON ROBBINS AT 1443 ON 05/12/16 BY SNJ    Culture GRAM POSITIVE COCCI  Final   Report Status PENDING  Incomplete  CULTURE, BLOOD (ROUTINE X 2) w Reflex to ID Panel     Status: None (Preliminary result)   Collection Time: 05/11/16  2:24 PM  Result Value Ref Range Status   Specimen Description BLOOD RIGHT AC  Final   Special Requests   Final    BOTTLES DRAWN AEROBIC AND ANAEROBIC AER ANA   Culture NO GROWTH < 24 HOURS  Final   Report Status PENDING  Incomplete  Blood Culture ID Panel (Reflexed)     Status: Abnormal   Collection Time: 05/11/16  2:24 PM  Result Value Ref Range Status   Enterococcus species NOT DETECTED NOT DETECTED Final   Listeria monocytogenes NOT DETECTED NOT DETECTED Final   Staphylococcus species DETECTED (A) NOT DETECTED Final    Comment: CRITICAL RESULT CALLED TO, READ BACK BY AND VERIFIED WITH: JASON ROBBINS AT  1443 ON 05/12/16 BY SNJ    Staphylococcus aureus NOT DETECTED NOT DETECTED Final   Methicillin resistance NOT DETECTED NOT DETECTED Final   Streptococcus species NOT DETECTED NOT DETECTED Final   Streptococcus agalactiae NOT DETECTED NOT DETECTED Final   Streptococcus pneumoniae NOT DETECTED NOT DETECTED Final   Streptococcus pyogenes NOT DETECTED NOT DETECTED Final   Acinetobacter baumannii NOT DETECTED NOT DETECTED Final   Enterobacteriaceae species NOT DETECTED NOT DETECTED  Final   Enterobacter cloacae complex NOT DETECTED NOT DETECTED Final   Escherichia coli NOT DETECTED NOT DETECTED Final   Klebsiella oxytoca NOT DETECTED NOT DETECTED Final   Klebsiella pneumoniae NOT DETECTED NOT DETECTED Final   Proteus species NOT DETECTED NOT DETECTED Final   Serratia marcescens NOT DETECTED NOT DETECTED Final   Haemophilus influenzae NOT DETECTED NOT DETECTED Final   Neisseria meningitidis NOT DETECTED NOT DETECTED Final   Pseudomonas aeruginosa NOT DETECTED NOT DETECTED Final   Candida albicans NOT DETECTED NOT DETECTED Final   Candida glabrata NOT DETECTED NOT DETECTED Final   Candida krusei NOT DETECTED NOT DETECTED Final   Candida parapsilosis NOT DETECTED NOT DETECTED Final   Candida tropicalis NOT DETECTED NOT DETECTED Final    RADIOLOGY:  Dg Chest Port 1 View  Result Date: 05/11/2016 CLINICAL DATA:  Cough and chills EXAM: PORTABLE CHEST 1 VIEW COMPARISON:  None. FINDINGS: Cardiac silhouette is enlarged with atherosclerotic calcification in the aortic arch. There are diffuse mild interstitial opacities. There is no pneumothorax or sizable pleural effusion. No lobar consolidation. IMPRESSION: Cardiomegaly, aortic atherosclerosis without overt pulmonary edema. Electronically Signed   By: Deatra Robinson M.D.   On: 05/11/2016 05:59       Management plans discussed with the patient, And he is  in agreement.  CODE STATUS:  Code Status History    Date Active Date Inactive Code Status  Order ID Comments User Context   05/11/2016 10:06 AM 05/12/2016  2:50 PM Full Code [id number]  Alford Highland, MD ED      TOTAL TIME TAKING CARE OF THIS PATIENT: 35  minutes.    Alford Highland M.D on 05/12/2016 at 5:36 PM  Between 7am to 6pm - Pager - [phone]  After 6pm go to [email or web address] - password [email or web address]  [facility] Office  [phone]  CC: Primary care physician; Dorothey Baseman, MD

## 2016-05-12 NOTE — Care Management (Signed)
Patient was weaned to RA.  Transitioned to oral antibiotics.  No RNCM needs identified.

## 2016-05-14 LAB — BODY FLUID CULTURE

## 2016-05-15 LAB — CULTURE, BLOOD (ROUTINE X 2)

## 2016-05-24 ENCOUNTER — Other Ambulatory Visit (INDEPENDENT_AMBULATORY_CARE_PROVIDER_SITE_OTHER): Payer: Self-pay | Admitting: Vascular Surgery

## 2016-05-24 ENCOUNTER — Encounter (INDEPENDENT_AMBULATORY_CARE_PROVIDER_SITE_OTHER): Payer: Self-pay

## 2016-05-24 MED ORDER — CEFAZOLIN IN D5W 1 GM/50ML IV SOLN
1.0000 g | INTRAVENOUS | Status: AC
  Administered 2016-05-25: 1 g via INTRAVENOUS

## 2016-05-25 ENCOUNTER — Encounter
Admission: RE | Disposition: A | Discharge status: Home / Self Care | Payer: Self-pay | Source: Ambulatory Visit | Attending: Vascular Surgery

## 2016-05-25 ENCOUNTER — Ambulatory Visit
Admission: RE | Admit: 2016-05-25 | Discharge: 2016-05-25 | Disposition: A | Discharge status: Home / Self Care | Payer: Medicare Other | Source: Ambulatory Visit | Attending: Vascular Surgery | Admitting: Vascular Surgery

## 2016-05-25 DIAGNOSIS — K659 Peritonitis, unspecified: Secondary | ICD-10-CM | POA: Insufficient documentation

## 2016-05-25 DIAGNOSIS — Z8249 Family history of ischemic heart disease and other diseases of the circulatory system: Secondary | ICD-10-CM | POA: Diagnosis not present

## 2016-05-25 DIAGNOSIS — Z888 Allergy status to other drugs, medicaments and biological substances status: Secondary | ICD-10-CM | POA: Insufficient documentation

## 2016-05-25 DIAGNOSIS — N186 End stage renal disease: Secondary | ICD-10-CM | POA: Diagnosis not present

## 2016-05-25 DIAGNOSIS — Z992 Dependence on renal dialysis: Secondary | ICD-10-CM | POA: Diagnosis not present

## 2016-05-25 DIAGNOSIS — I120 Hypertensive chronic kidney disease with stage 5 chronic kidney disease or end stage renal disease: Secondary | ICD-10-CM | POA: Diagnosis not present

## 2016-05-25 DIAGNOSIS — E1122 Type 2 diabetes mellitus with diabetic chronic kidney disease: Secondary | ICD-10-CM | POA: Diagnosis not present

## 2016-05-25 DIAGNOSIS — J449 Chronic obstructive pulmonary disease, unspecified: Secondary | ICD-10-CM | POA: Diagnosis not present

## 2016-05-25 DIAGNOSIS — Z886 Allergy status to analgesic agent status: Secondary | ICD-10-CM | POA: Insufficient documentation

## 2016-05-25 DIAGNOSIS — E785 Hyperlipidemia, unspecified: Secondary | ICD-10-CM | POA: Insufficient documentation

## 2016-05-25 DIAGNOSIS — F17210 Nicotine dependence, cigarettes, uncomplicated: Secondary | ICD-10-CM | POA: Insufficient documentation

## 2016-05-25 HISTORY — PX: PERIPHERAL VASCULAR CATHETERIZATION: SHX172C

## 2016-05-25 SURGERY — DIALYSIS/PERMA CATHETER INSERTION
Anesthesia: Moderate Sedation

## 2016-05-25 MED ORDER — HEPARIN SODIUM (PORCINE) 10000 UNIT/ML IJ SOLN
INTRAMUSCULAR | Status: AC
  Filled 2016-05-25: qty 1

## 2016-05-25 MED ORDER — FENTANYL CITRATE (PF) 100 MCG/2ML IJ SOLN
INTRAMUSCULAR | Status: DC | PRN
  Administered 2016-05-25 (×2): 50 ug via INTRAVENOUS

## 2016-05-25 MED ORDER — MIDAZOLAM HCL 2 MG/2ML IJ SOLN
INTRAMUSCULAR | Status: DC | PRN
  Administered 2016-05-25 (×2): 2 mg via INTRAVENOUS

## 2016-05-25 MED ORDER — HYDROMORPHONE HCL 1 MG/ML IJ SOLN: 1.0000 mg | Freq: Once | INTRAMUSCULAR | Status: DC

## 2016-05-25 MED ORDER — FENTANYL CITRATE (PF) 100 MCG/2ML IJ SOLN
INTRAMUSCULAR | Status: AC
  Filled 2016-05-25: qty 2

## 2016-05-25 MED ORDER — SODIUM CHLORIDE 0.9 % IV SOLN
INTRAVENOUS | Status: DC
  Administered 2016-05-25: 1000 mL via INTRAVENOUS

## 2016-05-25 MED ORDER — MIDAZOLAM HCL 5 MG/5ML IJ SOLN
INTRAMUSCULAR | Status: AC
  Filled 2016-05-25: qty 5

## 2016-05-25 MED ORDER — LIDOCAINE HCL (PF) 1 % IJ SOLN
INTRAMUSCULAR | Status: AC
  Filled 2016-05-25: qty 30

## 2016-05-25 MED ORDER — FAMOTIDINE 20 MG PO TABS: 40.0000 mg | ORAL_TABLET | ORAL | Status: DC | PRN

## 2016-05-25 MED ORDER — HEPARIN SODIUM (PORCINE) 1000 UNIT/ML IJ SOLN
INTRAMUSCULAR | Status: AC
  Filled 2016-05-25: qty 1

## 2016-05-25 MED ORDER — HEPARIN (PORCINE) IN NACL 2-0.9 UNIT/ML-% IJ SOLN
INTRAMUSCULAR | Status: AC
  Filled 2016-05-25: qty 500

## 2016-05-25 MED ORDER — ONDANSETRON HCL 4 MG/2ML IJ SOLN: 4.0000 mg | Freq: Four times a day (QID) | INTRAMUSCULAR | Status: DC | PRN

## 2016-05-25 MED ORDER — METHYLPREDNISOLONE SODIUM SUCC 125 MG IJ SOLR: 125.0000 mg | INTRAMUSCULAR | Status: DC | PRN

## 2016-05-25 SURGICAL SUPPLY — 3 items
CATH PALINDROME RT-P 15FX19CM (CATHETERS) ×2 IMPLANT
PACK ANGIOGRAPHY (CUSTOM PROCEDURE TRAY) ×2 IMPLANT
TOWEL OR 17X26 4PK STRL BLUE (TOWEL DISPOSABLE) ×2 IMPLANT

## 2016-05-25 NOTE — H&P (Signed)
[facility] History & Physical Update  The patient was interviewed and re-examined.  The patient's previous History and Physical has been reviewed and is unchanged.  There is no change in the plan of care. We plan to proceed with the scheduled procedure.  Festus Barren, MD  05/25/2016, 11:06 AM

## 2016-05-25 NOTE — Op Note (Signed)
OPERATIVE NOTE    PRE-OPERATIVE DIAGNOSIS: 1. ESRD 2. Peritonitis on peritoneal dialysis  POST-OPERATIVE DIAGNOSIS: same as above  PROCEDURE: 1. Ultrasound guidance for vascular access to the right internal jugular vein 2. Fluoroscopic guidance for placement of catheter 3. Placement of a 19 cm tip to cuff tunneled hemodialysis catheter via the right internal jugular vein  SURGEON: Festus Barren, MD  ANESTHESIA:  Local with Moderate conscious sedation for approximately 20 minutes using 4 mg of Versed and 100 mcg of Fentanyl  ESTIMATED BLOOD LOSS: 25 cc  FLUORO TIME: less than one minute  CONTRAST: none  FINDING(S): 1.  Patent right internal jugular vein  SPECIMEN(S):  None  INDICATIONS:   Justin Fitzgerald is a 56 y.o.male who presents with failure of PD and ESRD.  The patient needs long term dialysis access for their ESRD, and a Permcath is necessary.  Risks and benefits are discussed and informed consent is obtained.    DESCRIPTION: After obtaining full informed written consent, the patient was brought back to the vascular suited. The patient's right neck and chest were sterilely prepped and draped in a sterile surgical field was created. Moderate conscious sedation was administered during a face to face encounter with the patient throughout the procedure with my supervision of the RN administering medicines and monitoring the patient's vital signs, pulse oximetry, telemetry and mental status throughout from the start of the procedure until the patient was taken to the recovery room.  The right internal jugular vein was visualized with ultrasound and found to be patent. It was then accessed under direct ultrasound guidance and a permanent image was recorded. A wire was placed. After skin nick and dilatation, the peel-away sheath was placed over the wire. I then turned my attention to an area under the clavicle. Approximately 1-2 fingerbreadths below the clavicle a small  counterincision was created and tunneled from the subclavicular incision to the access site. Using fluoroscopic guidance, a 19 centimeter tip to cuff tunneled hemodialysis catheter was selected, and tunneled from the subclavicular incision to the access site. It was then placed through the peel-away sheath and the peel-away sheath was removed. Using fluoroscopic guidance the catheter tips were parked in the right atrium. The appropriate distal connectors were placed. It withdrew blood well and flushed easily with heparinized saline and a concentrated heparin solution was then placed. It was secured to the chest wall with 2 Prolene sutures. The access incision was closed single 4-0 Monocryl. A 4-0 Monocryl pursestring suture was placed around the exit site. Sterile dressings were placed. The patient tolerated the procedure well and was taken to the recovery room in stable condition.  COMPLICATIONS: None  CONDITION: Stable  Festus Barren  02/21/2016, 12:56 PM   This note was created with Dragon Medical transcription system. Any errors in dictation are purely unintentional.

## 2016-05-26 ENCOUNTER — Encounter: Payer: Self-pay | Admitting: Vascular Surgery

## 2016-05-27 DIAGNOSIS — N186 End stage renal disease: Secondary | ICD-10-CM

## 2016-05-31 ENCOUNTER — Encounter (INDEPENDENT_AMBULATORY_CARE_PROVIDER_SITE_OTHER): Payer: Medicare Other

## 2016-05-31 ENCOUNTER — Ambulatory Visit (INDEPENDENT_AMBULATORY_CARE_PROVIDER_SITE_OTHER): Payer: Medicare Other | Admitting: Vascular Surgery

## 2016-06-20 DIAGNOSIS — I639 Cerebral infarction, unspecified: Secondary | ICD-10-CM

## 2016-06-20 HISTORY — DX: Cerebral infarction, unspecified: I63.9

## 2016-06-21 ENCOUNTER — Emergency Department (HOSPITAL_COMMUNITY): Payer: Medicare Other

## 2016-06-21 ENCOUNTER — Encounter (HOSPITAL_COMMUNITY): Payer: Self-pay | Admitting: Family Medicine

## 2016-06-21 ENCOUNTER — Inpatient Hospital Stay (HOSPITAL_COMMUNITY)
Admission: EM | Admit: 2016-06-21 | Discharge: 2016-06-26 | DRG: 064 | Disposition: A | Discharge status: Home / Self Care | Payer: Medicare Other | Attending: Internal Medicine | Admitting: Internal Medicine

## 2016-06-21 ENCOUNTER — Inpatient Hospital Stay (HOSPITAL_COMMUNITY): Payer: Medicare Other

## 2016-06-21 DIAGNOSIS — I5040 Unspecified combined systolic (congestive) and diastolic (congestive) heart failure: Secondary | ICD-10-CM | POA: Diagnosis not present

## 2016-06-21 DIAGNOSIS — I472 Ventricular tachycardia: Secondary | ICD-10-CM | POA: Diagnosis not present

## 2016-06-21 DIAGNOSIS — N269 Renal sclerosis, unspecified: Secondary | ICD-10-CM | POA: Diagnosis present

## 2016-06-21 DIAGNOSIS — R29810 Facial weakness: Secondary | ICD-10-CM | POA: Diagnosis present

## 2016-06-21 DIAGNOSIS — R29705 NIHSS score 5: Secondary | ICD-10-CM | POA: Diagnosis present

## 2016-06-21 DIAGNOSIS — I5042 Chronic combined systolic (congestive) and diastolic (congestive) heart failure: Secondary | ICD-10-CM | POA: Diagnosis not present

## 2016-06-21 DIAGNOSIS — F17210 Nicotine dependence, cigarettes, uncomplicated: Secondary | ICD-10-CM | POA: Diagnosis present

## 2016-06-21 DIAGNOSIS — I2510 Atherosclerotic heart disease of native coronary artery without angina pectoris: Secondary | ICD-10-CM | POA: Diagnosis present

## 2016-06-21 DIAGNOSIS — I959 Hypotension, unspecified: Secondary | ICD-10-CM | POA: Diagnosis not present

## 2016-06-21 DIAGNOSIS — E1122 Type 2 diabetes mellitus with diabetic chronic kidney disease: Secondary | ICD-10-CM | POA: Diagnosis not present

## 2016-06-21 DIAGNOSIS — E1151 Type 2 diabetes mellitus with diabetic peripheral angiopathy without gangrene: Secondary | ICD-10-CM | POA: Diagnosis not present

## 2016-06-21 DIAGNOSIS — I132 Hypertensive heart and chronic kidney disease with heart failure and with stage 5 chronic kidney disease, or end stage renal disease: Secondary | ICD-10-CM | POA: Diagnosis not present

## 2016-06-21 DIAGNOSIS — I63411 Cerebral infarction due to embolism of right middle cerebral artery: Secondary | ICD-10-CM | POA: Diagnosis not present

## 2016-06-21 DIAGNOSIS — Z992 Dependence on renal dialysis: Secondary | ICD-10-CM

## 2016-06-21 DIAGNOSIS — R4781 Slurred speech: Secondary | ICD-10-CM | POA: Diagnosis present

## 2016-06-21 DIAGNOSIS — E785 Hyperlipidemia, unspecified: Secondary | ICD-10-CM | POA: Diagnosis not present

## 2016-06-21 DIAGNOSIS — J449 Chronic obstructive pulmonary disease, unspecified: Secondary | ICD-10-CM | POA: Diagnosis present

## 2016-06-21 DIAGNOSIS — Z8249 Family history of ischemic heart disease and other diseases of the circulatory system: Secondary | ICD-10-CM

## 2016-06-21 DIAGNOSIS — D631 Anemia in chronic kidney disease: Secondary | ICD-10-CM | POA: Diagnosis present

## 2016-06-21 DIAGNOSIS — R609 Edema, unspecified: Secondary | ICD-10-CM | POA: Diagnosis not present

## 2016-06-21 DIAGNOSIS — I428 Other cardiomyopathies: Secondary | ICD-10-CM | POA: Diagnosis present

## 2016-06-21 DIAGNOSIS — Z7902 Long term (current) use of antithrombotics/antiplatelets: Secondary | ICD-10-CM | POA: Diagnosis not present

## 2016-06-21 DIAGNOSIS — N186 End stage renal disease: Secondary | ICD-10-CM | POA: Diagnosis not present

## 2016-06-21 DIAGNOSIS — I10 Essential (primary) hypertension: Secondary | ICD-10-CM | POA: Diagnosis not present

## 2016-06-21 DIAGNOSIS — J984 Other disorders of lung: Secondary | ICD-10-CM | POA: Diagnosis not present

## 2016-06-21 DIAGNOSIS — R531 Weakness: Secondary | ICD-10-CM | POA: Diagnosis present

## 2016-06-21 DIAGNOSIS — I6300 Cerebral infarction due to thrombosis of unspecified precerebral artery: Secondary | ICD-10-CM | POA: Diagnosis not present

## 2016-06-21 DIAGNOSIS — I5022 Chronic systolic (congestive) heart failure: Secondary | ICD-10-CM | POA: Diagnosis not present

## 2016-06-21 DIAGNOSIS — K429 Umbilical hernia without obstruction or gangrene: Secondary | ICD-10-CM | POA: Diagnosis present

## 2016-06-21 DIAGNOSIS — Z6841 Body Mass Index (BMI) 40.0 and over, adult: Secondary | ICD-10-CM

## 2016-06-21 DIAGNOSIS — Z886 Allergy status to analgesic agent status: Secondary | ICD-10-CM

## 2016-06-21 DIAGNOSIS — I120 Hypertensive chronic kidney disease with stage 5 chronic kidney disease or end stage renal disease: Secondary | ICD-10-CM | POA: Diagnosis not present

## 2016-06-21 DIAGNOSIS — J320 Chronic maxillary sinusitis: Secondary | ICD-10-CM | POA: Diagnosis present

## 2016-06-21 DIAGNOSIS — Z7951 Long term (current) use of inhaled steroids: Secondary | ICD-10-CM | POA: Diagnosis not present

## 2016-06-21 DIAGNOSIS — I639 Cerebral infarction, unspecified: Secondary | ICD-10-CM | POA: Diagnosis not present

## 2016-06-21 DIAGNOSIS — I2584 Coronary atherosclerosis due to calcified coronary lesion: Secondary | ICD-10-CM | POA: Diagnosis present

## 2016-06-21 DIAGNOSIS — N185 Chronic kidney disease, stage 5: Secondary | ICD-10-CM | POA: Diagnosis not present

## 2016-06-21 DIAGNOSIS — I5043 Acute on chronic combined systolic (congestive) and diastolic (congestive) heart failure: Secondary | ICD-10-CM | POA: Diagnosis not present

## 2016-06-21 DIAGNOSIS — I440 Atrioventricular block, first degree: Secondary | ICD-10-CM | POA: Diagnosis present

## 2016-06-21 DIAGNOSIS — I739 Peripheral vascular disease, unspecified: Secondary | ICD-10-CM

## 2016-06-21 DIAGNOSIS — E119 Type 2 diabetes mellitus without complications: Secondary | ICD-10-CM

## 2016-06-21 DIAGNOSIS — I429 Cardiomyopathy, unspecified: Secondary | ICD-10-CM | POA: Diagnosis not present

## 2016-06-21 DIAGNOSIS — Z888 Allergy status to other drugs, medicaments and biological substances status: Secondary | ICD-10-CM

## 2016-06-21 HISTORY — DX: Type 2 diabetes mellitus without complications: E11.9

## 2016-06-21 HISTORY — DX: Personal history of other diseases of the musculoskeletal system and connective tissue: Z87.39

## 2016-06-21 HISTORY — DX: Dependence on renal dialysis: Z99.2

## 2016-06-21 HISTORY — DX: Dependence on renal dialysis: N18.6

## 2016-06-21 HISTORY — DX: Cerebral infarction, unspecified: I63.9

## 2016-06-21 LAB — COMPREHENSIVE METABOLIC PANEL
ALBUMIN: 2.9 g/dL — AB (ref 3.5–5.0)
ALT: 14 U/L — ABNORMAL LOW (ref 17–63)
AST: 19 U/L (ref 15–41)
Alkaline Phosphatase: 101 U/L (ref 38–126)
Anion gap: 13 (ref 5–15)
BUN: 24 mg/dL — AB (ref 6–20)
CHLORIDE: 99 mmol/L — AB (ref 101–111)
CO2: 25 mmol/L (ref 22–32)
Calcium: 8.9 mg/dL (ref 8.9–10.3)
Creatinine, Ser: 6.68 mg/dL — ABNORMAL HIGH (ref 0.61–1.24)
GFR calc Af Amer: 10 mL/min — ABNORMAL LOW (ref >60)
GFR, EST NON AFRICAN AMERICAN: 8 mL/min — AB (ref >60)
Glucose, Bld: 98 mg/dL (ref 65–99)
POTASSIUM: 3.6 mmol/L (ref 3.5–5.1)
Sodium: 137 mmol/L (ref 135–145)
Total Bilirubin: 0.4 mg/dL (ref 0.3–1.2)
Total Protein: 6.7 g/dL (ref 6.5–8.1)

## 2016-06-21 LAB — DIFFERENTIAL
BASOS ABS: 0 10*3/uL (ref 0.0–0.1)
BASOS PCT: 1 %
EOS ABS: 0.4 10*3/uL (ref 0.0–0.7)
Eosinophils Relative: 6 %
Lymphocytes Relative: 18 %
Lymphs Abs: 1.3 10*3/uL (ref 0.7–4.0)
MONOS PCT: 9 %
Monocytes Absolute: 0.7 10*3/uL (ref 0.1–1.0)
Neutro Abs: 4.8 10*3/uL (ref 1.7–7.7)
Neutrophils Relative %: 66 %

## 2016-06-21 LAB — CBG MONITORING, ED
Glucose-Capillary: 95 mg/dL (ref 65–99)
Glucose-Capillary: 103 mg/dL — ABNORMAL HIGH (ref 65–99)

## 2016-06-21 LAB — CBC
HCT: 30.8 % — ABNORMAL LOW (ref 39.0–52.0)
Hemoglobin: 9.4 g/dL — ABNORMAL LOW (ref 13.0–17.0)
MCH: 28.1 pg (ref 26.0–34.0)
MCHC: 30.5 g/dL (ref 30.0–36.0)
MCV: 91.9 fL (ref 78.0–100.0)
Platelets: 246 10*3/uL (ref 150–400)
RBC: 3.35 MIL/uL — ABNORMAL LOW (ref 4.22–5.81)
RDW: 17.5 % — AB (ref 11.5–15.5)
WBC: 7.3 10*3/uL (ref 4.0–10.5)

## 2016-06-21 LAB — ETHANOL: Alcohol, Ethyl (B): <5 mg/dL (ref <5)

## 2016-06-21 LAB — I-STAT CHEM 8, ED
BUN: 25 mg/dL — ABNORMAL HIGH (ref 6–20)
Calcium, Ion: 1.08 mmol/L — ABNORMAL LOW (ref 1.15–1.40)
Chloride: 98 mmol/L — ABNORMAL LOW (ref 101–111)
Creatinine, Ser: 7 mg/dL — ABNORMAL HIGH (ref 0.61–1.24)
Glucose, Bld: 99 mg/dL (ref 65–99)
HEMATOCRIT: 29 % — AB (ref 39.0–52.0)
HEMOGLOBIN: 9.9 g/dL — AB (ref 13.0–17.0)
POTASSIUM: 3.4 mmol/L — AB (ref 3.5–5.1)
Sodium: 137 mmol/L (ref 135–145)
TCO2: 28 mmol/L (ref 0–100)

## 2016-06-21 LAB — GLUCOSE, CAPILLARY: Glucose-Capillary: 87 mg/dL (ref 65–99)

## 2016-06-21 LAB — I-STAT TROPONIN, ED: TROPONIN I, POC: 0.07 ng/mL (ref 0.00–0.08)

## 2016-06-21 LAB — PROTIME-INR
INR: 0.97
Prothrombin Time: 12.9 seconds (ref 11.4–15.2)

## 2016-06-21 LAB — APTT: APTT: 32 s (ref 24–36)

## 2016-06-21 MED ORDER — CLOPIDOGREL BISULFATE 75 MG PO TABS
75.0000 mg | ORAL_TABLET | Freq: Every day | ORAL | Status: DC
  Administered 2016-06-21 – 2016-06-22 (×2): 75 mg via ORAL
  Filled 2016-06-21 (×2): qty 1

## 2016-06-21 MED ORDER — CINACALCET HCL 30 MG PO TABS
120.0000 mg | ORAL_TABLET | Freq: Every day | ORAL | Status: DC
  Administered 2016-06-21 – 2016-06-25 (×5): 120 mg via ORAL
  Filled 2016-06-21 (×6): qty 4

## 2016-06-21 MED ORDER — INSULIN ASPART 100 UNIT/ML ~~LOC~~ SOLN: 0.0000 [IU] | Freq: Every day | SUBCUTANEOUS | Status: DC

## 2016-06-21 MED ORDER — CINACALCET HCL 30 MG PO TABS: 90.0000 mg | ORAL_TABLET | Freq: Every day | ORAL | Status: DC

## 2016-06-21 MED ORDER — ALBUTEROL SULFATE (2.5 MG/3ML) 0.083% IN NEBU
2.5000 mg | INHALATION_SOLUTION | Freq: Four times a day (QID) | RESPIRATORY_TRACT | Status: DC | PRN
  Filled 2016-06-21: qty 3

## 2016-06-21 MED ORDER — BUDESONIDE 0.25 MG/2ML IN SUSP
0.2500 mg | Freq: Two times a day (BID) | RESPIRATORY_TRACT | Status: DC
  Administered 2016-06-22 – 2016-06-26 (×8): 0.25 mg via RESPIRATORY_TRACT
  Filled 2016-06-21 (×11): qty 2

## 2016-06-21 MED ORDER — ENOXAPARIN SODIUM 40 MG/0.4ML ~~LOC~~ SOLN
40.0000 mg | SUBCUTANEOUS | Status: DC
  Administered 2016-06-21: 40 mg via SUBCUTANEOUS
  Filled 2016-06-21: qty 0.4

## 2016-06-21 MED ORDER — INSULIN ASPART 100 UNIT/ML ~~LOC~~ SOLN: 0.0000 [IU] | Freq: Three times a day (TID) | SUBCUTANEOUS | Status: DC

## 2016-06-21 MED ORDER — STROKE: EARLY STAGES OF RECOVERY BOOK
Freq: Once | Status: AC
  Administered 2016-06-21: 17:00:00
  Filled 2016-06-21: qty 1

## 2016-06-21 MED ORDER — TIOTROPIUM BROMIDE MONOHYDRATE 18 MCG IN CAPS
18.0000 ug | ORAL_CAPSULE | Freq: Every day | RESPIRATORY_TRACT | Status: DC
  Administered 2016-06-23 – 2016-06-26 (×2): 18 ug via RESPIRATORY_TRACT
  Filled 2016-06-21 (×2): qty 5

## 2016-06-21 MED ORDER — BECLOMETHASONE DIPROPIONATE 40 MCG/ACT IN AERS: 1.0000 | INHALATION_SPRAY | Freq: Two times a day (BID) | RESPIRATORY_TRACT | Status: DC

## 2016-06-21 MED ORDER — FLUTICASONE PROPIONATE HFA 44 MCG/ACT IN AERO: 2.0000 | INHALATION_SPRAY | Freq: Two times a day (BID) | RESPIRATORY_TRACT | Status: DC

## 2016-06-21 MED ORDER — SEVELAMER CARBONATE 2.4 G PO PACK
2.4000 g | PACK | Freq: Three times a day (TID) | ORAL | Status: DC
  Administered 2016-06-25 – 2016-06-26 (×4): 2.4 g via ORAL
  Filled 2016-06-21 (×13): qty 1

## 2016-06-21 MED ORDER — GABAPENTIN 300 MG PO CAPS
300.0000 mg | ORAL_CAPSULE | Freq: Every day | ORAL | Status: DC
  Administered 2016-06-21 – 2016-06-25 (×5): 300 mg via ORAL
  Filled 2016-06-21 (×5): qty 1

## 2016-06-21 NOTE — ED Notes (Signed)
ED Provider at bedside. 

## 2016-06-21 NOTE — Progress Notes (Signed)
Patient admitted to [phone]. Oriented x4, no skin issues noted. Set up on tele.

## 2016-06-21 NOTE — ED Notes (Signed)
Pt's wife not in waiting area, pt updated. PT aware of need for urine sample and urinal at bedside. MRI aware of stat MRI order and patient is updated as well.

## 2016-06-21 NOTE — ED Triage Notes (Signed)
Pt presents from home via [facility] with c/o Code stroke - L facial droop, left sided weakness, and mild dysarthria per EMS. LSN was last night prior to going to sleep.  PT is dialysis patient, no IV access is obtained. Labs unable to be obtained at the bridge prior to CT.

## 2016-06-21 NOTE — H&P (Signed)
Date: 06/21/2016               Patient Name:  Amanpreet Oathout MRN: [id number]  DOB: 1961-02-07 Age / Sex: 56 y.o., male   PCP: Dorothey Baseman, MD         Medical Service: Internal Medicine Teaching Service         Attending Physician: Dr. Doneen Poisson, MD    First Contact: Dr. Thomasene Lot Pager: [phone]  Second Contact: Dr. Darreld Mclean Pager: [phone]       After Hours (After 5p/  First Contact Pager: [phone]  weekends / holidays): Second Contact Pager: [phone]   Chief Complaint: Left facial droop  History of Present Illness: Mr. Terp is a 56 year old African-American male with a past medical history of end-stage renal disease on dialysis TTS via Right IJ, hypertension, peripheral vascular disease and diabetes who presents with left-sided facial droop and slurred speech. Per the patient he woke up this morning and did not notice anything but his wife came to check on him and noticed that he was having a left facial droop and slurring of speech. Subjectively, he only thought he had mild left hand weakness. He did not know he had facial droop or slurring of speech. After his wife noticed this EMS was called and the patient was brought to the [facility] emergency department and a code stroke was activated. During this event the patient did not have a headache or vision changes. He denied chest pain or shortness of breath. He denied nausea, vomiting or abdominal pain. He denied diarrhea or constipation. He denied fevers or chills. He had no additional acute complaints or concerns on review of systems.  In the emergency department a code stroke was called and a stat head CT without contrast was obtained and showed a subtle loss of gray-white differentiation in the posterior right insula and lentiform nucleus suggesting of acute ischemia. Given that the patient's last known normal was when he went to bed last night TPA was not given as he was out of the therapeutic window. While  in the emergency department the patient was afebrile and hemodynamically stable. Labs were significant for an anemia with a hemoglobin of 9.4, BUN of 24 and a creatinine of 6.68. I-STAT troponin negative. Neurology was consulted and the patient was admitted to the [facility] internal medicine teaching service for stroke workup  Meds:  Current Meds  Medication Sig  . cinacalcet (SENSIPAR) 30 MG tablet Take 4 tablets (120 mg total) by mouth daily with supper.  . clopidogrel (PLAVIX) 75 MG tablet Take 75 mg by mouth daily.  . furosemide (LASIX) 80 MG tablet Take 160 mg by mouth 2 (two) times daily.  [name] gabapentin (NEURONTIN) 300 MG capsule Take 300 mg by mouth at bedtime.   . sevelamer carbonate (RENVELA) 2.4 g PACK Take 2.4 g by mouth 3 (three) times daily with meals.  [name] SPIRIVA HANDIHALER 18 MCG inhalation capsule Place 18 mcg into inhaler and inhale daily.      Allergies: Allergies as of 06/21/2016 - Review Complete 06/21/2016  Allergen Reaction Noted  . Ramipril Shortness Of Breath 09/07/2014  . Aspirin  09/07/2014   Past Medical History:  Diagnosis Date  . Chronic kidney disease   . Diabetes mellitus without complication (HCC)   . Hyperlipidemia   . Hypertension   . Peripheral vascular disease (HCC)     Family History:  Family History  Problem Relation Age of Onset  .  Hypertension Mother   . CAD Mother      Social History:  Social History   Social History  . Marital status: Single    Spouse name: N/A  . Number of children: N/A  . Years of education: N/A   Occupational History  . Not on file.   Social History Main Topics  . Smoking status: Current Every Day Smoker    Packs/day: 1.00    Types: Cigarettes  . Smokeless tobacco: Current User  . Alcohol use No  . Drug use: No  . Sexual activity: Not on file   Other Topics Concern  . Not on file   Social History Narrative  . No narrative on file     Review of Systems: A complete ROS was negative except as  per HPI.   Physical Exam: Blood pressure 98/73, pulse 88, temperature 98.1 F (36.7 C), temperature source Oral, resp. rate 18, weight (!) 320 lb 5.3 oz (145.3 kg), SpO2 100 %. Physical Exam  Constitutional: He is oriented to person, place, and time. He appears well-developed and well-nourished.  HENT:  Head: Normocephalic and atraumatic.  Cardiovascular: Normal rate.   Heart sounds very distant and difficult to appreciate  Respiratory: Effort normal. He has wheezes.  Lungs sounds distant and difficult to auscultate however minimal expiratory wheeze noted  GI: Soft. Bowel sounds are normal. He exhibits no distension. There is no tenderness.  Musculoskeletal: He exhibits edema.  Bilateral lower extremity edema. Extremities with a woody feeling.  Neurological: He is alert and oriented to person, place, and time.  Left-sided facial droop. Remainder of cranial nerves intact. Strength 5 out of 5 in the upper extremities and lower extremities bilaterally. Sensation intact bilaterally. Finger-nose-finger normal. Minimal difficulty with rapid alternating movements of the left hand.     EKG: J point elevation in lead 2. No acute ST segment elevation.  CXR: Not obtained  Assessment & Plan by Problem: Active Problems:   CVA (cerebral vascular accident) [facility])  Mr. Crutchfield is a 56 year old African-American gentleman with a medical history of end-stage renal disease, hypertension and diabetes who presents with left facial droop upon awakening this morning and a CT scan concerning for acute infarct.  # Stroke The patient presents with acute onset of left facial droop and slurred speech. CT scan without contrast demonstrates subtle loss of gray-white differentiation in the posterior right insula suggesting acute ischemia. Patient has multiple stroke risk factors including hypertension and diabetes. Neurology is following. We will pursue stroke workup. Patient was on clopidogrel during the stroke  and thus has failed clopidogrel as a primary prevention strategy. Will appreciate neurology recommendations on antiplatelet strategies going forward. -- MRI brain without contrast -- MRA head/neck without contrast -- Echocardiogram -- Lipid panel -- A1c -- Nothing by mouth -- Permissive hypertension with a goal of 220/110 -- Clopidogrel 75 mg  # ESRD on HD TTS ## HTN Patient with end-stage renal disease on hemodialysis. Access via right IJ tunneled catheter. Patient also with a history of hypertension. In the setting of acute infarct we will hold antihypertensive medications and allow for permissive hypertension with a theoretical benefit of perfusing the penumbra. -- Continue current dialysis schedule -- Blood pressure goal of 220/110 -- Home meds that are held include Metolazone, furosemide, metoprolol   # DM -- Sliding scale insulin -- Hemoglobin A1c  # Anemia Patient has anemia of chronic kidney disease. Most recent hemoglobin of 9.4. -- EPO outpatient  ? COPD Patient with tiotropium listed as  a medication. Prior hospitalization for potential COPD exacerbation. No PFTs on file -- Spiriva -- Albuterol nebulizer q6 hrs prn  DVT/PE prophylaxis: Lovenox FEN/GI: Nothing by mouth pending speech evaluation Code: Full code    Dispo: Admit patient to Observation with expected length of stay less than 2 midnights.  Signed: Thomasene Lot, MD 06/21/2016, 3:01 PM  Pager: [phone]

## 2016-06-21 NOTE — H&P (Signed)
Date: 06/21/2016               Patient Name:  Justin Fitzgerald MRN: [id number]  DOB: December 10, 1960 Age / Sex: 56 y.o., male   PCP: Dorothey Baseman, MD         Medical Service: Internal Medicine Teaching Service         Attending Physician: Dr. Doneen Poisson, MD    First Contact: Dr. Ladona Ridgel Pager: [phone]  Second Contact: Dr. Allena Katz Pager: [phone]       After Hours (After 5p/  First Contact Pager: [phone]  weekends / holidays): Second Contact Pager: [phone]   Chief Complaint: Left facial droop and slurred speech  History of Present Illness: Justin Fitzgerald is a 56 y.o. male with a h/o ESRD on peritoneal dialysis, HTN, T2DM, HLD, possible COPD, and PVD who presents to ED with left facial droop and slurred speech. Patient states that he woke up at 7 AM and went to go get a cup of coffee and dropped it. When his wife woke up, his wife noticed that the patient had left facial droop and slurred speech. He was then brought to ED by EMS. Patient denies any vision changes or headaches. He denies fever, chills, nausea, vomiting or abdominal pain. Patient denies any chest pain or shortness of breath.   Meds:  Current Meds  Medication Sig  . cinacalcet (SENSIPAR) 30 MG tablet Take 4 tablets (120 mg total) by mouth daily with supper.  . clopidogrel (PLAVIX) 75 MG tablet Take 75 mg by mouth daily.  . furosemide (LASIX) 80 MG tablet Take 160 mg by mouth 2 (two) times daily.  [name] gabapentin (NEURONTIN) 300 MG capsule Take 300 mg by mouth at bedtime.   . sevelamer carbonate (RENVELA) 2.4 g PACK Take 2.4 g by mouth 3 (three) times daily with meals.  [name] SPIRIVA HANDIHALER 18 MCG inhalation capsule Place 18 mcg into inhaler and inhale daily.     Allergies: Allergies as of 06/21/2016 - Review Complete 06/21/2016  Allergen Reaction Noted  . Ramipril Shortness Of Breath 09/07/2014  . Aspirin  09/07/2014   Past Medical History:  Diagnosis Date  . Chronic kidney disease   . Diabetes  mellitus without complication (HCC)   . Hyperlipidemia   . Hypertension   . Peripheral vascular disease (HCC)     Family History:  Family History  Problem Relation Age of Onset  . Hypertension Mother   . CAD Mother     Social History:  Social History   Social History  . Marital status: Married    Spouse name: N/A  . Number of children: N/A  . Years of education: N/A   Occupational History  . Not on file.   Social History Main Topics  . Smoking status: Current Every Day Smoker    Packs/day: 1.00    Types: Cigarettes  . Smokeless tobacco: Current User  . Alcohol use No  . Drug use: No  . Sexual activity: Not on file   Other Topics Concern  . Not on file   Social History Narrative  . No narrative on file     Review of Systems: A complete ROS was negative except as per HPI.   Physical Exam: Blood pressure 98/73, pulse 88, temperature 98.1 F (36.7 C), temperature source Oral, resp. rate 18, weight (!) 145.3 kg (320 lb 5.3 oz), SpO2 100 %.   Physical Exam  Constitutional: He is oriented to person, place, and time and  well-developed, well-nourished, and in no distress.  HENT:  Head: Normocephalic and atraumatic.  Cardiovascular: Normal rate.   Distant heart sounds  Pulmonary/Chest: Effort normal. He has wheezes.  Abdominal: Soft. Bowel sounds are normal. He exhibits no distension. There is no tenderness.  Musculoskeletal:  Bilateral lower extremity non-pitting edema  Neurological: He is alert and oriented to person, place, and time.  Left-sided facial droop. Rest of cranial nerves intact. 5/5 strength in upper and lower extremities. Had some difficulty w/ left handed rapid alternating movements    UDS Pending  UA Pending  CBG (last 3)   Recent Labs  06/21/16 1135  GLUCAP 95   Alcohol Level    Component Value Date/Time   ETH <5 06/21/2016 1205    Ref Range & Units 12:05  Prothrombin Time 11.4 - 15.2 seconds 12.9   INR  0.97    aPTT 24 - 36  seconds 32    CBC    Component Value Date/Time   WBC 7.3 06/21/2016 1205   RBC 3.35 (L) 06/21/2016 1205   HGB 9.9 (L) 06/21/2016 1223   HCT 29.0 (L) 06/21/2016 1223   PLT 246 06/21/2016 1205   MCV 91.9 06/21/2016 1205   MCH 28.1 06/21/2016 1205   MCHC 30.5 06/21/2016 1205   RDW 17.5 (H) 06/21/2016 1205   LYMPHSABS 1.3 06/21/2016 1205   MONOABS 0.7 06/21/2016 1205   EOSABS 0.4 06/21/2016 1205   BASOSABS 0.0 06/21/2016 1205   CMP     Component Value Date/Time   NA 137 06/21/2016 1223   K 3.4 (L) 06/21/2016 1223   CL 98 (L) 06/21/2016 1223   CO2 25 06/21/2016 1205   GLUCOSE 99 06/21/2016 1223   BUN 25 (H) 06/21/2016 1223   CREATININE 7.00 (H) 06/21/2016 1223   CALCIUM 8.9 06/21/2016 1205   PROT 6.7 06/21/2016 1205   ALBUMIN 2.9 (L) 06/21/2016 1205   AST 19 06/21/2016 1205   ALT 14 (L) 06/21/2016 1205   ALKPHOS 101 06/21/2016 1205   BILITOT 0.4 06/21/2016 1205   GFRNONAA 8 (L) 06/21/2016 1205   GFRAA 10 (L) 06/21/2016 1205   Lipid Panel: Pending  EKG:  Prolonged PR interval No ST segment elevation  CT w/o contrast: IMPRESSION: 1. Subtle loss of gray-white differentiation in the posterior right insula and lentiform nucleus suggesting acute ischemia. 2. Chronic bilateral maxillary sinus disease 3. ASPECTS is 8/10   Electronically Signed   By: Marin Roberts M.D.   On: 06/21/2016 11:58  Assessment & Plan by Problem: Active Problems:   CVA (cerebral vascular accident) (HCC)  Justin Fitzgerald is a 55 y.o. male with a h/o ESRD on peritoneal dialysis, HTN, T2DM, HLD, possible COPD, and PVD who presents to ED with left facial droop and slurred speech. Symptoms concerning for stroke.   CVA Patient presented to ED with left facial dropp and slurred speech. CT was performed and demonstrated  Subtle loss of gray-white differentiation in the posterior right insula and lentiform nucleus suggesting acute ischemia. Patient has multiple risk factors for  stroke. Will order lipid panel and hemoglobin A1c. Will get multiple imaging to assess cause including MRI, MRA, and Echo. Neurology is following. Patient was already on Plavix (patient is allergic to ASA). Will discuss with Neurology new antiplatelet regimen. -- MRI brain without contrast pending -- MRA head/neck without contrast pending -- Echocardiogram pending -- Lipid panel pending -- A1c pending -- cont home Clopidogrel 75 mg  HTN Current guidelines state that in the setting  of acute ischemic stroke antihypertensive treatment is warranted in patients with systolic blood pressure greater than 220 mm Hg, receiving thrombolytic therapy, or with concomitant medical issues.  Will allow for hypertension to perfuse penumbra and allow for perfusion around ischemic area. Will hold BP medications for 24 hours. -- permissive hypertension with a goal of 220/110 -- blood pressure goal of 220/110 -- hold home Metolazone, Furosemide, Metoprolol   CKD Patient has ESRD 2/2 FSGS. Patient gets HD on Tuesday, Thursday, and Saturday. -- Continue current dialysis schedule  Anemia Anemia most likely due to CKD. -- EPO outpatient  COPD Unclear history of COPD. Patient was treated for COPD exacerbation on 05/19/2016 on outpatient.  -- Spiriva -- Albuterol nebulizer q6 hrs prn  DM -- Sliding scale insulin -- Hemoglobin A1c  DVT/PE prophylaxis:SQH FEN/GI: NPO Code: FULL  Dispo: Admit patient to Observation with expected length of stay less than 2 midnights.  Signed: Caryn Bee, Medical Student 06/21/2016, 2:24 PM  Pager: [phone]

## 2016-06-21 NOTE — ED Provider Notes (Signed)
[facility]-EMERGENCY DEPT Provider Note   CSN: [id number] Arrival date & time: 06/21/16  1131     History   Chief Complaint Chief Complaint  Patient presents with  . Code Stroke    HPI Justin Fitzgerald is a 56 y.o. male.  HPI Patient with history of diabetes, hypertension and renal insufficiency presents with left-sided weakness upon waking this morning. Last seen normal was at midnight yesterday evening when he went to sleep. States wife noticed slurred speech and that he dropped his coffee cup. Is having left-sided weakness. Denies visual changes or headache. Past Medical History:  Diagnosis Date  . ESRD (end stage renal disease) on dialysis (HCC)    "TTS; [facility] [place]" (06/22/2016)  . History of gout   . Hyperlipidemia   . Hypertension   . Peripheral vascular disease (HCC)   . Stroke (HCC) 06/20/2016   denies residual on 06/22/2016  . Type II diabetes mellitus [facility])     Patient Active Problem List   Diagnosis Date Noted  . Chronic combined systolic and diastolic congestive heart failure (HCC)   . CVA (cerebral vascular accident) (HCC) 06/21/2016  . Acute ischemic stroke (HCC) 06/21/2016  . Peripheral vascular disease (HCC) 06/21/2016  . Acute respiratory failure (HCC) 05/11/2016  . Peritonitis (HCC) 05/11/2016  . End stage renal disease (HCC) 03/27/2016  . Complication from renal dialysis device 03/27/2016  . Chronic renal insufficiency, stage 4 (severe) (HCC) 02/28/2016  . Lymphedema 02/28/2016  . Essential hypertension 02/28/2016  . Type 2 diabetes mellitus (HCC) 02/28/2016    Past Surgical History:  Procedure Laterality Date  . EXTERIORIZATION OF A CONTINUOUS AMBULATORY PERITONEAL DIALYSIS CATHETER    . FRACTURE SURGERY    . PERIPHERAL VASCULAR CATHETERIZATION N/A 03/08/2016   Procedure: Dialysis/Perma Catheter Insertion;  Surgeon: Renford Dills, MD;  Location: [facility] INVASIVE CV LAB;  Service: Cardiovascular;  Laterality: N/A;  . PERIPHERAL VASCULAR  CATHETERIZATION N/A 04/20/2016   Procedure: Dialysis/Perma Catheter Removal;  Surgeon: Annice Needy, MD;  Location: [facility] INVASIVE CV LAB;  Service: Cardiovascular;  Laterality: N/A;  . PERIPHERAL VASCULAR CATHETERIZATION N/A 05/25/2016   Procedure: Dialysis/Perma Catheter Insertion;  Surgeon: Annice Needy, MD;  Location: [facility] INVASIVE CV LAB;  Service: Cardiovascular;  Laterality: N/A;  . WRIST FRACTURE SURGERY Right ~ 2000   "got 8 screws in there"       Home Medications    Prior to Admission medications   Medication Sig Start Date End Date Taking? Authorizing Provider  cinacalcet (SENSIPAR) 30 MG tablet Take 4 tablets (120 mg total) by mouth daily with supper. 05/12/16  Yes Alford Highland, MD  clopidogrel (PLAVIX) 75 MG tablet Take 75 mg by mouth daily. 05/19/16  Yes Historical Provider, MD  furosemide (LASIX) 80 MG tablet Take 160 mg by mouth 2 (two) times daily.   Yes Historical Provider, MD  gabapentin (NEURONTIN) 300 MG capsule Take 300 mg by mouth at bedtime.  02/08/16  Yes Historical Provider, MD  sevelamer carbonate (RENVELA) 2.4 g PACK Take 2.4 g by mouth 3 (three) times daily with meals. 05/12/16  Yes Alford Highland, MD  SPIRIVA HANDIHALER 18 MCG inhalation capsule Place 18 mcg into inhaler and inhale daily.  02/21/16  Yes Historical Provider, MD  beclomethasone (QVAR) 40 MCG/ACT inhaler Inhale 1 puff into the lungs 2 (two) times daily. Patient not taking: Reported on 06/21/2016 05/12/16   Alford Highland, MD    Family History Family History  Problem Relation Age of Onset  . Hypertension Mother   .  CAD Mother     Social History Social History  Substance Use Topics  . Smoking status: Current Every Day Smoker    Packs/day: 0.50    Years: 40.00    Types: Cigarettes  . Smokeless tobacco: Former User    Types: Chew     Comment: 06/22/2016 "quit chewing when I was 1 or 56 years old"  . Alcohol use No     Allergies   Ramipril and Aspirin   Review of Systems Review of  Systems  Constitutional: Negative for chills and fever.  HENT: Negative for facial swelling.   Eyes: Negative for visual disturbance.  Respiratory: Negative for cough and shortness of breath.   Cardiovascular: Negative for chest pain, palpitations and leg swelling.  Gastrointestinal: Negative for abdominal pain, diarrhea, nausea and vomiting.  Musculoskeletal: Negative for back pain and neck pain.  Skin: Negative for rash and wound.  Neurological: Positive for weakness. Negative for dizziness, light-headedness, numbness and headaches.  All other systems reviewed and are negative.    Physical Exam Updated Vital Signs BP (!) 100/53 (BP Location: Right Arm)   Pulse 92   Temp 98.9 F (37.2 C) (Oral)   Resp 18   Ht 6' (1.829 m) Comment: recorded on 05/25/16  Wt (!) 306 lb (138.8 kg)   SpO2 99%   BMI 41.50 kg/m   Physical Exam  Constitutional: He is oriented to person, place, and time. He appears well-developed and well-nourished. No distress.  HENT:  Head: Normocephalic and atraumatic.  Mouth/Throat: Oropharynx is clear and moist. No oropharyngeal exudate.  Eyes: EOM are normal. Pupils are equal, round, and reactive to light.  Neck: Normal range of motion. Neck supple.  Cardiovascular: Normal rate and regular rhythm.  Exam reveals no gallop and no friction rub.   No murmur heard. Pulmonary/Chest: Effort normal and breath sounds normal. No respiratory distress. He has no wheezes. He has no rales. He exhibits no tenderness.  Abdominal: Soft. Bowel sounds are normal. There is no tenderness. There is no rebound and no guarding.  Peritoneal cath in place.  Musculoskeletal: Normal range of motion. He exhibits no edema or tenderness.  Neurological: He is alert and oriented to person, place, and time.  Left upper extremity and lower extremity drift. Sensation intact. Left facial droop. Finger to nose testing intact  Skin: Skin is warm and dry. No rash noted. No erythema.  Psychiatric: He  has a normal mood and affect. His behavior is normal.  Nursing note and vitals reviewed.    ED Treatments / Results  Labs (all labs ordered are listed, but only abnormal results are displayed) Labs Reviewed  CBC - Abnormal; Notable for the following:       Result Value   RBC 3.35 (*)    Hemoglobin 9.4 (*)    HCT 30.8 (*)    RDW 17.5 (*)    All other components within normal limits  COMPREHENSIVE METABOLIC PANEL - Abnormal; Notable for the following:    Chloride 99 (*)    BUN 24 (*)    Creatinine, Ser 6.68 (*)    Albumin 2.9 (*)    ALT 14 (*)    GFR calc non Af Amer 8 (*)    GFR calc Af Amer 10 (*)    All other components within normal limits  URINALYSIS, ROUTINE W REFLEX MICROSCOPIC - Abnormal; Notable for the following:    Glucose, UA 50 (*)    Hgb urine dipstick SMALL (*)    Protein,  ur 100 (*)    Squamous Epithelial / LPF 0-5 (*)    All other components within normal limits  LIPID PANEL - Abnormal; Notable for the following:    Triglycerides 185 (*)    HDL 35 (*)    All other components within normal limits  RENAL FUNCTION PANEL - Abnormal; Notable for the following:    Chloride 96 (*)    Glucose, Bld 106 (*)    BUN 32 (*)    Creatinine, Ser 8.06 (*)    Calcium 8.7 (*)    Phosphorus 6.2 (*)    Albumin 2.7 (*)    GFR calc non Af Amer 7 (*)    GFR calc Af Amer 8 (*)    All other components within normal limits  CBC - Abnormal; Notable for the following:    RBC 3.38 (*)    Hemoglobin 9.4 (*)    HCT 30.8 (*)    RDW 17.1 (*)    All other components within normal limits  CBC - Abnormal; Notable for the following:    RBC 3.44 (*)    Hemoglobin 9.7 (*)    HCT 31.4 (*)    RDW 17.0 (*)    All other components within normal limits  HEPARIN LEVEL (UNFRACTIONATED) - Abnormal; Notable for the following:    Heparin Unfractionated 0.13 (*)    All other components within normal limits  GLUCOSE, CAPILLARY - Abnormal; Notable for the following:    Glucose-Capillary 115  (*)    All other components within normal limits  RENAL FUNCTION PANEL - Abnormal; Notable for the following:    Chloride 99 (*)    Creatinine, Ser 5.91 (*)    Phosphorus 5.1 (*)    Albumin 3.1 (*)    GFR calc non Af Amer 10 (*)    GFR calc Af Amer 11 (*)    All other components within normal limits  I-STAT CHEM 8, ED - Abnormal; Notable for the following:    Potassium 3.4 (*)    Chloride 98 (*)    BUN 25 (*)    Creatinine, Ser 7.00 (*)    Calcium, Ion 1.08 (*)    Hemoglobin 9.9 (*)    HCT 29.0 (*)    All other components within normal limits  CBG MONITORING, ED - Abnormal; Notable for the following:    Glucose-Capillary 103 (*)    All other components within normal limits  MRSA PCR SCREENING  ETHANOL  PROTIME-INR  APTT  DIFFERENTIAL  RAPID URINE DRUG SCREEN, HOSP PERFORMED  HEMOGLOBIN A1C  GLUCOSE, CAPILLARY  GLUCOSE, CAPILLARY  GLUCOSE, CAPILLARY  GLUCOSE, CAPILLARY  GLUCOSE, CAPILLARY  TSH  GLUCOSE, CAPILLARY  I-STAT TROPOININ, ED  CBG MONITORING, ED    EKG  EKG Interpretation  Date/Time:  Wednesday June 21 2016 12:02:55 EST Ventricular Rate:  88 PR Interval:    QRS Duration: 96 QT Interval:  387 QTC Calculation: 469 R Axis:   45 Text Interpretation:  Sinus rhythm Prolonged PR interval Abnormal R-wave progression, early transition Borderline repolarization abnormality Confirmed by Lincoln Brigham [phone]) on 06/22/2016 7:29:03 PM       Radiology Mr Maxine Glenn Head Wo Contrast  Result Date: 06/21/2016 CLINICAL DATA:  LEFT facial droop and slurred speech beginning this morning. LEFT hand weakness. Follow-up code stroke. History of end-stage on dialysis, hypertension, hyperlipidemia and diabetes. EXAM: MR HEAD WITHOUT CONTRAST MR CIRCLE OF WILLIS WITHOUT CONTRAST MRA OF THE NECK WITHOUT CONTRAST TECHNIQUE: Multiplanar, multiecho pulse sequences of the  brain, circle of Willis and surrounding structures were obtained without intravenous contrast. Angiographic images of the  neck were obtained using MRA technique without intravenous contrast. COMPARISON:  CT HEAD June 21, 2016 at 1141 hours FINDINGS: MR HEAD FINDINGS- multiple sequences are mildly motion degraded. BRAIN: Subcentimeter focus of reduced diffusion RIGHT frontal and parietal lobes with low ADC values. Patchy reduced diffusion RIGHT basal ganglia and insula with normalizing ADC values in intermediate to bright FLAIR T2 hyperintense signal. No susceptibility artifact to suggest hemorrhage. The ventricles and sulci are normal for patient's age. Greater than expected scattered subcentimeter supratentorial white matter FLAIR T2 hyperintensities, exclusive of the aforementioned abnormality. No mass effect. No abnormal extra-axial fluid collections. VASCULAR: Normal major intracranial vascular flow voids present at skull base. SKULL AND UPPER CERVICAL SPINE: No abnormal sellar expansion. No suspicious calvarial bone marrow signal. Craniocervical junction maintained. SINUSES/ORBITS: Mild paranasal sinus mucosal thickening. Mastoid air cells are well aerated. The included ocular globes and orbital contents are non-suspicious. OTHER: None. MR CIRCLE OF WILLIS FINDINGS- moderately motion degraded examination . ANTERIOR CIRCULATION: Normal flow related enhancement of the included cervical, petrous, cavernous and supraclinoid internal carotid arteries. Patent anterior communicating artery. Flow related enhancement of the anterior and middle cerebral arteries, including distal segments. Moderate stenosis distal RIGHT M1 and severe RIGHT M2 origins. Moderate stenosis proximal LEFT M2 origin, superior division. No large vessel occlusion, abnormal luminal irregularity, aneurysm. POSTERIOR CIRCULATION: LEFT vertebral artery is dominant. Basilar artery is patent, with normal flow related enhancement of the main branch vessels. Mild luminal irregularity bilateral vertebral arteries compatible with atherosclerosis. Normal flow related  enhancement of the posterior cerebral arteries. No large vessel occlusion, high-grade stenosis, abnormal luminal irregularity, aneurysm. ANATOMIC VARIANTS: None. MRA NECK FINDINGS- limited noncontrast, moderately motion degraded examination. ANTERIOR CIRCULATION: The common carotid arteries are widely patent bilaterally origins not imaged due to time-of-flight technique. The carotid bifurcation is patent bilaterally and there is no hemodynamically significant carotid stenosis by NASCET criteria. No evidence for atherosclerosis or flow limiting stenosis of the cervical internal carotid arteries. POSTERIOR CIRCULATION: Bilateral vertebral arteries are patent to the vertebrobasilar junction, origins not imaged. No evidence for atherosclerosis or flow limiting stenosis. Source images and MIP image were reviewed. IMPRESSION: MRI HEAD: Mildly motion degraded examination. Multifocal acute on subacute RIGHT MCA territory infarcts including RIGHT basal ganglia. Mild to moderate white matter changes compatible chronic small vessel ischemic disease. MRA HEAD: Moderately motion degraded examination. No emergent large vessel occlusion. Severe stenosis RIGHT M2 origin. Moderate stenosis RIGHT M1 and LEFT M2 origin. MRA NECK: Negative noncontrast moderately motion degraded examination. Electronically Signed   By: Awilda Metro M.D.   On: 06/21/2016 21:18   Mr Maxine Glenn Neck Wo Contrast  Result Date: 06/21/2016 CLINICAL DATA:  LEFT facial droop and slurred speech beginning this morning. LEFT hand weakness. Follow-up code stroke. History of end-stage on dialysis, hypertension, hyperlipidemia and diabetes. EXAM: MR HEAD WITHOUT CONTRAST MR CIRCLE OF WILLIS WITHOUT CONTRAST MRA OF THE NECK WITHOUT CONTRAST TECHNIQUE: Multiplanar, multiecho pulse sequences of the brain, circle of Willis and surrounding structures were obtained without intravenous contrast. Angiographic images of the neck were obtained using MRA technique without  intravenous contrast. COMPARISON:  CT HEAD June 21, 2016 at 1141 hours FINDINGS: MR HEAD FINDINGS- multiple sequences are mildly motion degraded. BRAIN: Subcentimeter focus of reduced diffusion RIGHT frontal and parietal lobes with low ADC values. Patchy reduced diffusion RIGHT basal ganglia and insula with normalizing ADC values in intermediate to bright FLAIR T2 hyperintense  signal. No susceptibility artifact to suggest hemorrhage. The ventricles and sulci are normal for patient's age. Greater than expected scattered subcentimeter supratentorial white matter FLAIR T2 hyperintensities, exclusive of the aforementioned abnormality. No mass effect. No abnormal extra-axial fluid collections. VASCULAR: Normal major intracranial vascular flow voids present at skull base. SKULL AND UPPER CERVICAL SPINE: No abnormal sellar expansion. No suspicious calvarial bone marrow signal. Craniocervical junction maintained. SINUSES/ORBITS: Mild paranasal sinus mucosal thickening. Mastoid air cells are well aerated. The included ocular globes and orbital contents are non-suspicious. OTHER: None. MR CIRCLE OF WILLIS FINDINGS- moderately motion degraded examination . ANTERIOR CIRCULATION: Normal flow related enhancement of the included cervical, petrous, cavernous and supraclinoid internal carotid arteries. Patent anterior communicating artery. Flow related enhancement of the anterior and middle cerebral arteries, including distal segments. Moderate stenosis distal RIGHT M1 and severe RIGHT M2 origins. Moderate stenosis proximal LEFT M2 origin, superior division. No large vessel occlusion, abnormal luminal irregularity, aneurysm. POSTERIOR CIRCULATION: LEFT vertebral artery is dominant. Basilar artery is patent, with normal flow related enhancement of the main branch vessels. Mild luminal irregularity bilateral vertebral arteries compatible with atherosclerosis. Normal flow related enhancement of the posterior cerebral arteries. No  large vessel occlusion, high-grade stenosis, abnormal luminal irregularity, aneurysm. ANATOMIC VARIANTS: None. MRA NECK FINDINGS- limited noncontrast, moderately motion degraded examination. ANTERIOR CIRCULATION: The common carotid arteries are widely patent bilaterally origins not imaged due to time-of-flight technique. The carotid bifurcation is patent bilaterally and there is no hemodynamically significant carotid stenosis by NASCET criteria. No evidence for atherosclerosis or flow limiting stenosis of the cervical internal carotid arteries. POSTERIOR CIRCULATION: Bilateral vertebral arteries are patent to the vertebrobasilar junction, origins not imaged. No evidence for atherosclerosis or flow limiting stenosis. Source images and MIP image were reviewed. IMPRESSION: MRI HEAD: Mildly motion degraded examination. Multifocal acute on subacute RIGHT MCA territory infarcts including RIGHT basal ganglia. Mild to moderate white matter changes compatible chronic small vessel ischemic disease. MRA HEAD: Moderately motion degraded examination. No emergent large vessel occlusion. Severe stenosis RIGHT M2 origin. Moderate stenosis RIGHT M1 and LEFT M2 origin. MRA NECK: Negative noncontrast moderately motion degraded examination. Electronically Signed   By: Awilda Metro M.D.   On: 06/21/2016 21:18   Mr Brain Wo Contrast  Result Date: 06/21/2016 CLINICAL DATA:  LEFT facial droop and slurred speech beginning this morning. LEFT hand weakness. Follow-up code stroke. History of end-stage on dialysis, hypertension, hyperlipidemia and diabetes. EXAM: MR HEAD WITHOUT CONTRAST MR CIRCLE OF WILLIS WITHOUT CONTRAST MRA OF THE NECK WITHOUT CONTRAST TECHNIQUE: Multiplanar, multiecho pulse sequences of the brain, circle of Willis and surrounding structures were obtained without intravenous contrast. Angiographic images of the neck were obtained using MRA technique without intravenous contrast. COMPARISON:  CT HEAD June 21, 2016  at 1141 hours FINDINGS: MR HEAD FINDINGS- multiple sequences are mildly motion degraded. BRAIN: Subcentimeter focus of reduced diffusion RIGHT frontal and parietal lobes with low ADC values. Patchy reduced diffusion RIGHT basal ganglia and insula with normalizing ADC values in intermediate to bright FLAIR T2 hyperintense signal. No susceptibility artifact to suggest hemorrhage. The ventricles and sulci are normal for patient's age. Greater than expected scattered subcentimeter supratentorial white matter FLAIR T2 hyperintensities, exclusive of the aforementioned abnormality. No mass effect. No abnormal extra-axial fluid collections. VASCULAR: Normal major intracranial vascular flow voids present at skull base. SKULL AND UPPER CERVICAL SPINE: No abnormal sellar expansion. No suspicious calvarial bone marrow signal. Craniocervical junction maintained. SINUSES/ORBITS: Mild paranasal sinus mucosal thickening. Mastoid air cells are well aerated.  The included ocular globes and orbital contents are non-suspicious. OTHER: None. MR CIRCLE OF WILLIS FINDINGS- moderately motion degraded examination . ANTERIOR CIRCULATION: Normal flow related enhancement of the included cervical, petrous, cavernous and supraclinoid internal carotid arteries. Patent anterior communicating artery. Flow related enhancement of the anterior and middle cerebral arteries, including distal segments. Moderate stenosis distal RIGHT M1 and severe RIGHT M2 origins. Moderate stenosis proximal LEFT M2 origin, superior division. No large vessel occlusion, abnormal luminal irregularity, aneurysm. POSTERIOR CIRCULATION: LEFT vertebral artery is dominant. Basilar artery is patent, with normal flow related enhancement of the main branch vessels. Mild luminal irregularity bilateral vertebral arteries compatible with atherosclerosis. Normal flow related enhancement of the posterior cerebral arteries. No large vessel occlusion, high-grade stenosis, abnormal luminal  irregularity, aneurysm. ANATOMIC VARIANTS: None. MRA NECK FINDINGS- limited noncontrast, moderately motion degraded examination. ANTERIOR CIRCULATION: The common carotid arteries are widely patent bilaterally origins not imaged due to time-of-flight technique. The carotid bifurcation is patent bilaterally and there is no hemodynamically significant carotid stenosis by NASCET criteria. No evidence for atherosclerosis or flow limiting stenosis of the cervical internal carotid arteries. POSTERIOR CIRCULATION: Bilateral vertebral arteries are patent to the vertebrobasilar junction, origins not imaged. No evidence for atherosclerosis or flow limiting stenosis. Source images and MIP image were reviewed. IMPRESSION: MRI HEAD: Mildly motion degraded examination. Multifocal acute on subacute RIGHT MCA territory infarcts including RIGHT basal ganglia. Mild to moderate white matter changes compatible chronic small vessel ischemic disease. MRA HEAD: Moderately motion degraded examination. No emergent large vessel occlusion. Severe stenosis RIGHT M2 origin. Moderate stenosis RIGHT M1 and LEFT M2 origin. MRA NECK: Negative noncontrast moderately motion degraded examination. Electronically Signed   By: Awilda Metro M.D.   On: 06/21/2016 21:18    Procedures Procedures (including critical care time)  Medications Ordered in ED Medications  cinacalcet (SENSIPAR) tablet 120 mg (120 mg Oral Given 06/22/16 1847)  sevelamer carbonate (RENVELA) powder PACK 2.4 g (2.4 g Oral Not Given 06/23/16 1200)  gabapentin (NEURONTIN) capsule 300 mg (300 mg Oral Given 06/22/16 2104)  tiotropium (SPIRIVA) inhalation capsule 18 mcg (18 mcg Inhalation Given 06/23/16 0834)  budesonide (PULMICORT) nebulizer solution 0.25 mg (0.25 mg Nebulization Given 06/23/16 0830)  albuterol (PROVENTIL) (2.5 MG/3ML) 0.083% nebulizer solution 2.5 mg (not administered)  insulin aspart (novoLOG) injection 0-9 Units (0 Units Subcutaneous Not Given 06/23/16 1143)    insulin aspart (novoLOG) injection 0-5 Units (0 Units Subcutaneous Not Given 06/22/16 2153)  atorvastatin (LIPITOR) tablet 80 mg (80 mg Oral Given 06/22/16 1846)  pentafluoroprop-tetrafluoroeth (GEBAUERS) aerosol 1 application (not administered)  lidocaine (PF) (XYLOCAINE) 1 % injection 5 mL (not administered)  lidocaine-prilocaine (EMLA) cream 1 application (not administered)  0.9 %  sodium chloride infusion (not administered)  0.9 %  sodium chloride infusion (not administered)  heparin injection 1,000 Units (not administered)  alteplase (CATHFLO ACTIVASE) injection 2 mg (not administered)  heparin injection 2,900 Units (not administered)  perflutren lipid microspheres (DEFINITY) IV suspension (3 mLs Intravenous Given 06/22/16 1200)  sodium chloride flush (NS) 0.9 % injection 3 mL (3 mLs Intravenous Given 06/23/16 1012)  sodium chloride flush (NS) 0.9 % injection 3 mL (3 mLs Intravenous Given 06/23/16 1011)  0.9 %  sodium chloride infusion (not administered)  aspirin EC tablet 81 mg (not administered)  0.9 %  sodium chloride infusion ( Intravenous New Bag/Given 06/23/16 0643)  clopidogrel (PLAVIX) tablet 75 mg (75 mg Oral Given 06/23/16 1142)  heparin injection 5,000 Units (not administered)   stroke: mapping our early stages  of recovery book ( Does not apply Given 06/21/16 1715)  aspirin chewable tablet 81 mg (81 mg Oral Given 06/23/16 1610)     Initial Impression / Assessment and Plan / ED Course  I have reviewed the triage vital signs and the nursing notes.  Pertinent labs & imaging results that were available during my care of the patient were reviewed by me and considered in my medical decision making (see chart for details).     Neurology at bedside. Patient is not a TPA candidate. Discussed with resident on call for [place]. Will see patient in emergency department and admit.  Final Clinical Impressions(s) / ED Diagnoses   Final diagnoses:  Stroke (cerebrum) (HCC)  Acute ischemic stroke [facility])     New Prescriptions Current Discharge Medication List       Loren Racer, MD 06/23/16 1614

## 2016-06-21 NOTE — Consult Note (Signed)
Requesting Physician: Dr. Ranae Palms, EDP    Chief Complaint: Left facial droop, slurred speech  History obtained from:  Patient     HPI:                                                                                                                                         Justin Fitzgerald is an 56 y.o. man with PMHx of ESRD on peritoneal dialysis, HTN, type 2 DM, hyperlipidemia, and peripheral vascular disease presenting as a code stroke. Per patient, he woke up around 7AM this morning and did not notice anything different. Around 8:30AM he dropped his cup of coffee and when his wife came to check on him she noticed his left facial droop and slurring of his speech. EMS was called and code stroke activated. Per patient, his wife also noticed he was having difficulty walking. Patient reports left lower extremity weakness. Last known normal around 12AM when patient went to bed.   Date last known well: 06/21/16 Time last known well: 12AM tPA Given: No: out of window  Modified Rankin: Rankin Score=1  NIHSS score 5    Past Medical History:  Diagnosis Date  . Chronic kidney disease   . Diabetes mellitus without complication (HCC)   . Hyperlipidemia   . Hypertension   . Peripheral vascular disease [facility])     Past Surgical History:  Procedure Laterality Date  . arm surgery Right   . EXTERIORIZATION OF A CONTINUOUS AMBULATORY PERITONEAL DIALYSIS CATHETER    . PERIPHERAL VASCULAR CATHETERIZATION N/A 03/08/2016   Procedure: Dialysis/Perma Catheter Insertion;  Surgeon: Renford Dills, MD;  Location: [facility] INVASIVE CV LAB;  Service: Cardiovascular;  Laterality: N/A;  . PERIPHERAL VASCULAR CATHETERIZATION N/A 04/20/2016   Procedure: Dialysis/Perma Catheter Removal;  Surgeon: Annice Needy, MD;  Location: [facility] INVASIVE CV LAB;  Service: Cardiovascular;  Laterality: N/A;  . PERIPHERAL VASCULAR CATHETERIZATION N/A 05/25/2016   Procedure: Dialysis/Perma Catheter Insertion;  Surgeon: Annice Needy, MD;   Location: [facility] INVASIVE CV LAB;  Service: Cardiovascular;  Laterality: N/A;  . WRIST SURGERY Right     Family History  Problem Relation Age of Onset  . Hypertension Mother   . CAD Mother    Social History:  reports that he has been smoking Cigarettes.  He has been smoking about 1.00 pack per day. He uses smokeless tobacco. He reports that he does not drink alcohol or use drugs.  Allergies:  Allergies  Allergen Reactions  . Ramipril Shortness Of Breath  . Aspirin     kidney disease    Medications:  I have reviewed the patient's current medications.  ROS:                                                                                                                                       History obtained from the patient  General ROS: negative for - chills, fatigue, fever, night sweats, weight gain or weight loss Psychological ROS: negative for - behavioral disorder, hallucinations, memory difficulties, mood swings or suicidal ideation Ophthalmic ROS: negative for - blurry vision, double vision, eye pain or loss of vision ENT ROS: negative for - epistaxis, nasal discharge, oral lesions, sore throat, tinnitus or vertigo Allergy and Immunology ROS: negative for - hives or itchy/watery eyes Hematological and Lymphatic ROS: negative for - bleeding problems, bruising or swollen lymph nodes Endocrine ROS: negative for - galactorrhea, hair pattern changes, polydipsia/polyuria or temperature intolerance Respiratory ROS: negative for - cough, hemoptysis, shortness of breath or wheezing Cardiovascular ROS: negative for - chest pain, dyspnea on exertion, edema or irregular heartbeat Gastrointestinal ROS: negative for - abdominal pain, diarrhea, hematemesis, nausea/vomiting or stool incontinence Genito-Urinary ROS: negative for - dysuria, hematuria, incontinence or urinary  frequency/urgency Musculoskeletal ROS: negative for - joint swelling or muscular weakness Neurological ROS: as noted in HPI Dermatological ROS: negative for rash and skin lesion changes  Neurologic Examination:                                                                                                      Blood pressure 111/67, pulse 90, temperature 98.1 F (36.7 C), temperature source Oral, resp. rate 16, weight (!) 320 lb 5.3 oz (145.3 kg), SpO2 100 %.  HEENT-  [place]/AT, EOMI, sclera anicteric, mucus membranes moist Cardiovascular- RRR, no m/g/r Lungs- CTA bilaterally, breaths non-labored  Abdomen- BS+, soft, non-tender, peritoneal catheter in place Extremities- no peripheral edema Lymph-no adenopathy palpable Musculoskeletal-no joint tenderness, deformity or swelling Skin-warm and dry, no hyperpigmentation, vitiligo, or suspicious lesions  Neurological Examination Mental Status: Alert and oriented to person, place, time, and situation. Mild slurring of speech noted. Cranial Nerves: II: Visual fields grossly normal,  III,IV, VI: ptosis not present, extra-ocular motions intact bilaterally, pupils equal, round, reactive to light and accommodation V,VII: left facial droop noted, facial light touch sensation normal bilaterally VIII: hearing normal bilaterally IX,X: uvula rises symmetrically XI: bilateral shoulder shrug XII: midline tongue extension Motor: Right : Upper extremity   5/5    Left:     Upper extremity   5/5  Lower extremity  5/5     Lower extremity   4+/5, slight drift Tone and bulk:normal tone throughout; no atrophy noted Sensory: Decreased sensation to light touch in left lower extremity Deep Tendon Reflexes: 2+ and symmetric throughout Plantars: Right: downgoing   Left: downgoing Cerebellar: normal finger-to-nose  and normal heel-to-shin test Gait: Unable to assess       Lab Results: Basic Metabolic Panel:  Recent Labs Lab 06/21/16 1205 06/21/16 1223   NA 137 137  K 3.6 3.4*  CL 99* 98*  CO2 25  --   GLUCOSE 98 99  BUN 24* 25*  CREATININE 6.68* 7.00*  CALCIUM 8.9  --     Liver Function Tests:  Recent Labs Lab 06/21/16 1205  AST 19  ALT 14*  ALKPHOS 101  BILITOT 0.4  PROT 6.7  ALBUMIN 2.9*    CBC:  Recent Labs Lab 06/21/16 1205 06/21/16 1223  WBC 7.3  --   NEUTROABS 4.8  --   HGB 9.4* 9.9*  HCT 30.8* 29.0*  MCV 91.9  --   PLT 246  --     CBG:  Recent Labs Lab 06/21/16 1135  GLUCAP 95    Coagulation Studies:  Recent Labs  06/21/16 1205  LABPROT 12.9  INR 0.97    Imaging: Ct Head Code Stroke W/o Cm  Result Date: 06/21/2016 CLINICAL DATA:  Code stroke. Code stroke. Left-sided weakness and slurred speech. Facial droop. EXAM: CT HEAD WITHOUT CONTRAST TECHNIQUE: Contiguous axial images were obtained from the base of the skull through the vertex without intravenous contrast. COMPARISON:  None. FINDINGS: Brain: There is subtle loss of gray-white differentiation along the posterior right insula and in the right lentiform nucleus. Fall this could be artifact, early ischemia is certainly considered. Acute hemorrhage or mass lesion is present. Cortex over the convexities is within normal limits. The ventricles are of normal size. No significant extra-axial fluid collection is present. Vascular: Atherosclerotic calcifications are present within the cavernous internal carotid arteries. There is no hyperdense vessel. A calcification is evident near the right MCA bifurcation. There is no hyperdense vessel. Skull: The calvarium is within normal limits. Sinuses/Orbits: Chronic bilateral maxillary sinus disease is present. There is soft tissue opacification of the ostiomeatal complex bilaterally. This may represent polyp point disease. The globes and orbits are within normal limits. ASPECTS [facility] Stroke Program Early CT Score) - Ganglionic level infarction (caudate, lentiform nuclei, internal capsule, insula, M1-M3 cortex):  5/7. Points lost for the posterior right insula and lentiform nucleus. - Supraganglionic infarction (M4-M6 cortex): 3/3 Total score (0-10 with 10 being normal): 8/10 IMPRESSION: 1. Subtle loss of gray-white differentiation in the posterior right insula and lentiform nucleus suggesting acute ischemia. 2. Chronic bilateral maxillary sinus disease 3. ASPECTS is 8/10 Electronically Signed   By: Marin Roberts M.D.   On: 06/21/2016 11:58       Assessment: Mr. Shirkey is a 56yo man presenting with left facial droop, left lower extremity weakness, slurred speech, and decreased sensation in his left lower extremity concerning for acute stroke.   Stroke Risk Factors - diabetes mellitus, hyperlipidemia and hypertension  Acute CVA: Suspected right-sided CVA with symptoms. CT head showing some subtle loss of gray-white differentiation in the posterior insula and lentiform nucleus suggesting acute ischemia. Patient not a tPA candidate due to being outside the time window. Patient already on Plavix at home due to his peripheral vascular disease. Will do full stroke work up. - Continue Plavix 75 mg daily  - MRI brain without contrast -  MRA head/neck without contrast  - Echocardiogram - Lipid panel/HbA1c - NPO - PT/OT/SLP - Allow permissive HTN    Attending note to follow   Rich Number, MD, MPH Internal Medicine Resident, PGY-III Pager: [phone]

## 2016-06-21 NOTE — ED Notes (Signed)
Attempted report x1. 

## 2016-06-21 NOTE — ED Notes (Signed)
Admitting at bedside 

## 2016-06-21 NOTE — Code Documentation (Signed)
56 y.o. Male presents to [facility] ED via [facility]. The patient was stated to wake up this morning at around 0700. At around 08:30 the patient's wife stated that she heard her husband drop a cup and went to check on him. At that time his wife stated he had a left sided facial droop and was noted to be slurring his speech. EMS was called and code stroke activated. Pt arrives to [facility] and labs were drawn and CT performed. NIHSS 5. See EMR for NIHSS and code stroke times. NIHSS scored for left sided facial droop, LUE drift and decreased sensation to the left side. CT showed subtle loss of gray-white differentiation in the posterior right insula and lentiform nucleus. ASPECTS is 8/10. Upon further discussion with the patient, he stated he did not feel right when he woke up this morning, he also stated he went to bed last night around 23:30. tPA not given d/t being out of the window. Not an IR candidate d/t symptoms being too mild per neurologist. Bedside handoff with ED RN Irving Burton

## 2016-06-22 ENCOUNTER — Encounter (HOSPITAL_COMMUNITY): Payer: Self-pay | Admitting: General Practice

## 2016-06-22 ENCOUNTER — Inpatient Hospital Stay (HOSPITAL_COMMUNITY): Payer: Medicare Other

## 2016-06-22 DIAGNOSIS — I10 Essential (primary) hypertension: Secondary | ICD-10-CM

## 2016-06-22 DIAGNOSIS — I120 Hypertensive chronic kidney disease with stage 5 chronic kidney disease or end stage renal disease: Secondary | ICD-10-CM

## 2016-06-22 DIAGNOSIS — I429 Cardiomyopathy, unspecified: Secondary | ICD-10-CM

## 2016-06-22 DIAGNOSIS — Z992 Dependence on renal dialysis: Secondary | ICD-10-CM

## 2016-06-22 DIAGNOSIS — J984 Other disorders of lung: Secondary | ICD-10-CM

## 2016-06-22 DIAGNOSIS — D631 Anemia in chronic kidney disease: Secondary | ICD-10-CM

## 2016-06-22 DIAGNOSIS — F17210 Nicotine dependence, cigarettes, uncomplicated: Secondary | ICD-10-CM

## 2016-06-22 DIAGNOSIS — Z888 Allergy status to other drugs, medicaments and biological substances status: Secondary | ICD-10-CM

## 2016-06-22 DIAGNOSIS — Z7951 Long term (current) use of inhaled steroids: Secondary | ICD-10-CM

## 2016-06-22 DIAGNOSIS — Z8249 Family history of ischemic heart disease and other diseases of the circulatory system: Secondary | ICD-10-CM

## 2016-06-22 DIAGNOSIS — E1151 Type 2 diabetes mellitus with diabetic peripheral angiopathy without gangrene: Secondary | ICD-10-CM

## 2016-06-22 DIAGNOSIS — Z886 Allergy status to analgesic agent status: Secondary | ICD-10-CM

## 2016-06-22 DIAGNOSIS — I5042 Chronic combined systolic (congestive) and diastolic (congestive) heart failure: Secondary | ICD-10-CM

## 2016-06-22 LAB — GLUCOSE, CAPILLARY
GLUCOSE-CAPILLARY: 82 mg/dL (ref 65–99)
GLUCOSE-CAPILLARY: 75 mg/dL (ref 65–99)
GLUCOSE-CAPILLARY: 115 mg/dL — AB (ref 65–99)
Glucose-Capillary: 86 mg/dL (ref 65–99)

## 2016-06-22 LAB — RENAL FUNCTION PANEL
ANION GAP: 15 (ref 5–15)
Albumin: 2.7 g/dL — ABNORMAL LOW (ref 3.5–5.0)
BUN: 32 mg/dL — ABNORMAL HIGH (ref 6–20)
CO2: 26 mmol/L (ref 22–32)
Calcium: 8.7 mg/dL — ABNORMAL LOW (ref 8.9–10.3)
Chloride: 96 mmol/L — ABNORMAL LOW (ref 101–111)
Creatinine, Ser: 8.06 mg/dL — ABNORMAL HIGH (ref 0.61–1.24)
GFR calc Af Amer: 8 mL/min — ABNORMAL LOW (ref >60)
GFR calc non Af Amer: 7 mL/min — ABNORMAL LOW (ref >60)
GLUCOSE: 106 mg/dL — AB (ref 65–99)
Phosphorus: 6.2 mg/dL — ABNORMAL HIGH (ref 2.5–4.6)
Potassium: 3.7 mmol/L (ref 3.5–5.1)
SODIUM: 137 mmol/L (ref 135–145)

## 2016-06-22 LAB — RAPID URINE DRUG SCREEN, HOSP PERFORMED
AMPHETAMINES: NOT DETECTED
BENZODIAZEPINES: NOT DETECTED
Barbiturates: NOT DETECTED
Cocaine: NOT DETECTED
OPIATES: NOT DETECTED
Tetrahydrocannabinol: NOT DETECTED

## 2016-06-22 LAB — URINALYSIS, ROUTINE W REFLEX MICROSCOPIC
BACTERIA UA: NONE SEEN
BILIRUBIN URINE: NEGATIVE
Glucose, UA: 50 mg/dL — AB
Ketones, ur: NEGATIVE mg/dL
Leukocytes, UA: NEGATIVE
NITRITE: NEGATIVE
PROTEIN: 100 mg/dL — AB
RBC / HPF: NONE SEEN RBC/hpf (ref 0–5)
SPECIFIC GRAVITY, URINE: 1.008 (ref 1.005–1.030)
pH: 7 (ref 5.0–8.0)

## 2016-06-22 LAB — LIPID PANEL
CHOL/HDL RATIO: 4.9 ratio
CHOLESTEROL: 170 mg/dL (ref 0–200)
HDL: 35 mg/dL — ABNORMAL LOW (ref >40)
LDL Cholesterol: 98 mg/dL (ref 0–99)
TRIGLYCERIDES: 185 mg/dL — AB (ref <150)
VLDL: 37 mg/dL (ref 0–40)

## 2016-06-22 LAB — ECHOCARDIOGRAM COMPLETE: Weight: 5125.25 oz

## 2016-06-22 LAB — CBC
HEMATOCRIT: 30.8 % — AB (ref 39.0–52.0)
HEMOGLOBIN: 9.4 g/dL — AB (ref 13.0–17.0)
MCH: 27.8 pg (ref 26.0–34.0)
MCHC: 30.5 g/dL (ref 30.0–36.0)
MCV: 91.1 fL (ref 78.0–100.0)
Platelets: 251 10*3/uL (ref 150–400)
RBC: 3.38 MIL/uL — ABNORMAL LOW (ref 4.22–5.81)
RDW: 17.1 % — ABNORMAL HIGH (ref 11.5–15.5)
WBC: 7.2 10*3/uL (ref 4.0–10.5)

## 2016-06-22 LAB — MRSA PCR SCREENING: MRSA by PCR: NEGATIVE

## 2016-06-22 MED ORDER — HEPARIN SODIUM (PORCINE) 1000 UNIT/ML DIALYSIS
20.0000 [IU]/kg | INTRAMUSCULAR | Status: DC | PRN
  Filled 2016-06-22: qty 3

## 2016-06-22 MED ORDER — ALTEPLASE 2 MG IJ SOLR
2.0000 mg | Freq: Once | INTRAMUSCULAR | Status: DC | PRN
  Filled 2016-06-22: qty 2

## 2016-06-22 MED ORDER — PENTAFLUOROPROP-TETRAFLUOROETH EX AERO: 1.0000 "application " | INHALATION_SPRAY | CUTANEOUS | Status: DC | PRN

## 2016-06-22 MED ORDER — ATORVASTATIN CALCIUM 80 MG PO TABS
80.0000 mg | ORAL_TABLET | Freq: Every day | ORAL | Status: DC
  Administered 2016-06-22 – 2016-06-25 (×3): 80 mg via ORAL
  Filled 2016-06-22 (×3): qty 1

## 2016-06-22 MED ORDER — SODIUM CHLORIDE 0.9 % IV SOLN: 100.0000 mL | INTRAVENOUS | Status: DC | PRN

## 2016-06-22 MED ORDER — HEPARIN (PORCINE) IN NACL 100-0.45 UNIT/ML-% IJ SOLN: 12.0000 [IU]/kg/h | INTRAMUSCULAR | Status: DC

## 2016-06-22 MED ORDER — LIDOCAINE-PRILOCAINE 2.5-2.5 % EX CREA
1.0000 "application " | TOPICAL_CREAM | CUTANEOUS | Status: DC | PRN
  Filled 2016-06-22: qty 5

## 2016-06-22 MED ORDER — ASPIRIN EC 81 MG PO TBEC: 81.0000 mg | DELAYED_RELEASE_TABLET | Freq: Every day | ORAL | Status: DC

## 2016-06-22 MED ORDER — PERFLUTREN LIPID MICROSPHERE
1.0000 mL | INTRAVENOUS | Status: AC | PRN
  Administered 2016-06-22: 3 mL via INTRAVENOUS
  Filled 2016-06-22: qty 10

## 2016-06-22 MED ORDER — HEPARIN SODIUM (PORCINE) 1000 UNIT/ML DIALYSIS
1000.0000 [IU] | INTRAMUSCULAR | Status: DC | PRN
  Filled 2016-06-22: qty 1

## 2016-06-22 MED ORDER — LIDOCAINE HCL (PF) 1 % IJ SOLN: 5.0000 mL | INTRAMUSCULAR | Status: DC | PRN

## 2016-06-22 MED ORDER — HEPARIN (PORCINE) IN NACL 100-0.45 UNIT/ML-% IJ SOLN
1400.0000 [IU]/h | INTRAMUSCULAR | Status: DC
  Administered 2016-06-22: 1200 [IU]/h via INTRAVENOUS
  Filled 2016-06-22: qty 250

## 2016-06-22 NOTE — Consult Note (Addendum)
CONSULT NOTE  Date: 06/22/2016               Patient Name:  Justin Fitzgerald MRN: [id number]  DOB: 1961-02-26 Age / Sex: 56 y.o., male        PCP: DAVID BRONSTEIN Primary Cardiologist: Luvenia Heller             Referring Physician: Josem Kaufmann              Reason for Consult: Systolic CHF            History of Present Illness: Patient is a 56 y.o. male with a PMHx of ESRD, HTN, PVD , DM  who was admitted to [facility] on 06/21/2016 for evaluation of  CVA   Barney was admitted to the hospital with a stroke. Workup included an echocardiogram. His neurologic deficits have almost completely improved. Echo revealed severe LV dysfunction with EF of 20%. . Smoker   Joshuah has had high blood pressure for many years. He does not recall exactly how long. He has been fairly good about taking his medications..  He denies a chest pain or chest soreness. He does have some shortness of breath. He admits to eating some salt and eats fried foods on occasion. He does not eat out much.  He's had diabetes for many years. His glucose levels have been fairly well-controlled. He goes to the coronary clinic in [place].  He's been on peritoneal dialysis for 9 years and recently started doing hemodialysis. He still makes some urine.  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    Medications: Outpatient medications: Prescriptions Prior to Admission  Medication Sig Dispense Refill Last Dose  . cinacalcet (SENSIPAR) 30 MG tablet Take 4 tablets (120 mg total) by mouth daily with supper. 120 tablet 0 06/20/2016 at Unknown time  . clopidogrel (PLAVIX) 75 MG tablet Take 75 mg by mouth daily.   06/20/2016 at Unknown time  . furosemide (LASIX) 80 MG tablet Take 160 mg by mouth 2 (two) times daily.   06/20/2016 at Unknown time  . gabapentin (NEURONTIN) 300 MG capsule Take 300 mg by mouth at bedtime.    06/20/2016 at  Unknown time  . sevelamer carbonate (RENVELA) 2.4 g PACK Take 2.4 g by mouth 3 (three) times daily with meals. 90 each 0 06/20/2016 at Unknown time  . SPIRIVA HANDIHALER 18 MCG inhalation capsule Place 18 mcg into inhaler and inhale daily.    06/20/2016 at Unknown time  . beclomethasone (QVAR) 40 MCG/ACT inhaler Inhale 1 puff into the lungs 2 (two) times daily. (Patient not taking: Reported on 06/21/2016) 1 Inhaler 0 Not Taking at Unknown time    Current medications: Current Facility-Administered Medications  Medication Dose Route Frequency Provider Last Rate Last Dose  . 0.9 %  sodium chloride infusion  100 mL Intravenous PRN Lauris Poag, MD      . 0.9 %  sodium chloride infusion  100 mL Intravenous PRN Lauris Poag, MD      . albuterol (PROVENTIL) (2.5 MG/3ML) 0.083% nebulizer solution 2.5 mg  2.5 mg Nebulization Q6H PRN Thomasene Lot, MD      . alteplase (CATHFLO ACTIVASE) injection 2 mg  2 mg Intracatheter Once PRN Lauris Poag, MD      . Melene Muller ON 06/23/2016] aspirin EC tablet 81 mg  81 mg Oral Daily Darreld Mclean, MD      .  atorvastatin (LIPITOR) tablet 80 mg  80 mg Oral q1800 Darreld Mclean, MD      . budesonide (PULMICORT) nebulizer solution 0.25 mg  0.25 mg Nebulization BID Tasrif Ahmed, MD      . cinacalcet (SENSIPAR) tablet 120 mg  120 mg Oral Q supper Tasrif Ahmed, MD   120 mg at 06/21/16 2143  . gabapentin (NEURONTIN) capsule 300 mg  300 mg Oral QHS Tasrif Ahmed, MD   300 mg at 06/21/16 2143  . heparin ADULT infusion 100 units/mL (25000 units/226mL sodium chloride 0.45%)  1,200 Units/hr Intravenous Continuous Doneen Poisson, MD      . heparin injection 1,000 Units  1,000 Units Dialysis PRN Lauris Poag, MD      . heparin injection 2,900 Units  20 Units/kg Dialysis PRN Lauris Poag, MD      . insulin aspart (novoLOG) injection 0-5 Units  0-5 Units Subcutaneous QHS Tasrif Ahmed, MD      . insulin aspart (novoLOG) injection 0-9 Units  0-9 Units Subcutaneous TID WC Hyacinth Meeker, MD    Stopped at 06/21/16 1709  . lidocaine (PF) (XYLOCAINE) 1 % injection 5 mL  5 mL Intradermal PRN Lauris Poag, MD      . lidocaine-prilocaine (EMLA) cream 1 application  1 application Topical PRN Lauris Poag, MD      . pentafluoroprop-tetrafluoroeth (GEBAUERS) aerosol 1 application  1 application Topical PRN Lauris Poag, MD      . sevelamer carbonate (RENVELA) powder PACK 2.4 g  2.4 g Oral TID WC Tasrif Ahmed, MD      . tiotropium (SPIRIVA) inhalation capsule 18 mcg  18 mcg Inhalation Daily Hyacinth Meeker, MD         Allergies  Allergen Reactions  . Ramipril Shortness Of Breath  . Aspirin     kidney disease     Past Medical History:  Diagnosis Date  . Chronic kidney disease   . Diabetes mellitus without complication (HCC)   . Hyperlipidemia   . Hypertension   . Peripheral vascular disease [facility])     Past Surgical History:  Procedure Laterality Date  . arm surgery Right   . EXTERIORIZATION OF A CONTINUOUS AMBULATORY PERITONEAL DIALYSIS CATHETER    . PERIPHERAL VASCULAR CATHETERIZATION N/A 03/08/2016   Procedure: Dialysis/Perma Catheter Insertion;  Surgeon: Renford Dills, MD;  Location: [facility] INVASIVE CV LAB;  Service: Cardiovascular;  Laterality: N/A;  . PERIPHERAL VASCULAR CATHETERIZATION N/A 04/20/2016   Procedure: Dialysis/Perma Catheter Removal;  Surgeon: Annice Needy, MD;  Location: [facility] INVASIVE CV LAB;  Service: Cardiovascular;  Laterality: N/A;  . PERIPHERAL VASCULAR CATHETERIZATION N/A 05/25/2016   Procedure: Dialysis/Perma Catheter Insertion;  Surgeon: Annice Needy, MD;  Location: [facility] INVASIVE CV LAB;  Service: Cardiovascular;  Laterality: N/A;  . WRIST SURGERY Right     Family History  Problem Relation Age of Onset  . Hypertension Mother   . CAD Mother     Social History:  reports that he has been smoking Cigarettes.  He has been smoking about 1.00 pack per day. He uses smokeless tobacco. He reports that he does not drink alcohol or use drugs.   Review of  Systems: Constitutional:  denies fever, chills, diaphoresis, appetite change and fatigue.  HEENT: denies photophobia, eye pain, redness, hearing loss, ear pain, congestion, sore throat, rhinorrhea, sneezing, neck pain, neck stiffness and tinnitus.  Respiratory: admits to SOB, DOE, cough, chest tightness, and wheezing.  Cardiovascular: denies chest pain, palpitations and leg swelling.  Gastrointestinal: denies nausea, vomiting, abdominal pain, diarrhea, constipation, blood in stool.  Genitourinary: denies dysuria, urgency, frequency, hematuria, flank pain and difficulty urinating.  Musculoskeletal: denies  myalgias, back pain, joint swelling, arthralgias and gait problem.   Skin: denies pallor, rash and wound.  Neurological: admits to   weakness,    Hematological: denies adenopathy, easy bruising, personal or family bleeding history.  Psychiatric/ Behavioral: denies suicidal ideation, mood changes, confusion, nervousness, sleep disturbance and agitation.    Physical Exam: BP 105/76   Pulse 84   Temp 97.7 F (36.5 C) (Oral)   Resp 18   Ht 6' (1.829 m) Comment: recorded on 05/25/16  Wt (!) 313 lb 15 oz (142.4 kg)   SpO2 97%   BMI 42.58 kg/m   Wt Readings from Last 3 Encounters:  06/22/16 (!) 313 lb 15 oz (142.4 kg)  05/25/16 (!) 327 lb (148.3 kg)  05/11/16 (!) 365 lb (165.6 kg)    General: Vital signs reviewed and noted. Obese middle-aged black gentleman. He is in no  acute distress.   Head: Normocephalic, atraumatic, sclera anicteric,   Neck: Supple. Negative for carotid bruits. No JVD   Lungs:  Clear bilaterally, no  wheezes, rales, or rhonchi. Breathing is normal   Heart: RRR with S1 S2.   1- 2/6 systolic murmur., rubs, or gallops ,  dialysis catheter in his right subclavian region.   Abdomen/ GI :  Soft, non-tender, non-distended with normoactive bowel sounds. No hepatomegaly. No rebound/guarding. No obvious abdominal masses,  moderately obese   MSK: Strength and the appear  normal for age.   Extremities: No clubbing or cyanosis. No edema.  Distal pedal pulses are 2+ and equal   Neurologic:  CN are grossly intact,  No obvious motor or sensory defect.  Alert and oriented X 3. Moves all extremities spontaneously.  Psych: Responds to questions appropriately with a normal affect.     Lab results: Basic Metabolic Panel:  Recent Labs Lab 06/21/16 1205 06/21/16 1223 06/22/16 1422  NA 137 137 137  K 3.6 3.4* 3.7  CL 99* 98* 96*  CO2 25  --  26  GLUCOSE 98 99 106*  BUN 24* 25* 32*  CREATININE 6.68* 7.00* 8.06*  CALCIUM 8.9  --  8.7*  PHOS  --   --  6.2*    Liver Function Tests:  Recent Labs Lab 06/21/16 1205 06/22/16 1422  AST 19  --   ALT 14*  --   ALKPHOS 101  --   BILITOT 0.4  --   PROT 6.7  --   ALBUMIN 2.9* 2.7*   No results for input(s): LIPASE, AMYLASE in the last 168 hours. No results for input(s): AMMONIA in the last 168 hours.  CBC:  Recent Labs Lab 06/21/16 1205 06/21/16 1223 06/22/16 1422  WBC 7.3  --  7.2  NEUTROABS 4.8  --   --   HGB 9.4* 9.9* 9.4*  HCT 30.8* 29.0* 30.8*  MCV 91.9  --  91.1  PLT 246  --  251    Cardiac Enzymes: No results for input(s): CKTOTAL, CKMB, CKMBINDEX, TROPONINI in the last 168 hours.  BNP: Invalid input(s): POCBNP  CBG:  Recent Labs Lab 06/21/16 1135 06/21/16 1709 06/21/16 2122 06/22/16 0631 06/22/16 1221  GLUCAP 95 103* 87 86 82    Coagulation Studies:  Recent Labs  06/21/16 1205  LABPROT 12.9  INR 0.97     Other results:  Personal review of EKG shows :  Normal sinus rhythm. He  has inferior Q waves. There is a slight hint of ST segment elevation..    Imaging: [name] ZO Contrast  Result Date: 06/21/2016 CLINICAL DATA:  LEFT facial droop and slurred speech beginning this morning. LEFT hand weakness. Follow-up code stroke. History of end-stage on dialysis, hypertension, hyperlipidemia and diabetes. EXAM: MR HEAD WITHOUT CONTRAST MR CIRCLE OF WILLIS WITHOUT  CONTRAST MRA OF THE NECK WITHOUT CONTRAST TECHNIQUE: Multiplanar, multiecho pulse sequences of the brain, circle of Willis and surrounding structures were obtained without intravenous contrast. Angiographic images of the neck were obtained using MRA technique without intravenous contrast. COMPARISON:  CT HEAD June 21, 2016 at 1141 hours FINDINGS: MR HEAD FINDINGS- multiple sequences are mildly motion degraded. BRAIN: Subcentimeter focus of reduced diffusion RIGHT frontal and parietal lobes with low ADC values. Patchy reduced diffusion RIGHT basal ganglia and insula with normalizing ADC values in intermediate to bright FLAIR T2 hyperintense signal. No susceptibility artifact to suggest hemorrhage. The ventricles and sulci are normal for patient's age. Greater than expected scattered subcentimeter supratentorial white matter FLAIR T2 hyperintensities, exclusive of the aforementioned abnormality. No mass effect. No abnormal extra-axial fluid collections. VASCULAR: Normal major intracranial vascular flow voids present at skull base. SKULL AND UPPER CERVICAL SPINE: No abnormal sellar expansion. No suspicious calvarial bone marrow signal. Craniocervical junction maintained. SINUSES/ORBITS: Mild paranasal sinus mucosal thickening. Mastoid air cells are well aerated. The included ocular globes and orbital contents are non-suspicious. OTHER: None. MR CIRCLE OF WILLIS FINDINGS- moderately motion degraded examination . ANTERIOR CIRCULATION: Normal flow related enhancement of the included cervical, petrous, cavernous and supraclinoid internal carotid arteries. Patent anterior communicating artery. Flow related enhancement of the anterior and middle cerebral arteries, including distal segments. Moderate stenosis distal RIGHT M1 and severe RIGHT M2 origins. Moderate stenosis proximal LEFT M2 origin, superior division. No large vessel occlusion, abnormal luminal irregularity, aneurysm. POSTERIOR CIRCULATION: LEFT vertebral  artery is dominant. Basilar artery is patent, with normal flow related enhancement of the main branch vessels. Mild luminal irregularity bilateral vertebral arteries compatible with atherosclerosis. Normal flow related enhancement of the posterior cerebral arteries. No large vessel occlusion, high-grade stenosis, abnormal luminal irregularity, aneurysm. ANATOMIC VARIANTS: None. MRA NECK FINDINGS- limited noncontrast, moderately motion degraded examination. ANTERIOR CIRCULATION: The common carotid arteries are widely patent bilaterally origins not imaged due to time-of-flight technique. The carotid bifurcation is patent bilaterally and there is no hemodynamically significant carotid stenosis by NASCET criteria. No evidence for atherosclerosis or flow limiting stenosis of the cervical internal carotid arteries. POSTERIOR CIRCULATION: Bilateral vertebral arteries are patent to the vertebrobasilar junction, origins not imaged. No evidence for atherosclerosis or flow limiting stenosis. Source images and MIP image were reviewed. IMPRESSION: MRI HEAD: Mildly motion degraded examination. Multifocal acute on subacute RIGHT MCA territory infarcts including RIGHT basal ganglia. Mild to moderate white matter changes compatible chronic small vessel ischemic disease. MRA HEAD: Moderately motion degraded examination. No emergent large vessel occlusion. Severe stenosis RIGHT M2 origin. Moderate stenosis RIGHT M1 and LEFT M2 origin. MRA NECK: Negative noncontrast moderately motion degraded examination. Electronically Signed   By: Awilda Metro M.D.   On: 06/21/2016 21:18   Mr Maxine Glenn Neck Wo Contrast  Result Date: 06/21/2016 CLINICAL DATA:  LEFT facial droop and slurred speech beginning this morning. LEFT hand weakness. Follow-up code stroke. History of end-stage on dialysis, hypertension, hyperlipidemia and diabetes. EXAM: MR HEAD WITHOUT CONTRAST MR CIRCLE OF WILLIS WITHOUT CONTRAST MRA OF THE NECK WITHOUT CONTRAST TECHNIQUE:  Multiplanar, multiecho pulse sequences of the brain, circle  of Willis and surrounding structures were obtained without intravenous contrast. Angiographic images of the neck were obtained using MRA technique without intravenous contrast. COMPARISON:  CT HEAD June 21, 2016 at 1141 hours FINDINGS: MR HEAD FINDINGS- multiple sequences are mildly motion degraded. BRAIN: Subcentimeter focus of reduced diffusion RIGHT frontal and parietal lobes with low ADC values. Patchy reduced diffusion RIGHT basal ganglia and insula with normalizing ADC values in intermediate to bright FLAIR T2 hyperintense signal. No susceptibility artifact to suggest hemorrhage. The ventricles and sulci are normal for patient's age. Greater than expected scattered subcentimeter supratentorial white matter FLAIR T2 hyperintensities, exclusive of the aforementioned abnormality. No mass effect. No abnormal extra-axial fluid collections. VASCULAR: Normal major intracranial vascular flow voids present at skull base. SKULL AND UPPER CERVICAL SPINE: No abnormal sellar expansion. No suspicious calvarial bone marrow signal. Craniocervical junction maintained. SINUSES/ORBITS: Mild paranasal sinus mucosal thickening. Mastoid air cells are well aerated. The included ocular globes and orbital contents are non-suspicious. OTHER: None. MR CIRCLE OF WILLIS FINDINGS- moderately motion degraded examination . ANTERIOR CIRCULATION: Normal flow related enhancement of the included cervical, petrous, cavernous and supraclinoid internal carotid arteries. Patent anterior communicating artery. Flow related enhancement of the anterior and middle cerebral arteries, including distal segments. Moderate stenosis distal RIGHT M1 and severe RIGHT M2 origins. Moderate stenosis proximal LEFT M2 origin, superior division. No large vessel occlusion, abnormal luminal irregularity, aneurysm. POSTERIOR CIRCULATION: LEFT vertebral artery is dominant. Basilar artery is patent, with normal  flow related enhancement of the main branch vessels. Mild luminal irregularity bilateral vertebral arteries compatible with atherosclerosis. Normal flow related enhancement of the posterior cerebral arteries. No large vessel occlusion, high-grade stenosis, abnormal luminal irregularity, aneurysm. ANATOMIC VARIANTS: None. MRA NECK FINDINGS- limited noncontrast, moderately motion degraded examination. ANTERIOR CIRCULATION: The common carotid arteries are widely patent bilaterally origins not imaged due to time-of-flight technique. The carotid bifurcation is patent bilaterally and there is no hemodynamically significant carotid stenosis by NASCET criteria. No evidence for atherosclerosis or flow limiting stenosis of the cervical internal carotid arteries. POSTERIOR CIRCULATION: Bilateral vertebral arteries are patent to the vertebrobasilar junction, origins not imaged. No evidence for atherosclerosis or flow limiting stenosis. Source images and MIP image were reviewed. IMPRESSION: MRI HEAD: Mildly motion degraded examination. Multifocal acute on subacute RIGHT MCA territory infarcts including RIGHT basal ganglia. Mild to moderate white matter changes compatible chronic small vessel ischemic disease. MRA HEAD: Moderately motion degraded examination. No emergent large vessel occlusion. Severe stenosis RIGHT M2 origin. Moderate stenosis RIGHT M1 and LEFT M2 origin. MRA NECK: Negative noncontrast moderately motion degraded examination. Electronically Signed   By: Awilda Metro M.D.   On: 06/21/2016 21:18   Mr Brain Wo Contrast  Result Date: 06/21/2016 CLINICAL DATA:  LEFT facial droop and slurred speech beginning this morning. LEFT hand weakness. Follow-up code stroke. History of end-stage on dialysis, hypertension, hyperlipidemia and diabetes. EXAM: MR HEAD WITHOUT CONTRAST MR CIRCLE OF WILLIS WITHOUT CONTRAST MRA OF THE NECK WITHOUT CONTRAST TECHNIQUE: Multiplanar, multiecho pulse sequences of the brain, circle of  Willis and surrounding structures were obtained without intravenous contrast. Angiographic images of the neck were obtained using MRA technique without intravenous contrast. COMPARISON:  CT HEAD June 21, 2016 at 1141 hours FINDINGS: MR HEAD FINDINGS- multiple sequences are mildly motion degraded. BRAIN: Subcentimeter focus of reduced diffusion RIGHT frontal and parietal lobes with low ADC values. Patchy reduced diffusion RIGHT basal ganglia and insula with normalizing ADC values in intermediate to bright FLAIR T2 hyperintense signal. No susceptibility artifact  to suggest hemorrhage. The ventricles and sulci are normal for patient's age. Greater than expected scattered subcentimeter supratentorial white matter FLAIR T2 hyperintensities, exclusive of the aforementioned abnormality. No mass effect. No abnormal extra-axial fluid collections. VASCULAR: Normal major intracranial vascular flow voids present at skull base. SKULL AND UPPER CERVICAL SPINE: No abnormal sellar expansion. No suspicious calvarial bone marrow signal. Craniocervical junction maintained. SINUSES/ORBITS: Mild paranasal sinus mucosal thickening. Mastoid air cells are well aerated. The included ocular globes and orbital contents are non-suspicious. OTHER: None. MR CIRCLE OF WILLIS FINDINGS- moderately motion degraded examination . ANTERIOR CIRCULATION: Normal flow related enhancement of the included cervical, petrous, cavernous and supraclinoid internal carotid arteries. Patent anterior communicating artery. Flow related enhancement of the anterior and middle cerebral arteries, including distal segments. Moderate stenosis distal RIGHT M1 and severe RIGHT M2 origins. Moderate stenosis proximal LEFT M2 origin, superior division. No large vessel occlusion, abnormal luminal irregularity, aneurysm. POSTERIOR CIRCULATION: LEFT vertebral artery is dominant. Basilar artery is patent, with normal flow related enhancement of the main branch vessels. Mild  luminal irregularity bilateral vertebral arteries compatible with atherosclerosis. Normal flow related enhancement of the posterior cerebral arteries. No large vessel occlusion, high-grade stenosis, abnormal luminal irregularity, aneurysm. ANATOMIC VARIANTS: None. MRA NECK FINDINGS- limited noncontrast, moderately motion degraded examination. ANTERIOR CIRCULATION: The common carotid arteries are widely patent bilaterally origins not imaged due to time-of-flight technique. The carotid bifurcation is patent bilaterally and there is no hemodynamically significant carotid stenosis by NASCET criteria. No evidence for atherosclerosis or flow limiting stenosis of the cervical internal carotid arteries. POSTERIOR CIRCULATION: Bilateral vertebral arteries are patent to the vertebrobasilar junction, origins not imaged. No evidence for atherosclerosis or flow limiting stenosis. Source images and MIP image were reviewed. IMPRESSION: MRI HEAD: Mildly motion degraded examination. Multifocal acute on subacute RIGHT MCA territory infarcts including RIGHT basal ganglia. Mild to moderate white matter changes compatible chronic small vessel ischemic disease. MRA HEAD: Moderately motion degraded examination. No emergent large vessel occlusion. Severe stenosis RIGHT M2 origin. Moderate stenosis RIGHT M1 and LEFT M2 origin. MRA NECK: Negative noncontrast moderately motion degraded examination. Electronically Signed   By: Awilda Metro M.D.   On: 06/21/2016 21:18   Ct Head Code Stroke W/o Cm  Result Date: 06/21/2016 CLINICAL DATA:  Code stroke. Code stroke. Left-sided weakness and slurred speech. Facial droop. EXAM: CT HEAD WITHOUT CONTRAST TECHNIQUE: Contiguous axial images were obtained from the base of the skull through the vertex without intravenous contrast. COMPARISON:  None. FINDINGS: Brain: There is subtle loss of gray-white differentiation along the posterior right insula and in the right lentiform nucleus. Fall this could  be artifact, early ischemia is certainly considered. Acute hemorrhage or mass lesion is present. Cortex over the convexities is within normal limits. The ventricles are of normal size. No significant extra-axial fluid collection is present. Vascular: Atherosclerotic calcifications are present within the cavernous internal carotid arteries. There is no hyperdense vessel. A calcification is evident near the right MCA bifurcation. There is no hyperdense vessel. Skull: The calvarium is within normal limits. Sinuses/Orbits: Chronic bilateral maxillary sinus disease is present. There is soft tissue opacification of the ostiomeatal complex bilaterally. This may represent polyp point disease. The globes and orbits are within normal limits. ASPECTS [facility] Stroke Program Early CT Score) - Ganglionic level infarction (caudate, lentiform nuclei, internal capsule, insula, M1-M3 cortex): 5/7. Points lost for the posterior right insula and lentiform nucleus. - Supraganglionic infarction (M4-M6 cortex): 3/3 Total score (0-10 with 10 being normal): 8/10 IMPRESSION: 1.  Subtle loss of gray-white differentiation in the posterior right insula and lentiform nucleus suggesting acute ischemia. 2. Chronic bilateral maxillary sinus disease 3. ASPECTS is 8/10 Electronically Signed   By: Marin Roberts M.D.   On: 06/21/2016 11:58        Assessment & Plan:  1. Chronic combined systolic and diastolic congestive heart failure: The patient was incidentally noted have congestive heart failure on echocardiogram. He really does not have any symptoms of CHF but does have some mild shortness breath with exertion. He denies orthopnea and PND. He denies any chest pain.  He does not miss dialysis. He states that his blood pressure has been fairly well-controlled.  We will need to start him on standard heart failure medications including beta blocker and ACE inhibitor or ARB ( as tolerated - BP is low today )     He'll need a cardiac  catheterization for further evaluation. I scheduled him for cardiac cath tomorrow. We have discussed the risks, benefits, and options of cardiac catheter station. He understands and agrees to proceed. His EKG is suspicious for previous inferior wall myocardial infarction. He has absolutely no symptoms of myocardial infarction and his and has never had symptoms that are suggestive of coronary ischemia.  I've advised him to quit smoking. I've advised him to avoid salt and avoid fried foods.    Vesta Mixer, Montez Hageman., MD, [facility] 06/22/2016, 5:58 PM Office - [phone] Pager [phone]

## 2016-06-22 NOTE — Discharge Summary (Signed)
Name: Justin Fitzgerald MRN: [id number] DOB: [birth year] 56 y.o. PCP: Dorothey Baseman, MD  Date of Admission: 06/21/2016 11:31 AM Date of Discharge: 06/26/2016 Attending Physician: Doneen Poisson, MD  Discharge Diagnosis: 1. Right MCA territory infarcts 2. Combined systolic and diastolic heart failure 3. ESRD on HD 4. HTN Principal Problem:   CVA (cerebral vascular accident) [facility]) Active Problems:   Essential hypertension   Type 2 diabetes mellitus (HCC)   End stage renal disease (HCC)   Acute ischemic stroke (HCC)   Peripheral vascular disease (HCC)   Chronic combined systolic and diastolic congestive heart failure [facility])   Discharge Medications: Allergies as of 06/26/2016      Reactions   Ramipril Shortness Of Breath   Aspirin    kidney disease      Medication List    TAKE these medications   aspirin 81 MG EC tablet Take 1 tablet (81 mg total) by mouth daily. Start taking on:  06/27/2016   atorvastatin 80 MG tablet Commonly known as:  LIPITOR Take 1 tablet (80 mg total) by mouth daily at 6 PM.   beclomethasone 40 MCG/ACT inhaler Commonly known as:  QVAR Inhale 1 puff into the lungs 2 (two) times daily.   carvedilol 3.125 MG tablet Commonly known as:  COREG Take 1 tablet (3.125 mg total) by mouth 2 (two) times daily with a meal.   cinacalcet 30 MG tablet Commonly known as:  SENSIPAR Take 4 tablets (120 mg total) by mouth daily with supper.   clopidogrel 75 MG tablet Commonly known as:  PLAVIX Take 75 mg by mouth daily.   furosemide 80 MG tablet Commonly known as:  LASIX Take 160 mg by mouth 2 (two) times daily.   gabapentin 300 MG capsule Commonly known as:  NEURONTIN Take 300 mg by mouth at bedtime.   sevelamer carbonate 2.4 g Pack Commonly known as:  RENVELA Take 2.4 g by mouth 3 (three) times daily with meals.   SPIRIVA HANDIHALER 18 MCG inhalation capsule Generic drug:  tiotropium Place 18 mcg into inhaler and inhale daily.        Disposition and follow-up:   Justin Fitzgerald was discharged from [facility] in Good condition.  At the hospital follow up visit please address:  1.  The patient's transesophageal echocardiogram showed an abnormality that would benefit from dedicated imaging. Please see below for further details. Also, please ensure the patient is taking all of his medications as they are prescribed.  2.  Labs / imaging needed at time of follow-up: None  3.  Pending labs/ test needing follow-up: None  Follow-up Appointments: Follow-up Information    Eula Listen, PA-C. Go on 07/10/2016.   Specialties:  Physician Assistant, Cardiology, Radiology Why:  @2pm  for post hospital  Contact information: [address] [place] [address] [phone]           Hospital Course by problem list: Principal Problem:   CVA (cerebral vascular accident) [facility]) Active Problems:   Essential hypertension   Type 2 diabetes mellitus (HCC)   End stage renal disease (HCC)   Acute ischemic stroke (HCC)   Peripheral vascular disease (HCC)   Chronic combined systolic and diastolic congestive heart failure (HCC)   1. Right MCA territory infarcts The patient presented to the [facility] emergency department on 06/22/2016 with a history of left-sided facial droop and slurred speech. He woke up with these symptoms and his wife notified him that his face was drooping.  He called EMS was brought to the hospital. Once in the emergency department he was deemed to be out of the window for TPA or other intervention. Stat head CT without contrast showed no acute intracranial abnormality. He was then admitted to the inpatient service for further workup of potential stroke. MRI during stroke workup showed multifocal acute on subacute right MCA territory infarcts. MRA head showed severe stenosis of the right M2 and moderate stenosis of the right M1 and left M2 segment. MRA neck was negative.  Echocardiogram showed new heart failure as detailed below. Lower extremity Dopplers were negative for clot. Neurology had a concern for potential cardioembolic source and he was kept inpatient until a transesophageal echocardiogram was to be performed. During this time he was treated with dual antiplatelet therapy with aspirin and clopidogrel. Fortunately, the patient has no residual deficits from his stroke. He no longer has facial droop. He is asymptomatic. Results of the transesophageal echocardiogram showed no evidence of thrombus in the atrial cavity or appendage. No evidence of thrombus in the atrial cavity or appendage right. No PFO by Doppler. There was an incidental finding of a walled off region next to the aorta between the left and noncoronary cusps of the aortic valve that may represent remote abscess. Cardiology has recommended a cardiac CT as an elective follow-up procedure to further evaluate this finding. The patient will be discharged on aspirin and clopidogrel daily as well as high intensity statin therapy.  2. Combined systolic and diastolic congestive heart failure During the patient's stroke workup an echocardiogram was obtained which demonstrated left ventricular ejection fraction of 20-25% along with grade 1 diastolic dysfunction. 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 Normal right heart hemodynamics and normal left ventricular end-diastolic pressure. The patient was not a candidate for PCI. It was thought the most likely etiology of the patient's combined heart failure was nonischemic in etiology. He will benefit from maximal medical management and was started on low-dose carvedilol 3.125 mg twice a day secondary to concerns for hypotension. An ACE inhibitor or an ARB was not started as the patient has a known allergy to ramipril. He will need  ongoing follow-up with cardiology in the outpatient setting. Also. He may benefit from a trial of an angiotensin receptor blocker as his allergy is listed to ramapril.Results of the transesophageal echocardiogram showed no evidence of thrombus in the atrial cavity or appendage. TEE showed no evidence of thrombus in the atrial cavity or appendage right. No PFO by Doppler. There was an incidental finding of a walled off region next to the aorta between the left and noncoronary cusps of the aortic valve that may represent remote abscess. Cardiology has recommended a cardiac CT as an elective follow-up procedure to further evaluate this finding.   3. End-stage renal disease on hemodialysis The patient has end-stage renal disease requiring dialysis. Previously he was on peritoneal dialysis but this was recently switched to hemodialysis. While in the hospital the patient continued with his scheduled hemodialysis. No changes were made.  4. Hypertension, Labile with Hypotension  The patient has a history of hypertension. However, during his hospitalization he was actually noted to be hypotensive. His blood pressure is mainly controlled through his volume status and hemodialysis. Given the patient has new onset combined systolic and diastolic congestive heart failure there is a mortality benefit for starting a beta blocker. He was  started on low-dose carvedilol 3.125 mg twice a day. It did appear that his blood pressure was tolerating this agent. He will need close oversight of his blood pressure in the outpatient setting to allow for titration of his beta blocker or the addition of an angiotensin receptor blocker.  Discharge Vitals:   BP 101/62   Pulse 81   Temp 97.8 F (36.6 C) (Oral)   Resp 20   Ht 6' (1.829 m)   Wt (!) 306 lb 6.4 oz (139 kg)   SpO2 99%   BMI 41.56 kg/m   Pertinent Labs, Studies, and Procedures:  1. CT head without contrast-no acute intracranial normality 2. MRI brain-multifocal  right MCA territory infarcts 3. MRA head-severe stenosis right M2 origin, moderate stenosis right M1 and left M2 origin 3. MRA neck negative 4. Transthoracic echocardiogram-left ventricular ejection fraction of 20-25%, grade 1 diastolic dysfunction, global hypokinesis 5. Lower extremity Dopplers-no evidence of deep vein or superficial thrombosis 6. 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 Not a candidate for PCI. 7. Transesophageal echocardiogram-no evidence of thrombus in the left or right atrial cavity or left atrial appendage. No PFO. Incidental finding of what appears to be a remote abscess. Cardiology would recommend cardiac CT has an elective outpatient procedure for further evaluation.  Discharge Instructions: Discharge Instructions    Diet - low sodium heart healthy    Complete by:  As directed    Discharge instructions    Complete by:  As directed    As you know you have had a stroke.  We have started new medications to treat this. I want you to take one aspirin every day going forward. Also I have started a medicine called Plavix. You're to take this once a day as well. Again, to recap you're to take aspirin and Plavix once a day every day.  We also noted that your heart was not pumping like normal. We have started a medication for this as well called Coreg. You're to take this medicine twice a day once in the morning and once in the evening.  Also, you will need to make a follow-up appointment to see your primary care provider as soon as possible. Additionally, please ensure you make an appointment to see the cardiologist and the neurologist that saw you in the hospital.  Again, we have started several new medications. Please take these new medications as stated above and as they are prescribed. Please continue your other  medications as they were written before you came to the hospital.   Increase activity slowly    Complete by:  As directed       Signed: Thomasene Lot, MD 06/26/2016, 3:22 PM   Pager: [phone]

## 2016-06-22 NOTE — Procedures (Signed)
I have personally reviewed this patient's dialysis session.   Prev on PD, now on TTS HD (under care of [facility] [place]) [place] [facility] (05/15/16 Dr. Wyn Quaker) 2K bath 400/800 3L goal BP soft - UF as tolerated  Camille Bal, MD [facility] [phone] Pager 06/22/2016, 2:59 PM

## 2016-06-22 NOTE — H&P (Signed)
Internal Medicine Attending Admission Note Date: 06/22/2016  Patient name: Justin Fitzgerald Medical record number: [id number] Date of birth: 01-12-1961 Age: 56 y.o. Gender: male  I saw and evaluated the patient. I reviewed the resident's note and I agree with the resident's findings and plan as documented in the resident's note.  Chief Complaint(s): Left facial droop  History - key components related to admission:  Justin Fitzgerald is a 56 year old man with a history of end-stage renal disease on dialysis, diabetes, and peripheral vascular disease who presents with the acute onset of left facial droop. He was in his usual state of health when he went to bed the night prior to the morning of admission. When he awoke he was feeling fine but dropped a coffee cup. His wife came to check on him and noted a left facial droop. She therefore called EMS and he was transported to the emergency department. In the emergency department he was noted to have a left facial droop and MRI revealed multifocal acute on subacute right MCA territory infarcts including the right basal ganglia and associated severe stenosis of the right M2 origin. He was therefore admitted to the internal medicine teaching service for further evaluation and care. Of note, he denied any headache or visual changes. There was transient left hand weakness which resolved soon after arrival to the emergency department. He has been compliant with his Plavix for his peripheral vascular disease, but has not been on aspirin because of concerns with his underlying chronic kidney disease for which he still makes urine despite being on hemodialysis.  When seen on rounds the morning after admission his facial droop had completely resolved. He was without any other complaints.  Physical Exam - key components related to admission:  Vitals:   06/22/16 1400 06/22/16 1430 06/22/16 1500 06/22/16 1600  BP: 106/70 104/69 105/76   Pulse: 83 83 84   Resp: (!)  21 18 18    Temp:      TempSrc:      SpO2:      Weight:      Height:    6' (1.829 m)   Gen.: Well-developed, well-nourished, morbidly obese man lying comfortably in bed in no acute distress. Neuro: Alert and oriented 3 without dysarthria. Cranial nerves intact without facial droop.  Lab results:  Basic Metabolic Panel:  Recent Labs  40/98/11 1205 06/21/16 1223 06/22/16 1422  NA 137 137 137  K 3.6 3.4* 3.7  CL 99* 98* 96*  CO2 25  --  26  GLUCOSE 98 99 106*  BUN 24* 25* 32*  CREATININE 6.68* 7.00* 8.06*  CALCIUM 8.9  --  8.7*  PHOS  --   --  6.2*   Liver Function Tests:  Recent Labs  06/21/16 1205 06/22/16 1422  AST 19  --   ALT 14*  --   ALKPHOS 101  --   BILITOT 0.4  --   PROT 6.7  --   ALBUMIN 2.9* 2.7*   CBC:  Recent Labs  06/21/16 1205 06/21/16 1223 06/22/16 1422  WBC 7.3  --  7.2  NEUTROABS 4.8  --   --   HGB 9.4* 9.9* 9.4*  HCT 30.8* 29.0* 30.8*  MCV 91.9  --  91.1  PLT 246  --  251   CBG:  Recent Labs  06/21/16 1135 06/21/16 1709 06/21/16 2122 06/22/16 0631 06/22/16 1221  GLUCAP 95 103* 87 86 82   Fasting Lipid Panel:  Recent Labs  06/22/16 0235  CHOL  170  HDL 35*  LDLCALC 98  TRIG 161*  CHOLHDL 4.9   Coagulation:  Recent Labs  06/21/16 1205  INR 0.97   Urine Drug Screen:  Negative for opiates, cocaine, benzodiazepines, amphetamines, THC, and barbiturates.  Alcohol Level:  Recent Labs  06/21/16 1205  ETH <5   Urinalysis:  Clear, yellow, specific gravity 1.008, pH 7.0, glucose 50, hemoglobin small, protein 100, no red blood cells, 0-5 white blood cells per high-power field.  Imaging results:  [name] WR Contrast  Result Date: 06/21/2016 CLINICAL DATA:  LEFT facial droop and slurred speech beginning this morning. LEFT hand weakness. Follow-up code stroke. History of end-stage on dialysis, hypertension, hyperlipidemia and diabetes. EXAM: MR HEAD WITHOUT CONTRAST MR CIRCLE OF WILLIS WITHOUT CONTRAST MRA OF  THE NECK WITHOUT CONTRAST TECHNIQUE: Multiplanar, multiecho pulse sequences of the brain, circle of Willis and surrounding structures were obtained without intravenous contrast. Angiographic images of the neck were obtained using MRA technique without intravenous contrast. COMPARISON:  CT HEAD June 21, 2016 at 1141 hours FINDINGS: MR HEAD FINDINGS- multiple sequences are mildly motion degraded. BRAIN: Subcentimeter focus of reduced diffusion RIGHT frontal and parietal lobes with low ADC values. Patchy reduced diffusion RIGHT basal ganglia and insula with normalizing ADC values in intermediate to bright FLAIR T2 hyperintense signal. No susceptibility artifact to suggest hemorrhage. The ventricles and sulci are normal for patient's age. Greater than expected scattered subcentimeter supratentorial white matter FLAIR T2 hyperintensities, exclusive of the aforementioned abnormality. No mass effect. No abnormal extra-axial fluid collections. VASCULAR: Normal major intracranial vascular flow voids present at skull base. SKULL AND UPPER CERVICAL SPINE: No abnormal sellar expansion. No suspicious calvarial bone marrow signal. Craniocervical junction maintained. SINUSES/ORBITS: Mild paranasal sinus mucosal thickening. Mastoid air cells are well aerated. The included ocular globes and orbital contents are non-suspicious. OTHER: None. MR CIRCLE OF WILLIS FINDINGS- moderately motion degraded examination . ANTERIOR CIRCULATION: Normal flow related enhancement of the included cervical, petrous, cavernous and supraclinoid internal carotid arteries. Patent anterior communicating artery. Flow related enhancement of the anterior and middle cerebral arteries, including distal segments. Moderate stenosis distal RIGHT M1 and severe RIGHT M2 origins. Moderate stenosis proximal LEFT M2 origin, superior division. No large vessel occlusion, abnormal luminal irregularity, aneurysm. POSTERIOR CIRCULATION: LEFT vertebral artery is dominant.  Basilar artery is patent, with normal flow related enhancement of the main branch vessels. Mild luminal irregularity bilateral vertebral arteries compatible with atherosclerosis. Normal flow related enhancement of the posterior cerebral arteries. No large vessel occlusion, high-grade stenosis, abnormal luminal irregularity, aneurysm. ANATOMIC VARIANTS: None. MRA NECK FINDINGS- limited noncontrast, moderately motion degraded examination. ANTERIOR CIRCULATION: The common carotid arteries are widely patent bilaterally origins not imaged due to time-of-flight technique. The carotid bifurcation is patent bilaterally and there is no hemodynamically significant carotid stenosis by NASCET criteria. No evidence for atherosclerosis or flow limiting stenosis of the cervical internal carotid arteries. POSTERIOR CIRCULATION: Bilateral vertebral arteries are patent to the vertebrobasilar junction, origins not imaged. No evidence for atherosclerosis or flow limiting stenosis. Source images and MIP image were reviewed. IMPRESSION: MRI HEAD: Mildly motion degraded examination. Multifocal acute on subacute RIGHT MCA territory infarcts including RIGHT basal ganglia. Mild to moderate white matter changes compatible chronic small vessel ischemic disease. MRA HEAD: Moderately motion degraded examination. No emergent large vessel occlusion. Severe stenosis RIGHT M2 origin. Moderate stenosis RIGHT M1 and LEFT M2 origin. MRA NECK: Negative noncontrast moderately motion degraded examination. Electronically Signed   By: Awilda Metro M.D.   On:  06/21/2016 21:18   Mr Maxine Glenn Neck Wo Contrast  Result Date: 06/21/2016 CLINICAL DATA:  LEFT facial droop and slurred speech beginning this morning. LEFT hand weakness. Follow-up code stroke. History of end-stage on dialysis, hypertension, hyperlipidemia and diabetes. EXAM: MR HEAD WITHOUT CONTRAST MR CIRCLE OF WILLIS WITHOUT CONTRAST MRA OF THE NECK WITHOUT CONTRAST TECHNIQUE: Multiplanar,  multiecho pulse sequences of the brain, circle of Willis and surrounding structures were obtained without intravenous contrast. Angiographic images of the neck were obtained using MRA technique without intravenous contrast. COMPARISON:  CT HEAD June 21, 2016 at 1141 hours FINDINGS: MR HEAD FINDINGS- multiple sequences are mildly motion degraded. BRAIN: Subcentimeter focus of reduced diffusion RIGHT frontal and parietal lobes with low ADC values. Patchy reduced diffusion RIGHT basal ganglia and insula with normalizing ADC values in intermediate to bright FLAIR T2 hyperintense signal. No susceptibility artifact to suggest hemorrhage. The ventricles and sulci are normal for patient's age. Greater than expected scattered subcentimeter supratentorial white matter FLAIR T2 hyperintensities, exclusive of the aforementioned abnormality. No mass effect. No abnormal extra-axial fluid collections. VASCULAR: Normal major intracranial vascular flow voids present at skull base. SKULL AND UPPER CERVICAL SPINE: No abnormal sellar expansion. No suspicious calvarial bone marrow signal. Craniocervical junction maintained. SINUSES/ORBITS: Mild paranasal sinus mucosal thickening. Mastoid air cells are well aerated. The included ocular globes and orbital contents are non-suspicious. OTHER: None. MR CIRCLE OF WILLIS FINDINGS- moderately motion degraded examination . ANTERIOR CIRCULATION: Normal flow related enhancement of the included cervical, petrous, cavernous and supraclinoid internal carotid arteries. Patent anterior communicating artery. Flow related enhancement of the anterior and middle cerebral arteries, including distal segments. Moderate stenosis distal RIGHT M1 and severe RIGHT M2 origins. Moderate stenosis proximal LEFT M2 origin, superior division. No large vessel occlusion, abnormal luminal irregularity, aneurysm. POSTERIOR CIRCULATION: LEFT vertebral artery is dominant. Basilar artery is patent, with normal flow related  enhancement of the main branch vessels. Mild luminal irregularity bilateral vertebral arteries compatible with atherosclerosis. Normal flow related enhancement of the posterior cerebral arteries. No large vessel occlusion, high-grade stenosis, abnormal luminal irregularity, aneurysm. ANATOMIC VARIANTS: None. MRA NECK FINDINGS- limited noncontrast, moderately motion degraded examination. ANTERIOR CIRCULATION: The common carotid arteries are widely patent bilaterally origins not imaged due to time-of-flight technique. The carotid bifurcation is patent bilaterally and there is no hemodynamically significant carotid stenosis by NASCET criteria. No evidence for atherosclerosis or flow limiting stenosis of the cervical internal carotid arteries. POSTERIOR CIRCULATION: Bilateral vertebral arteries are patent to the vertebrobasilar junction, origins not imaged. No evidence for atherosclerosis or flow limiting stenosis. Source images and MIP image were reviewed. IMPRESSION: MRI HEAD: Mildly motion degraded examination. Multifocal acute on subacute RIGHT MCA territory infarcts including RIGHT basal ganglia. Mild to moderate white matter changes compatible chronic small vessel ischemic disease. MRA HEAD: Moderately motion degraded examination. No emergent large vessel occlusion. Severe stenosis RIGHT M2 origin. Moderate stenosis RIGHT M1 and LEFT M2 origin. MRA NECK: Negative noncontrast moderately motion degraded examination. Electronically Signed   By: Awilda Metro M.D.   On: 06/21/2016 21:18   Mr Brain Wo Contrast  Result Date: 06/21/2016 CLINICAL DATA:  LEFT facial droop and slurred speech beginning this morning. LEFT hand weakness. Follow-up code stroke. History of end-stage on dialysis, hypertension, hyperlipidemia and diabetes. EXAM: MR HEAD WITHOUT CONTRAST MR CIRCLE OF WILLIS WITHOUT CONTRAST MRA OF THE NECK WITHOUT CONTRAST TECHNIQUE: Multiplanar, multiecho pulse sequences of the brain, circle of Willis and  surrounding structures were obtained without intravenous contrast. Angiographic images of the  neck were obtained using MRA technique without intravenous contrast. COMPARISON:  CT HEAD June 21, 2016 at 1141 hours FINDINGS: MR HEAD FINDINGS- multiple sequences are mildly motion degraded. BRAIN: Subcentimeter focus of reduced diffusion RIGHT frontal and parietal lobes with low ADC values. Patchy reduced diffusion RIGHT basal ganglia and insula with normalizing ADC values in intermediate to bright FLAIR T2 hyperintense signal. No susceptibility artifact to suggest hemorrhage. The ventricles and sulci are normal for patient's age. Greater than expected scattered subcentimeter supratentorial white matter FLAIR T2 hyperintensities, exclusive of the aforementioned abnormality. No mass effect. No abnormal extra-axial fluid collections. VASCULAR: Normal major intracranial vascular flow voids present at skull base. SKULL AND UPPER CERVICAL SPINE: No abnormal sellar expansion. No suspicious calvarial bone marrow signal. Craniocervical junction maintained. SINUSES/ORBITS: Mild paranasal sinus mucosal thickening. Mastoid air cells are well aerated. The included ocular globes and orbital contents are non-suspicious. OTHER: None. MR CIRCLE OF WILLIS FINDINGS- moderately motion degraded examination . ANTERIOR CIRCULATION: Normal flow related enhancement of the included cervical, petrous, cavernous and supraclinoid internal carotid arteries. Patent anterior communicating artery. Flow related enhancement of the anterior and middle cerebral arteries, including distal segments. Moderate stenosis distal RIGHT M1 and severe RIGHT M2 origins. Moderate stenosis proximal LEFT M2 origin, superior division. No large vessel occlusion, abnormal luminal irregularity, aneurysm. POSTERIOR CIRCULATION: LEFT vertebral artery is dominant. Basilar artery is patent, with normal flow related enhancement of the main branch vessels. Mild luminal  irregularity bilateral vertebral arteries compatible with atherosclerosis. Normal flow related enhancement of the posterior cerebral arteries. No large vessel occlusion, high-grade stenosis, abnormal luminal irregularity, aneurysm. ANATOMIC VARIANTS: None. MRA NECK FINDINGS- limited noncontrast, moderately motion degraded examination. ANTERIOR CIRCULATION: The common carotid arteries are widely patent bilaterally origins not imaged due to time-of-flight technique. The carotid bifurcation is patent bilaterally and there is no hemodynamically significant carotid stenosis by NASCET criteria. No evidence for atherosclerosis or flow limiting stenosis of the cervical internal carotid arteries. POSTERIOR CIRCULATION: Bilateral vertebral arteries are patent to the vertebrobasilar junction, origins not imaged. No evidence for atherosclerosis or flow limiting stenosis. Source images and MIP image were reviewed. IMPRESSION: MRI HEAD: Mildly motion degraded examination. Multifocal acute on subacute RIGHT MCA territory infarcts including RIGHT basal ganglia. Mild to moderate white matter changes compatible chronic small vessel ischemic disease. MRA HEAD: Moderately motion degraded examination. No emergent large vessel occlusion. Severe stenosis RIGHT M2 origin. Moderate stenosis RIGHT M1 and LEFT M2 origin. MRA NECK: Negative noncontrast moderately motion degraded examination. Electronically Signed   By: Awilda Metro M.D.   On: 06/21/2016 21:18   Ct Head Code Stroke W/o Cm  Result Date: 06/21/2016 CLINICAL DATA:  Code stroke. Code stroke. Left-sided weakness and slurred speech. Facial droop. EXAM: CT HEAD WITHOUT CONTRAST TECHNIQUE: Contiguous axial images were obtained from the base of the skull through the vertex without intravenous contrast. COMPARISON:  None. FINDINGS: Brain: There is subtle loss of gray-white differentiation along the posterior right insula and in the right lentiform nucleus. Fall this could be  artifact, early ischemia is certainly considered. Acute hemorrhage or mass lesion is present. Cortex over the convexities is within normal limits. The ventricles are of normal size. No significant extra-axial fluid collection is present. Vascular: Atherosclerotic calcifications are present within the cavernous internal carotid arteries. There is no hyperdense vessel. A calcification is evident near the right MCA bifurcation. There is no hyperdense vessel. Skull: The calvarium is within normal limits. Sinuses/Orbits: Chronic bilateral maxillary sinus disease is present. There is soft  tissue opacification of the ostiomeatal complex bilaterally. This may represent polyp point disease. The globes and orbits are within normal limits. ASPECTS [facility] Stroke Program Early CT Score) - Ganglionic level infarction (caudate, lentiform nuclei, internal capsule, insula, M1-M3 cortex): 5/7. Points lost for the posterior right insula and lentiform nucleus. - Supraganglionic infarction (M4-M6 cortex): 3/3 Total score (0-10 with 10 being normal): 8/10 IMPRESSION: 1. Subtle loss of gray-white differentiation in the posterior right insula and lentiform nucleus suggesting acute ischemia. 2. Chronic bilateral maxillary sinus disease 3. ASPECTS is 8/10 Electronically Signed   By: Marin Roberts M.D.   On: 06/21/2016 11:58   Other results:  EKG: Normal sinus rhythm at 88 bpm, normal axis, first-degree AV block, inferior Q waves, no LVH by voltage, early R wave progression, no ST changes, lateral T wave flattening in the precordial leads and T-wave inversion in a strain pattern in the lateral limb leads. There are no significant changes on this ECG when compared to the previous ECG on 05/11/2016.  Assessment & Plan by Problem:  Mr. Jirak is a 56 year old man with a history of end-stage renal disease on dialysis, diabetes, and peripheral vascular disease who presents with the acute onset of left facial droop.  MRI  revealed multifocal acute on subacute right MCA territory infarcts including the right basal ganglia and associated severe stenosis of the right M2 origin. Fortunately, his neurologic symptoms have resolved. Neurology has assessed the patient and is concerned about an embolic phenomenon. They are therefore recommending a TEE. During the evaluation he was noted to have a left ventricular ejection fraction of 20-25% with a mildly dilated left ventricle and LVH with grade 1 diastolic dysfunction. Given his end-stage renal disease and peripheral vascular disease ischemia as a cause of his cardiomyopathy will need to be evaluated for.  1) Right MCA infarction: Neurology is concerned for an embolic phenomenon. We will therefore obtain a TEE to further evaluate a potential cardiac source. Unfortunately, the TEE schedule for tomorrow is full and the earliest he can be placed on the schedule is Monday. Given the concern for a potential cardioembolic source we will anticoagulate him with heparin intravenously pending the TEE. Unfortunately, given his end-stage renal disease requiring hemodialysis he would not be a candidate for a DOAC over the weekend and thus a subsequent outpatient TEE. Per neurology we will discontinue the Plavix and start aspirin 81 mg by mouth daily. He previously was not on aspirin given his underlying renal disease yet ability to continue to make urine. At this point the benefit of aspirin outweighs the risks associated with a decrease in urine output in a man already on hemodialysis.  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 Unfortunately, contrast may further damage his kidneys and any urine output he may have, which helps control his volume. Therefore, if he is not a candidate for cardiac catheterization, in their  opinion, he may be a candidate for a Myoview to assess for evidence of ischemia. Given his decent blood pressures and our desire for permissive hypertension we will defer the initiation of an ACE inhibitor until we further sort out his allergy to ace inhibitors. We will also defer the initiation of a beta blocker until after the acute phase of his stroke is successfully navigated.  3) Disposition: Unfortunately his disposition has been markedly  delayed with regards to discharge until he is able to undergo the TEE scheduled for Monday. He will remain on IV heparin until that time. If a cardioembolic source is found he may continue to need heparin as a bridge until he is appropriately anticoagulated with warfarin. If no cardioembolic source is found it is likely that the shower came from the M2 origin severe stenosis with a likely plaque rupture at that point. This would not require anticoagulation, but continued antiplatelet therapy with aspirin. Thus, his disposition is pending the results of the TEE and need for further therapy. Hopefully, the evaluation or cardiac ischemia as a cause of his heart failure can be completed in the interim.

## 2016-06-22 NOTE — Progress Notes (Signed)
SLP Cancellation Note  Patient Details Name: Justin Fitzgerald MRN: [id number] DOB: Jan 24, 1961   Cancelled treatment:       Reason Eval/Treat Not Completed: Patient at procedure or test/unavailable   Blenda Mounts Laurice 06/22/2016, 4:52 PM

## 2016-06-22 NOTE — Procedures (Signed)
Tol HD with no issues.  Using [facility] No hemodynamic issues. Nahsir Venezia C

## 2016-06-22 NOTE — Progress Notes (Signed)
Subjective: Patient with one episode of hypotension overnight. He was evaluated by the overnight team and was found to be asymptomatic with a normal examination. He is remained normotensive with only slight borderline hypertension since. This morning he is feeling well. His facial droop was almost entirely resolved. He denies weakness or sensation differences. He denies headache or vision changes.  Objective:  Vital signs in last 24 hours: Vitals:   06/22/16 0159 06/22/16 0353 06/22/16 0559 06/22/16 0916  BP: (!) 89/53 123/68 113/73 (!) 106/59  Pulse: 94 86 87 91  Resp: 20 20 20 16   Temp: 98.6 F (37 C) 98.2 F (36.8 C) 98.5 F (36.9 C) 97.7 F (36.5 C)  TempSrc: Oral Oral Oral Oral  SpO2: 94% 95% 94% 97%  Weight:       Physical Exam  Constitutional: He is oriented to person, place, and time. He appears well-developed and well-nourished.  Cardiovascular: Normal rate and regular rhythm.  Exam reveals no friction rub.   Respiratory: Effort normal and breath sounds normal.  Neurological: He is alert and oriented to person, place, and time.  No cranial nerve deficits appreciated     Assessment/Plan:  Principal Problem:   CVA (cerebral vascular accident) [facility]) Active Problems:   Essential hypertension   Type 2 diabetes mellitus (HCC)   End stage renal disease (HCC)   Stroke (cerebrum) (HCC)   Peripheral vascular disease (HCC)   Justin Fitzgerald is a 56 year old African-American gentleman with a medical history of end-stage renal disease, hypertension and diabetes who presents with left facial droop upon awakening this morning and a CT scan concerning for acute infarct.   In summary, patient's facial droop has resolved. He denies speech difficulties or weakness. MRI head shows right MCA territory infarcts including right basal ganglia. MRA head shows severe stenosis of the right M2 origin and moderate stenosis of the right M1 and left M2. MRA neck negative. Patient already on  Plavix at the time of stroke. We would appreciate input from neurology regarding antiplatelet therapy for secondary prevention going forward. The patient will have an echocardiogram today to complete his stroke workup and we'll plan for discharge this afternoon.   # Stroke Patient without residual stroke deficits appreciated on examination. MRI demonstrates multifocal right MCA territory infarcts including the right basal ganglia. He has severe stenosis of the right M2 segment. He will need maximal medical therapy including choice of antiplatelets as patient had stroke while on clopidogrel. We'll await neurology recommendations regarding this. We'll plan for discharge this afternoon following completion of his stroke workup with echocardiogram. -- Atorvastatin 80 once daily -- Permissive hypertension with a goal of 220/110 -- Clopidogrel 75 mg  # ESRD on HD TTS ## HTN Patient with end-stage renal disease on hemodialysis. Access via right IJ tunneled catheter. Patient also with a history of hypertension. In the setting of acute infarct we will hold antihypertensive medications and allow for permissive hypertension with a theoretical benefit of perfusing the penumbra. -- Continue current dialysis schedule- will have dialysis today -- Blood pressure goal of 220/110 -- Home meds that are held include Metolazone, furosemide, metoprolol   # DM -- Sliding scale insulin -- Hemoglobin A1c  # Anemia Patient has anemia of chronic kidney disease. Most recent hemoglobin of 9.4. -- EPO outpatient  ? COPD Patient with tiotropium listed as a medication. Prior hospitalization for potential COPD exacerbation. No PFTs on file -- Spiriva -- Albuterol nebulizer q6 hrs prn  DVT/PE prophylaxis: Lovenox FEN/GI: Nothing by  mouth pending speech evaluation Code: Full code   Dispo: Anticipated discharge today pending stroke workup.  Thomasene Lot, MD 06/22/2016, 11:19 AM Pager: [phone]

## 2016-06-22 NOTE — Progress Notes (Signed)
PT Cancellation and Discharge Note  Patient Details Name: Duane Sleight MRN: [id number] DOB: 1961-03-25   Cancelled Treatment:    Reason Eval/Treat Not Completed: PT screened, no needs identified, will sign off.  Spoke with RN and pt, who both indicate pt is independent.  Pt indicates all deficits have returned to baseline.     Alison Murray Dhanya Bogle, PT [phone] 06/22/2016, 9:10 AM

## 2016-06-22 NOTE — Progress Notes (Addendum)
ANTICOAGULATION CONSULT NOTE - Initial Consult  Pharmacy Consult for Heparin (NO BOLUS)  Indication:   Suspected Embolic CVA.    Allergies  Allergen Reactions  . Ramipril Shortness Of Breath  . Aspirin     kidney disease    Patient Measurements: Height: 6' (182.9 cm) (recorded on 05/25/16) Weight: (!) 313 lb 15 oz (142.4 kg) IBW/kg (Calculated) : 77.6 Heparin Dosing Weight: 110 kg  Vital Signs: Temp: 97.7 F (36.5 C) (02/08 1320) Temp Source: Oral (02/08 1326) BP: 105/76 (02/08 1500) Pulse Rate: 84 (02/08 1500)  Labs:  Recent Labs  06/21/16 1205 06/21/16 1223 06/22/16 1422  HGB 9.4* 9.9* 9.4*  HCT 30.8* 29.0* 30.8*  PLT 246  --  251  APTT 32  --   --   LABPROT 12.9  --   --   INR 0.97  --   --   CREATININE 6.68* 7.00* 8.06*    Estimated Creatinine Clearance: 14.8 mL/min (by C-G formula based on SCr of 8.06 mg/dL (H)).   Medical History: Past Medical History:  Diagnosis Date  . Chronic kidney disease   . Diabetes mellitus without complication (HCC)   . Hyperlipidemia   . Hypertension   . Peripheral vascular disease (HCC)     Medications:  Prescriptions Prior to Admission  Medication Sig Dispense Refill Last Dose  . cinacalcet (SENSIPAR) 30 MG tablet Take 4 tablets (120 mg total) by mouth daily with supper. 120 tablet 0 06/20/2016 at Unknown time  . clopidogrel (PLAVIX) 75 MG tablet Take 75 mg by mouth daily.   06/20/2016 at Unknown time  . furosemide (LASIX) 80 MG tablet Take 160 mg by mouth 2 (two) times daily.   06/20/2016 at Unknown time  . gabapentin (NEURONTIN) 300 MG capsule Take 300 mg by mouth at bedtime.    06/20/2016 at Unknown time  . sevelamer carbonate (RENVELA) 2.4 g PACK Take 2.4 g by mouth 3 (three) times daily with meals. 90 each 0 06/20/2016 at Unknown time  . SPIRIVA HANDIHALER 18 MCG inhalation capsule Place 18 mcg into inhaler and inhale daily.    06/20/2016 at Unknown time  . beclomethasone (QVAR) 40 MCG/ACT inhaler Inhale 1 puff into the lungs 2  (two) times daily. (Patient not taking: Reported on 06/21/2016) 1 Inhaler 0 Not Taking at Unknown time   Scheduled:  . [START ON 06/23/2016] aspirin EC  81 mg Oral Daily  . atorvastatin  80 mg Oral q1800  . budesonide (PULMICORT) nebulizer solution  0.25 mg Nebulization BID  . cinacalcet  120 mg Oral Q supper  . gabapentin  300 mg Oral QHS  . insulin aspart  0-5 Units Subcutaneous QHS  . insulin aspart  0-9 Units Subcutaneous TID WC  . sevelamer carbonate  2.4 g Oral TID WC  . tiotropium  18 mcg Inhalation Daily     Assessment: 56 y.o male, admitted on 06/21/16. He has a h/o ESRD on peritoneal dialysis, HTN, T2DM, HLD, possible COPD, and PVD and he presented to ED with left facial droop and slurred speech.  Pharmacy consulted to strart IV Heparin for Suspected Embolic CVA. MD requesting heparin anticoagulation without bolus.  Pt on plavix prior to admission.  He received 1 dose of Lovenox 40mg  SQ on 2/7 @ 17:00-- now discontinued.   Patient not a tPA candidate due to being outside the time window CT Head: no hemorrhage (Dr. Allena Katz confirmed report with radiology) MRI brain 2/7:  no hemorrhage   Goal of Therapy:  Heparin level =  0.3-0.5 units/ml Monitor platelets by anticoagulation protocol: Yes   Plan:  Heparin (no bolus) start drip at 1200 units/ hr Heparin level 6 hours after start of heparin Daily heparin level and CBC.   Noah Delaine, RPh Clinical Pharmacist Pager: [phone] 06/22/2016,4:02 PM

## 2016-06-22 NOTE — Progress Notes (Signed)
Echocardiogram 2D Echocardiogram has been performed with definity.  Justin Fitzgerald 06/22/2016, 12:14 PM

## 2016-06-22 NOTE — Progress Notes (Signed)
Subjective:  Patient had a small episode of hypotension overnight. He was evaluated by the overnight team and found to be assymptomatic. Patient is recovering. Patient reports he has not had any numbness, tingling, headaches or vision changes. His facial droop is not present today  Objective:  Vital signs in last 24 hours: Vitals:   06/22/16 0159 06/22/16 0353 06/22/16 0559 06/22/16 0916  BP: (!) 89/53 123/68 113/73 (!) 106/59  Pulse: 94 86 87 91  Resp: 20 20 20 16   Temp: 98.6 F (37 C) 98.2 F (36.8 C) 98.5 F (36.9 C) 97.7 F (36.5 C)  TempSrc: Oral Oral Oral Oral  SpO2: 94% 95% 94% 97%  Weight:       Physical Exam  Constitutional: He is oriented to person, place, and time. He appears well-developed and well-nourished.  Cardiovascular: Normal rate, regular rhythm and normal heart sounds.   Pulmonary/Chest: Effort normal and breath sounds normal.  Neurological: He is alert and oriented to person, place, and time. No cranial nerve deficit.   CBG (last 3)   Recent Labs  06/21/16 2122 06/22/16 0631 06/22/16 1221  GLUCAP 87 86 82   Imaging MRA/MRI MRI HEAD: Mildly motion degraded examination. Multifocal acute on subacute RIGHT MCA territory infarcts including RIGHT basal ganglia.  Mild to moderate white matter changes compatible chronic small vessel ischemic disease.  MRA HEAD: Moderately motion degraded examination. No emergent large vessel occlusion. Severe stenosis RIGHT M2 origin.  Moderate stenosis RIGHT M1 and LEFT M2 origin.  MRA NECK: Negative noncontrast moderately motion degraded examination.   Electronically Signed   By: Awilda Metro M.D.   On: 06/21/2016 21:18  Assessment/Plan:  Principal Problem:   CVA (cerebral vascular accident) [facility]) Active Problems:   Essential hypertension   Type 2 diabetes mellitus (HCC)   End stage renal disease (HCC)   Stroke (cerebrum) (HCC)   Peripheral vascular disease (HCC)   Justin Fitzgerald is a 56 y.o. male with a h/o ESRD on peritoneal dialysis, HTN, T2DM, HLD, possible COPD, and PVD who presents to ED with left facial droop and slurred speech. CT, MRA MRI and HPI are all concerning for CVA of the MCA.  CVA Patient's facial droop is absent today. He denies any numbness, tingling, headaches or vision changes. CT yesterday showed subtle loss of gray-white differentiation in the posterior right insula and lentiform nucleus suggesting acute ischemia. Head MRI showed multifocal acute on subacute right MCA territory infarcts including right basal ganglia. Head MRA showed severe stenosis in right M2 origin and moderate stenosis in right M1 and left M2 origin. Patient is having echocardiogram today. Plan to discharge this afternoon after echocardiogram. Patient needs to be put on some sort of anticoagulation for stroke prevention as Plavix has failed to do so. Would like some recommendations from Neurology on anticoagulation for stroke prophylaxis.  -- echo today  -- started Lipitor 80 mg  -- cont home Clopidogrel 75 mg  HTN Current guidelines state that in the setting of acute ischemic stroke antihypertensive treatment is warranted in patients with systolic blood pressure greater than 220 mm Hg, receiving thrombolytic therapy, or with concomitant medical issues.  Will allow for hypertension to perfuse penumbra and allow for perfusion around ischemic area. Will hold BP medications for 24 hours. -- permissive hypertension with a goal of 220/110 -- cont holding home Metolazone, Furosemide, Metoprolol   CKD Patient has ESRD 2/2 FSGS. Patient gets HD on Tuesday, Thursday, and Saturday. -- dialysis today  Anemia Anemia  most likely due to CKD.  -- EPO outpatient  COPD Unclear history of COPD. Patient was treated for COPD exacerbation on 05/19/2016 on outpatient.  -- Spiriva -- Albuterol nebulizer q6 hrs prn  DM A1C of 5.6 via Care Everywhere -- Sliding scale insulin --  Hemoglobin A1c  Diet: NPO DVT Prophylaxis: SQH Code Status: FULL  Dispo: Anticipated discharge in approximately today   Caryn Bee, Medical Student 06/22/2016, 11:53 AM Pager: [phone]

## 2016-06-22 NOTE — Progress Notes (Addendum)
STROKE TEAM PROGRESS NOTE   SUBJECTIVE (INTERVAL HISTORY) Patient examined in hemodialysis. I also spoke to the patient's wife separately in his room.  OBJECTIVE Temp:  [97.5 F (36.4 C)-98.7 F (37.1 C)] 97.5 F (36.4 C) (02/08 1730) Pulse Rate:  [73-99] 99 (02/08 1730) Cardiac Rhythm: Normal sinus rhythm (02/08 0700) Resp:  [16-21] 18 (02/08 1730) BP: (89-123)/(53-87) 105/83 (02/08 1730) SpO2:  [94 %-97 %] 97 % (02/08 0916) Weight:  [138.8 kg (306 lb)-142.4 kg (313 lb 15 oz)] 138.8 kg (306 lb) (02/08 1730)   HgbA1c: No results found for: HGBA1C    PHYSICAL EXAM Obese middle-aged [place] American male   . Not in distress.2 plus pedal edema bilaterally.  . Afebrile. Head is nontraumatic. Neck is supple without bruit.    Cardiac exam no murmur or gallop. Lungs are clear to auscultation. Distal pulses are well felt.  Neurological Exam :  Awake alert oriented x 3 normal speech and language. Mild left lower face asymmetry. Tongue midline. No drift. Mild diminished fine finger movements on left. Orbits right over left upper extremity. Mild left grip weak.. Normal sensation . Normal coordination.  ASSESSMENT/PLAN [name] is a 55 y.o. male with history of end-stage renal disease on HD, HTN, diabetes, and peripheral vascular disease presenting with left facial droop and slurred speech. He did not receive IV t-PA due to delay in arrival.   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 Right posterior insula and lentiform nucleus suggesting acute ischemia. Chronic bilateral maxillary disease. Aspects = 8   MRI  multifocal acute on subacute right MCA territory infarcts. Small vessel disease  MRA head  no LVO. Severe stenosis R M2 origin. Moderate stenosis R M1 and L M2 origin  MRA neck negative  2D Echo  EF 20-25% with diffuse hypokinesis  LE venous dopplers pending   TEE to look  for embolic source recommended for tomorrow. DR. Pearlean Brownie spoke with medical student and directed call to [facility] to do.    LDL 98  HgbA1c pending  IV heparin for VTE prophylaxis  Diet renal/carb modified with fluid restriction Diet-HS Snack? Nothing; Room service appropriate? Yes; Fluid consistency: Thin  clopidogrel 75 mg daily prior to admission, now on aspirin 81 mg daily and heparin IV. From stroke standpoint, there is no indication for Anticoagulation/IV heparin for failed plavix without identified embolic source.    Therapy recommendations:  No therapy needs  Disposition:  Return home  Hypertension  Stable Permissive hypertension (OK if < 220/120) but gradually normalize in 5-7 days Long-term BP goal normotensive  Hyperlipidemia  Home meds:  No statin  LDL goal < 70  Now on Lipitor 80 mg daily   Continue statin at discharge  Diabetes  HgbA1c goal < 7.0  Other Stroke Risk Factors  Cigarette smoker  He uses smokeless tobacco   Urine drug screen negative  Morbid Obesity, Body mass index is 41.5 kg/m.  Peripheral vascular disease  Other Active Problems  ESRD on HD  Anemia K the last  No widespread on heparin teaching the teaching service, [facility] day # 1 I have personally examined this patient, reviewed notes, independently viewed imaging studies, participated in medical decision making and plan of care.ROS completed by me personally and pertinent positives fully documented  I have made any additions or clarifications directly to the above note. Agree with note above. He  has presented with left-sided weakness and slurred speech secondary to embolic right middle cerebral artery infarct likely of cardio embolic etiology. Echocardiogram shows diminished ejection fraction and diffuse hypokinesis but no definite clot. TEE would be beneficial to look for intracardiac clot and if confirmed he may need  long term  anticoagulation. Aspirin for stroke prevention will be satisfactory for now unless TEE shows intracardiac clot  or atrial fibrillation is demonstrated. Benefits of short-term IV heparin for diminished EF alone in the absence of definite identified clot are of uncertain benefit and may increase risk for hemorrhagic transformation.Greater than 50% time during this 35 minute visit boss spent on counseling and coordination of care about his stroke, explanation of diagnosis, risk factors, prevention and treatment plan discussion. Discussed with patient, wife and medical student from teaching service.  Delia Heady, MD Medical Director [facility] Pager: [phone] 06/22/2016 11:11 PM   To contact Stroke Continuity provider, please refer to [email or web address]. After hours, contact General Neurology

## 2016-06-22 NOTE — Progress Notes (Signed)
OT Cancellation Note  Patient Details Name: Justin Fitzgerald MRN: [id number] DOB: Aug 25, 1960   Cancelled Treatment:    Reason Eval/Treat Not Completed: Patient at procedure or test/ unavailable.  Will try back.  Glynna Failla [place], OTR/L [phone]   Jeani Hawking M 06/22/2016, 3:00 PM

## 2016-06-22 NOTE — Consult Note (Signed)
HPI: I was asked by Dr. Eppie Gibson to see Justin Fitzgerald who is a 56 y.o. male with a history of ESRD receiving dialysis followed by Dr Candiss Norse in [place].  He received PD for several years and had diff with fluid removal and was converted to a [facility] for HD MWF about a month ago with plans to return to PD.  Unfortunately he was admitted to [facility] with a hx of transiently slurred speech and a L facial droop.  MRI confirmed RMCA region infarcts.  Past Medical History:  Diagnosis Date  . ESRD (end stage renal disease) on dialysis ([place])    "TTS; [facility] [place]" (06/22/2016)  . History of gout   . Hyperlipidemia   . Hypertension   . Peripheral vascular disease ([place])   . Stroke ([place]) 06/20/2016   denies residual on 06/22/2016  . Type II diabetes mellitus ([place])    Past Surgical History:  Procedure Laterality Date  . EXTERIORIZATION OF A CONTINUOUS AMBULATORY PERITONEAL DIALYSIS CATHETER    . FRACTURE SURGERY    . PERIPHERAL VASCULAR CATHETERIZATION N/A 03/08/2016   Procedure: Dialysis/Perma Catheter Insertion;  Surgeon: Katha Cabal, MD;  Location: [place] [facility];  Service: Cardiovascular;  Laterality: N/A;  . PERIPHERAL VASCULAR CATHETERIZATION N/A 04/20/2016   Procedure: Dialysis/Perma Catheter Removal;  Surgeon: Algernon Huxley, MD;  Location: [place] [facility];  Service: Cardiovascular;  Laterality: N/A;  . PERIPHERAL VASCULAR CATHETERIZATION N/A 05/25/2016   Procedure: Dialysis/Perma Catheter Insertion;  Surgeon: Algernon Huxley, MD;  Location: [place] [facility];  Service: Cardiovascular;  Laterality: N/A;  . WRIST FRACTURE SURGERY Right ~ 2000   "got 8 screws in there"   Social History:  reports that he has been smoking Cigarettes.  He has a 20.00 pack-year smoking history. He has quit using smokeless tobacco. His smokeless tobacco use included Chew. He reports that he does not drink alcohol or use drugs. Allergies:  Allergies  Allergen Reactions  . Ramipril Shortness Of  Breath  . Aspirin     kidney disease   Family History  Problem Relation Age of Onset  . Hypertension Mother   . CAD Mother     Medications: reviewed  ROS: Pt reports obesity has prevented him from  Blood pressure 105/83, pulse 99, temperature 97.5 F (36.4 C), temperature source Oral, resp. rate 18, height 6' (1.829 m), weight (!) 138.8 kg (306 lb), SpO2 97 %.  General appearance: obese male  Head: ATNC Eys EOMI Lungs clear Cor RRR, RIJ TDC Abd protuberant, PD cath Ext NO arm access Neuro appropriate  Results for orders placed or performed during the hospital encounter of 06/21/16 (from the past 48 hour(s))  Urine rapid drug screen (hosp performed)not at [facility]     Status: None   Collection Time: 06/21/16  9:33 AM  Result Value Ref Range   Opiates NONE DETECTED NONE DETECTED   Cocaine NONE DETECTED NONE DETECTED   Benzodiazepines NONE DETECTED NONE DETECTED   Amphetamines NONE DETECTED NONE DETECTED   Tetrahydrocannabinol NONE DETECTED NONE DETECTED   Barbiturates NONE DETECTED NONE DETECTED    Comment:        DRUG SCREEN FOR MEDICAL PURPOSES ONLY.  IF CONFIRMATION IS NEEDED FOR ANY PURPOSE, NOTIFY LAB WITHIN 5 DAYS.        LOWEST DETECTABLE LIMITS FOR URINE DRUG SCREEN Drug Class       Cutoff (ng/mL) Amphetamine      1000 Barbiturate      200 Benzodiazepine  629 Tricyclics       528 Opiates          300 Cocaine          300 THC              50   Urinalysis, Routine w reflex microscopic     Status: Abnormal   Collection Time: 06/21/16  9:33 AM  Result Value Ref Range   Color, Urine YELLOW YELLOW   APPearance CLEAR CLEAR   Specific Gravity, Urine 1.008 1.005 - 1.030   pH 7.0 5.0 - 8.0   Glucose, UA 50 (A) NEGATIVE mg/dL   Hgb urine dipstick SMALL (A) NEGATIVE   Bilirubin Urine NEGATIVE NEGATIVE   Ketones, ur NEGATIVE NEGATIVE mg/dL   Protein, ur 100 (A) NEGATIVE mg/dL   Nitrite NEGATIVE NEGATIVE   Leukocytes, UA NEGATIVE NEGATIVE   RBC / HPF NONE SEEN 0  - 5 RBC/hpf   WBC, UA 0-5 0 - 5 WBC/hpf   Bacteria, UA NONE SEEN NONE SEEN   Squamous Epithelial / LPF 0-5 (A) NONE SEEN  CBG monitoring, ED     Status: None   Collection Time: 06/21/16 11:35 AM  Result Value Ref Range   Glucose-Capillary 95 65 - 99 mg/dL  Ethanol     Status: None   Collection Time: 06/21/16 12:05 PM  Result Value Ref Range   Alcohol, Ethyl (B) <5 <5 mg/dL    Comment:        LOWEST DETECTABLE LIMIT FOR SERUM ALCOHOL IS 5 mg/dL FOR MEDICAL PURPOSES ONLY   Protime-INR     Status: None   Collection Time: 06/21/16 12:05 PM  Result Value Ref Range   Prothrombin Time 12.9 11.4 - 15.2 seconds   INR 0.97   APTT     Status: None   Collection Time: 06/21/16 12:05 PM  Result Value Ref Range   aPTT 32 24 - 36 seconds  CBC     Status: Abnormal   Collection Time: 06/21/16 12:05 PM  Result Value Ref Range   WBC 7.3 4.0 - 10.5 K/uL   RBC 3.35 (L) 4.22 - 5.81 MIL/uL   Hemoglobin 9.4 (L) 13.0 - 17.0 g/dL   HCT 30.8 (L) 39.0 - 52.0 %   MCV 91.9 78.0 - 100.0 fL   MCH 28.1 26.0 - 34.0 pg   MCHC 30.5 30.0 - 36.0 g/dL   RDW 17.5 (H) 11.5 - 15.5 %   Platelets 246 150 - 400 K/uL  Differential     Status: None   Collection Time: 06/21/16 12:05 PM  Result Value Ref Range   Neutrophils Relative % 66 %   Neutro Abs 4.8 1.7 - 7.7 K/uL   Lymphocytes Relative 18 %   Lymphs Abs 1.3 0.7 - 4.0 K/uL   Monocytes Relative 9 %   Monocytes Absolute 0.7 0.1 - 1.0 K/uL   Eosinophils Relative 6 %   Eosinophils Absolute 0.4 0.0 - 0.7 K/uL   Basophils Relative 1 %   Basophils Absolute 0.0 0.0 - 0.1 K/uL  Comprehensive metabolic panel     Status: Abnormal   Collection Time: 06/21/16 12:05 PM  Result Value Ref Range   Sodium 137 135 - 145 mmol/L   Potassium 3.6 3.5 - 5.1 mmol/L   Chloride 99 (L) 101 - 111 mmol/L   CO2 25 22 - 32 mmol/L   Glucose, Bld 98 65 - 99 mg/dL   BUN 24 (H) 6 - 20 mg/dL   Creatinine, Ser  6.68 (H) 0.61 - 1.24 mg/dL   Calcium 8.9 8.9 - 10.3 mg/dL   Total Protein  6.7 6.5 - 8.1 g/dL   Albumin 2.9 (L) 3.5 - 5.0 g/dL   AST 19 15 - 41 U/L   ALT 14 (L) 17 - 63 U/L   Alkaline Phosphatase 101 38 - 126 U/L   Total Bilirubin 0.4 0.3 - 1.2 mg/dL   GFR calc non Af Amer 8 (L) >60 mL/min   GFR calc Af Amer 10 (L) >60 mL/min    Comment: (NOTE) The eGFR has been calculated using the CKD EPI equation. This calculation has not been validated in all clinical situations. eGFR's persistently <60 mL/min signify possible Chronic Kidney Disease.    Anion gap 13 5 - 15  I-stat troponin, ED (not at [facility], [facility])     Status: None   Collection Time: 06/21/16 12:21 PM  Result Value Ref Range   Troponin i, poc 0.07 0.00 - 0.08 ng/mL   Comment 3            Comment: Due to the release kinetics of cTnI, a negative result within the first hours of the onset of symptoms does not rule out myocardial infarction with certainty. If myocardial infarction is still suspected, repeat the test at appropriate intervals.   I-Stat Chem 8, ED  (not at [facility], [facility])     Status: Abnormal   Collection Time: 06/21/16 12:23 PM  Result Value Ref Range   Sodium 137 135 - 145 mmol/L   Potassium 3.4 (L) 3.5 - 5.1 mmol/L   Chloride 98 (L) 101 - 111 mmol/L   BUN 25 (H) 6 - 20 mg/dL   Creatinine, Ser 7.00 (H) 0.61 - 1.24 mg/dL   Glucose, Bld 99 65 - 99 mg/dL   Calcium, Ion 1.08 (L) 1.15 - 1.40 mmol/L   TCO2 28 0 - 100 mmol/L   Hemoglobin 9.9 (L) 13.0 - 17.0 g/dL   HCT 29.0 (L) 39.0 - 52.0 %  CBG monitoring, ED     Status: Abnormal   Collection Time: 06/21/16  5:09 PM  Result Value Ref Range   Glucose-Capillary 103 (H) 65 - 99 mg/dL  Glucose, capillary     Status: None   Collection Time: 06/21/16  9:22 PM  Result Value Ref Range   Glucose-Capillary 87 65 - 99 mg/dL  Lipid panel     Status: Abnormal   Collection Time: 06/22/16  2:35 AM  Result Value Ref Range   Cholesterol 170 0 - 200 mg/dL   Triglycerides 185 (H) <150 mg/dL   HDL 35 (L) >40 mg/dL   Total CHOL/HDL Ratio 4.9 RATIO   VLDL  37 0 - 40 mg/dL   LDL Cholesterol 98 0 - 99 mg/dL    Comment:        Total Cholesterol/HDL:CHD Risk Coronary Heart Disease Risk Table                     Men   Women  1/2 Average Risk   3.4   3.3  Average Risk       5.0   4.4  2 X Average Risk   9.6   7.1  3 X Average Risk  23.4   11.0        Use the calculated Patient Ratio above and the CHD Risk Table to determine the patient's CHD Risk.        ATP III CLASSIFICATION (LDL):  <100  mg/dL   Optimal  100-129  mg/dL   Near or Above                    Optimal  130-159  mg/dL   Borderline  160-189  mg/dL   High  >190     mg/dL   Very High   Glucose, capillary     Status: None   Collection Time: 06/22/16  6:31 AM  Result Value Ref Range   Glucose-Capillary 86 65 - 99 mg/dL   Comment 1 Notify RN    Comment 2 Document in Chart   Glucose, capillary     Status: None   Collection Time: 06/22/16 12:21 PM  Result Value Ref Range   Glucose-Capillary 82 65 - 99 mg/dL   Comment 1 Notify RN    Comment 2 Document in Chart   Renal function panel     Status: Abnormal   Collection Time: 06/22/16  2:22 PM  Result Value Ref Range   Sodium 137 135 - 145 mmol/L   Potassium 3.7 3.5 - 5.1 mmol/L   Chloride 96 (L) 101 - 111 mmol/L   CO2 26 22 - 32 mmol/L   Glucose, Bld 106 (H) 65 - 99 mg/dL   BUN 32 (H) 6 - 20 mg/dL   Creatinine, Ser 8.06 (H) 0.61 - 1.24 mg/dL   Calcium 8.7 (L) 8.9 - 10.3 mg/dL   Phosphorus 6.2 (H) 2.5 - 4.6 mg/dL   Albumin 2.7 (L) 3.5 - 5.0 g/dL   GFR calc non Af Amer 7 (L) >60 mL/min   GFR calc Af Amer 8 (L) >60 mL/min    Comment: (NOTE) The eGFR has been calculated using the CKD EPI equation. This calculation has not been validated in all clinical situations. eGFR's persistently <60 mL/min signify possible Chronic Kidney Disease.    Anion gap 15 5 - 15  CBC     Status: Abnormal   Collection Time: 06/22/16  2:22 PM  Result Value Ref Range   WBC 7.2 4.0 - 10.5 K/uL   RBC 3.38 (L) 4.22 - 5.81 MIL/uL   Hemoglobin  9.4 (L) 13.0 - 17.0 g/dL   HCT 30.8 (L) 39.0 - 52.0 %   MCV 91.1 78.0 - 100.0 fL   MCH 27.8 26.0 - 34.0 pg   MCHC 30.5 30.0 - 36.0 g/dL   RDW 17.1 (H) 11.5 - 15.5 %   Platelets 251 150 - 400 K/uL  Glucose, capillary     Status: None   Collection Time: 06/22/16  6:44 PM  Result Value Ref Range   Glucose-Capillary 75 65 - 99 mg/dL   Mr [facility] Wo Contrast  Result Date: 06/21/2016 CLINICAL DATA:  LEFT facial droop and slurred speech beginning this morning. LEFT hand weakness. Follow-up code stroke. History of end-stage on dialysis, hypertension, hyperlipidemia and diabetes. EXAM: MR HEAD WITHOUT CONTRAST MR CIRCLE OF WILLIS WITHOUT CONTRAST MRA OF THE NECK WITHOUT CONTRAST TECHNIQUE: Multiplanar, multiecho pulse sequences of the brain, circle of Willis and surrounding structures were obtained without intravenous contrast. Angiographic images of the neck were obtained using MRA technique without intravenous contrast. COMPARISON:  CT HEAD June 21, 2016 at 1141 hours FINDINGS: MR HEAD FINDINGS- multiple sequences are mildly motion degraded. BRAIN: Subcentimeter focus of reduced diffusion RIGHT frontal and parietal lobes with low ADC values. Patchy reduced diffusion RIGHT basal ganglia and insula with normalizing ADC values in intermediate to bright FLAIR T2 hyperintense signal. No susceptibility artifact to suggest hemorrhage.  The ventricles and sulci are normal for patient's age. Greater than expected scattered subcentimeter supratentorial white matter FLAIR T2 hyperintensities, exclusive of the aforementioned abnormality. No mass effect. No abnormal extra-axial fluid collections. VASCULAR: Normal major intracranial vascular flow voids present at skull base. SKULL AND UPPER CERVICAL SPINE: No abnormal sellar expansion. No suspicious calvarial bone marrow signal. Craniocervical junction maintained. SINUSES/ORBITS: Mild paranasal sinus mucosal thickening. Mastoid air cells are well aerated. The included  ocular globes and orbital contents are non-suspicious. OTHER: None. MR CIRCLE OF WILLIS FINDINGS- moderately motion degraded examination . ANTERIOR CIRCULATION: Normal flow related enhancement of the included cervical, petrous, cavernous and supraclinoid internal carotid arteries. Patent anterior communicating artery. Flow related enhancement of the anterior and middle cerebral arteries, including distal segments. Moderate stenosis distal RIGHT M1 and severe RIGHT M2 origins. Moderate stenosis proximal LEFT M2 origin, superior division. No large vessel occlusion, abnormal luminal irregularity, aneurysm. POSTERIOR CIRCULATION: LEFT vertebral artery is dominant. Basilar artery is patent, with normal flow related enhancement of the main branch vessels. Mild luminal irregularity bilateral vertebral arteries compatible with atherosclerosis. Normal flow related enhancement of the posterior cerebral arteries. No large vessel occlusion, high-grade stenosis, abnormal luminal irregularity, aneurysm. ANATOMIC VARIANTS: None. MRA NECK FINDINGS- limited noncontrast, moderately motion degraded examination. ANTERIOR CIRCULATION: The common carotid arteries are widely patent bilaterally origins not imaged due to time-of-flight technique. The carotid bifurcation is patent bilaterally and there is no hemodynamically significant carotid stenosis by NASCET criteria. No evidence for atherosclerosis or flow limiting stenosis of the cervical internal carotid arteries. POSTERIOR CIRCULATION: Bilateral vertebral arteries are patent to the vertebrobasilar junction, origins not imaged. No evidence for atherosclerosis or flow limiting stenosis. Source images and MIP image were reviewed. IMPRESSION: MRI HEAD: Mildly motion degraded examination. Multifocal acute on subacute RIGHT MCA territory infarcts including RIGHT basal ganglia. Mild to moderate white matter changes compatible chronic small vessel ischemic disease. MRA HEAD: Moderately  motion degraded examination. No emergent large vessel occlusion. Severe stenosis RIGHT M2 origin. Moderate stenosis RIGHT M1 and LEFT M2 origin. MRA NECK: Negative noncontrast moderately motion degraded examination. Electronically Signed   By: Elon Alas M.D.   On: 06/21/2016 21:18   Mr Jodene Nam Neck Wo Contrast  Result Date: 06/21/2016 CLINICAL DATA:  LEFT facial droop and slurred speech beginning this morning. LEFT hand weakness. Follow-up code stroke. History of end-stage on dialysis, hypertension, hyperlipidemia and diabetes. EXAM: MR HEAD WITHOUT CONTRAST MR CIRCLE OF WILLIS WITHOUT CONTRAST MRA OF THE NECK WITHOUT CONTRAST TECHNIQUE: Multiplanar, multiecho pulse sequences of the brain, circle of Willis and surrounding structures were obtained without intravenous contrast. Angiographic images of the neck were obtained using MRA technique without intravenous contrast. COMPARISON:  CT HEAD June 21, 2016 at 1141 hours FINDINGS: MR HEAD FINDINGS- multiple sequences are mildly motion degraded. BRAIN: Subcentimeter focus of reduced diffusion RIGHT frontal and parietal lobes with low ADC values. Patchy reduced diffusion RIGHT basal ganglia and insula with normalizing ADC values in intermediate to bright FLAIR T2 hyperintense signal. No susceptibility artifact to suggest hemorrhage. The ventricles and sulci are normal for patient's age. Greater than expected scattered subcentimeter supratentorial white matter FLAIR T2 hyperintensities, exclusive of the aforementioned abnormality. No mass effect. No abnormal extra-axial fluid collections. VASCULAR: Normal major intracranial vascular flow voids present at skull base. SKULL AND UPPER CERVICAL SPINE: No abnormal sellar expansion. No suspicious calvarial bone marrow signal. Craniocervical junction maintained. SINUSES/ORBITS: Mild paranasal sinus mucosal thickening. Mastoid air cells are well aerated. The included ocular globes and orbital  contents are  non-suspicious. OTHER: None. MR CIRCLE OF WILLIS FINDINGS- moderately motion degraded examination . ANTERIOR CIRCULATION: Normal flow related enhancement of the included cervical, petrous, cavernous and supraclinoid internal carotid arteries. Patent anterior communicating artery. Flow related enhancement of the anterior and middle cerebral arteries, including distal segments. Moderate stenosis distal RIGHT M1 and severe RIGHT M2 origins. Moderate stenosis proximal LEFT M2 origin, superior division. No large vessel occlusion, abnormal luminal irregularity, aneurysm. POSTERIOR CIRCULATION: LEFT vertebral artery is dominant. Basilar artery is patent, with normal flow related enhancement of the main branch vessels. Mild luminal irregularity bilateral vertebral arteries compatible with atherosclerosis. Normal flow related enhancement of the posterior cerebral arteries. No large vessel occlusion, high-grade stenosis, abnormal luminal irregularity, aneurysm. ANATOMIC VARIANTS: None. MRA NECK FINDINGS- limited noncontrast, moderately motion degraded examination. ANTERIOR CIRCULATION: The common carotid arteries are widely patent bilaterally origins not imaged due to time-of-flight technique. The carotid bifurcation is patent bilaterally and there is no hemodynamically significant carotid stenosis by NASCET criteria. No evidence for atherosclerosis or flow limiting stenosis of the cervical internal carotid arteries. POSTERIOR CIRCULATION: Bilateral vertebral arteries are patent to the vertebrobasilar junction, origins not imaged. No evidence for atherosclerosis or flow limiting stenosis. Source images and MIP image were reviewed. IMPRESSION: MRI HEAD: Mildly motion degraded examination. Multifocal acute on subacute RIGHT MCA territory infarcts including RIGHT basal ganglia. Mild to moderate white matter changes compatible chronic small vessel ischemic disease. MRA HEAD: Moderately motion degraded examination. No emergent  large vessel occlusion. Severe stenosis RIGHT M2 origin. Moderate stenosis RIGHT M1 and LEFT M2 origin. MRA NECK: Negative noncontrast moderately motion degraded examination. Electronically Signed   By: Elon Alas M.D.   On: 06/21/2016 21:18   Mr Brain Wo Contrast  Result Date: 06/21/2016 CLINICAL DATA:  LEFT facial droop and slurred speech beginning this morning. LEFT hand weakness. Follow-up code stroke. History of end-stage on dialysis, hypertension, hyperlipidemia and diabetes. EXAM: MR HEAD WITHOUT CONTRAST MR CIRCLE OF WILLIS WITHOUT CONTRAST MRA OF THE NECK WITHOUT CONTRAST TECHNIQUE: Multiplanar, multiecho pulse sequences of the brain, circle of Willis and surrounding structures were obtained without intravenous contrast. Angiographic images of the neck were obtained using MRA technique without intravenous contrast. COMPARISON:  CT HEAD June 21, 2016 at 1141 hours FINDINGS: MR HEAD FINDINGS- multiple sequences are mildly motion degraded. BRAIN: Subcentimeter focus of reduced diffusion RIGHT frontal and parietal lobes with low ADC values. Patchy reduced diffusion RIGHT basal ganglia and insula with normalizing ADC values in intermediate to bright FLAIR T2 hyperintense signal. No susceptibility artifact to suggest hemorrhage. The ventricles and sulci are normal for patient's age. Greater than expected scattered subcentimeter supratentorial white matter FLAIR T2 hyperintensities, exclusive of the aforementioned abnormality. No mass effect. No abnormal extra-axial fluid collections. VASCULAR: Normal major intracranial vascular flow voids present at skull base. SKULL AND UPPER CERVICAL SPINE: No abnormal sellar expansion. No suspicious calvarial bone marrow signal. Craniocervical junction maintained. SINUSES/ORBITS: Mild paranasal sinus mucosal thickening. Mastoid air cells are well aerated. The included ocular globes and orbital contents are non-suspicious. OTHER: None. MR CIRCLE OF WILLIS FINDINGS-  moderately motion degraded examination . ANTERIOR CIRCULATION: Normal flow related enhancement of the included cervical, petrous, cavernous and supraclinoid internal carotid arteries. Patent anterior communicating artery. Flow related enhancement of the anterior and middle cerebral arteries, including distal segments. Moderate stenosis distal RIGHT M1 and severe RIGHT M2 origins. Moderate stenosis proximal LEFT M2 origin, superior division. No large vessel occlusion, abnormal luminal irregularity, aneurysm. POSTERIOR CIRCULATION: LEFT vertebral artery  is dominant. Basilar artery is patent, with normal flow related enhancement of the main branch vessels. Mild luminal irregularity bilateral vertebral arteries compatible with atherosclerosis. Normal flow related enhancement of the posterior cerebral arteries. No large vessel occlusion, high-grade stenosis, abnormal luminal irregularity, aneurysm. ANATOMIC VARIANTS: None. MRA NECK FINDINGS- limited noncontrast, moderately motion degraded examination. ANTERIOR CIRCULATION: The common carotid arteries are widely patent bilaterally origins not imaged due to time-of-flight technique. The carotid bifurcation is patent bilaterally and there is no hemodynamically significant carotid stenosis by NASCET criteria. No evidence for atherosclerosis or flow limiting stenosis of the cervical internal carotid arteries. POSTERIOR CIRCULATION: Bilateral vertebral arteries are patent to the vertebrobasilar junction, origins not imaged. No evidence for atherosclerosis or flow limiting stenosis. Source images and MIP image were reviewed. IMPRESSION: MRI HEAD: Mildly motion degraded examination. Multifocal acute on subacute RIGHT MCA territory infarcts including RIGHT basal ganglia. Mild to moderate white matter changes compatible chronic small vessel ischemic disease. MRA HEAD: Moderately motion degraded examination. No emergent large vessel occlusion. Severe stenosis RIGHT M2 origin.  Moderate stenosis RIGHT M1 and LEFT M2 origin. MRA NECK: Negative noncontrast moderately motion degraded examination. Electronically Signed   By: Elon Alas M.D.   On: 06/21/2016 21:18   Ct Head Code Stroke W/o Cm  Result Date: 06/21/2016 CLINICAL DATA:  Code stroke. Code stroke. Left-sided weakness and slurred speech. Facial droop. EXAM: CT HEAD WITHOUT CONTRAST TECHNIQUE: Contiguous axial images were obtained from the base of the skull through the vertex without intravenous contrast. COMPARISON:  None. FINDINGS: Brain: There is subtle loss of gray-white differentiation along the posterior right insula and in the right lentiform nucleus. Fall this could be artifact, early ischemia is certainly considered. Acute hemorrhage or mass lesion is present. Cortex over the convexities is within normal limits. The ventricles are of normal size. No significant extra-axial fluid collection is present. Vascular: Atherosclerotic calcifications are present within the cavernous internal carotid arteries. There is no hyperdense vessel. A calcification is evident near the right MCA bifurcation. There is no hyperdense vessel. Skull: The calvarium is within normal limits. Sinuses/Orbits: Chronic bilateral maxillary sinus disease is present. There is soft tissue opacification of the ostiomeatal complex bilaterally. This may represent polyp point disease. The globes and orbits are within normal limits. ASPECTS [facility] Stroke Program Early CT Score) - Ganglionic level infarction (caudate, lentiform nuclei, internal capsule, insula, M1-M3 cortex): 5/7. Points lost for the posterior right insula and lentiform nucleus. - Supraganglionic infarction (M4-M6 cortex): 3/3 Total score (0-10 with 10 being normal): 8/10 IMPRESSION: 1. Subtle loss of gray-white differentiation in the posterior right insula and lentiform nucleus suggesting acute ischemia. 2. Chronic bilateral maxillary sinus disease 3. ASPECTS is 8/10 Electronically Signed    By: San More[place] M.D.   On: 06/21/2016 11:58    Assessment:  1 ESRD on HD currently via [facility] but also has PD catheter 2 CVA Plan: 1 HD judiciously with attention to avoid hypotension 2 HD MWF  Denia Mcvicar C  [place]iana Shor C 06/22/2016, 9:22 PM

## 2016-06-22 NOTE — Care Management Note (Signed)
Case Management Note  Patient Details  Name: Justin Fitzgerald MRN: [id number] Date of Birth: 1960/06/26  Subjective/Objective:           Patient was admitted with CVA. Lives at home with spouse. CM will follow for discharge needs pending PT/OT evals and physician orders.          Action/Plan:   Expected Discharge Date:                  Expected Discharge Plan:     In-House Referral:     Discharge planning Services     Post Acute Care Choice:    Choice offered to:     DME Arranged:    DME Agency:     HH Arranged:    HH Agency:     Status of Service:     If discussed at [employer] of Stay Meetings, dates discussed:    Additional Comments:  Anda Kraft, RN 06/22/2016, 10:03 AM

## 2016-06-23 ENCOUNTER — Inpatient Hospital Stay (HOSPITAL_COMMUNITY): Payer: Medicare Other

## 2016-06-23 ENCOUNTER — Encounter (HOSPITAL_COMMUNITY)
Admission: EM | Disposition: A | Discharge status: Home / Self Care | Payer: Self-pay | Source: Home / Self Care | Attending: Internal Medicine

## 2016-06-23 DIAGNOSIS — I5040 Unspecified combined systolic (congestive) and diastolic (congestive) heart failure: Secondary | ICD-10-CM

## 2016-06-23 DIAGNOSIS — I5022 Chronic systolic (congestive) heart failure: Secondary | ICD-10-CM

## 2016-06-23 DIAGNOSIS — I132 Hypertensive heart and chronic kidney disease with heart failure and with stage 5 chronic kidney disease, or end stage renal disease: Secondary | ICD-10-CM

## 2016-06-23 DIAGNOSIS — I63411 Cerebral infarction due to embolism of right middle cerebral artery: Principal | ICD-10-CM

## 2016-06-23 DIAGNOSIS — R609 Edema, unspecified: Secondary | ICD-10-CM

## 2016-06-23 HISTORY — PX: RIGHT/LEFT HEART CATH AND CORONARY ANGIOGRAPHY: CATH118266

## 2016-06-23 LAB — GLUCOSE, CAPILLARY
GLUCOSE-CAPILLARY: 98 mg/dL (ref 65–99)
GLUCOSE-CAPILLARY: 88 mg/dL (ref 65–99)
Glucose-Capillary: 88 mg/dL (ref 65–99)
Glucose-Capillary: 112 mg/dL — ABNORMAL HIGH (ref 65–99)

## 2016-06-23 LAB — RENAL FUNCTION PANEL
ANION GAP: 12 (ref 5–15)
Albumin: 3.1 g/dL — ABNORMAL LOW (ref 3.5–5.0)
BUN: 19 mg/dL (ref 6–20)
CHLORIDE: 99 mmol/L — AB (ref 101–111)
CO2: 26 mmol/L (ref 22–32)
Calcium: 9.1 mg/dL (ref 8.9–10.3)
Creatinine, Ser: 5.91 mg/dL — ABNORMAL HIGH (ref 0.61–1.24)
GFR calc Af Amer: 11 mL/min — ABNORMAL LOW (ref >60)
GFR calc non Af Amer: 10 mL/min — ABNORMAL LOW (ref >60)
GLUCOSE: 90 mg/dL (ref 65–99)
POTASSIUM: 3.9 mmol/L (ref 3.5–5.1)
Phosphorus: 5.1 mg/dL — ABNORMAL HIGH (ref 2.5–4.6)
Sodium: 137 mmol/L (ref 135–145)

## 2016-06-23 LAB — CBC
HCT: 31.4 % — ABNORMAL LOW (ref 39.0–52.0)
Hemoglobin: 9.7 g/dL — ABNORMAL LOW (ref 13.0–17.0)
MCH: 28.2 pg (ref 26.0–34.0)
MCHC: 30.9 g/dL (ref 30.0–36.0)
MCV: 91.3 fL (ref 78.0–100.0)
PLATELETS: 191 10*3/uL (ref 150–400)
RBC: 3.44 MIL/uL — ABNORMAL LOW (ref 4.22–5.81)
RDW: 17 % — AB (ref 11.5–15.5)
WBC: 7.7 10*3/uL (ref 4.0–10.5)

## 2016-06-23 LAB — POCT I-STAT 3, ART BLOOD GAS (G3+)
Acid-Base Excess: 2 mmol/L (ref 0.0–2.0)
BICARBONATE: 27.2 mmol/L (ref 20.0–28.0)
O2 Saturation: 97 %
PH ART: 7.392 (ref 7.350–7.450)
PO2 ART: 90 mmHg (ref 83.0–108.0)
TCO2: 29 mmol/L (ref 0–100)
pCO2 arterial: 44.8 mmHg (ref 32.0–48.0)

## 2016-06-23 LAB — POCT I-STAT 3, VENOUS BLOOD GAS (G3P V)
Acid-Base Excess: 5 mmol/L — ABNORMAL HIGH (ref 0.0–2.0)
Bicarbonate: 30.2 mmol/L — ABNORMAL HIGH (ref 20.0–28.0)
O2 Saturation: 69 %
PCO2 VEN: 45.7 mmHg (ref 44.0–60.0)
PO2 VEN: 35 mmHg (ref 32.0–45.0)
TCO2: 32 mmol/L (ref 0–100)
pH, Ven: 7.428 (ref 7.250–7.430)

## 2016-06-23 LAB — HEMOGLOBIN A1C
HEMOGLOBIN A1C: 5 % (ref 4.8–5.6)
MEAN PLASMA GLUCOSE: 97 mg/dL

## 2016-06-23 LAB — TSH: TSH: 2.302 u[IU]/mL (ref 0.350–4.500)

## 2016-06-23 LAB — HEPARIN LEVEL (UNFRACTIONATED): Heparin Unfractionated: 0.13 IU/mL — ABNORMAL LOW (ref 0.30–0.70)

## 2016-06-23 SURGERY — RIGHT/LEFT HEART CATH AND CORONARY ANGIOGRAPHY
Anesthesia: LOCAL

## 2016-06-23 MED ORDER — LIDOCAINE HCL (PF) 1 % IJ SOLN
INTRAMUSCULAR | Status: DC | PRN
  Administered 2016-06-23: 20 mL

## 2016-06-23 MED ORDER — ASPIRIN EC 81 MG PO TBEC
81.0000 mg | DELAYED_RELEASE_TABLET | Freq: Every day | ORAL | Status: DC
  Administered 2016-06-24 – 2016-06-26 (×3): 81 mg via ORAL
  Filled 2016-06-23 (×4): qty 1

## 2016-06-23 MED ORDER — HEPARIN (PORCINE) IN NACL 2-0.9 UNIT/ML-% IJ SOLN
INTRAMUSCULAR | Status: AC
  Filled 2016-06-23: qty 1000

## 2016-06-23 MED ORDER — HEPARIN (PORCINE) IN NACL 2-0.9 UNIT/ML-% IJ SOLN
INTRAMUSCULAR | Status: DC | PRN
  Administered 2016-06-23: 1500 mL

## 2016-06-23 MED ORDER — SODIUM CHLORIDE 0.9% FLUSH
3.0000 mL | Freq: Two times a day (BID) | INTRAVENOUS | Status: DC
  Administered 2016-06-23: 3 mL via INTRAVENOUS

## 2016-06-23 MED ORDER — SODIUM CHLORIDE 0.9 % IV SOLN
INTRAVENOUS | Status: DC
  Administered 2016-06-23 (×2): via INTRAVENOUS

## 2016-06-23 MED ORDER — HEPARIN SODIUM (PORCINE) 5000 UNIT/ML IJ SOLN
5000.0000 [IU] | Freq: Three times a day (TID) | INTRAMUSCULAR | Status: DC
  Administered 2016-06-23 – 2016-06-26 (×8): 5000 [IU] via SUBCUTANEOUS
  Filled 2016-06-23 (×7): qty 1

## 2016-06-23 MED ORDER — ASPIRIN 81 MG PO CHEW
81.0000 mg | CHEWABLE_TABLET | ORAL | Status: AC
  Administered 2016-06-23: 81 mg via ORAL
  Filled 2016-06-23: qty 1

## 2016-06-23 MED ORDER — FENTANYL CITRATE (PF) 100 MCG/2ML IJ SOLN
INTRAMUSCULAR | Status: DC | PRN
  Administered 2016-06-23: 50 ug via INTRAVENOUS
  Administered 2016-06-23 (×2): 25 ug via INTRAVENOUS

## 2016-06-23 MED ORDER — SODIUM CHLORIDE 0.9 % IV SOLN: INTRAVENOUS | Status: DC

## 2016-06-23 MED ORDER — IOPAMIDOL (ISOVUE-370) INJECTION 76%
INTRAVENOUS | Status: DC | PRN
  Administered 2016-06-23: 60 mL via INTRA_ARTERIAL

## 2016-06-23 MED ORDER — FENTANYL CITRATE (PF) 100 MCG/2ML IJ SOLN
INTRAMUSCULAR | Status: AC
  Filled 2016-06-23: qty 2

## 2016-06-23 MED ORDER — SODIUM CHLORIDE 0.9 % IV SOLN: 250.0000 mL | INTRAVENOUS | Status: DC | PRN

## 2016-06-23 MED ORDER — ONDANSETRON HCL 4 MG/2ML IJ SOLN: 4.0000 mg | Freq: Four times a day (QID) | INTRAMUSCULAR | Status: DC | PRN

## 2016-06-23 MED ORDER — SODIUM CHLORIDE 0.9% FLUSH: 3.0000 mL | INTRAVENOUS | Status: DC | PRN

## 2016-06-23 MED ORDER — SODIUM CHLORIDE 0.9% FLUSH
3.0000 mL | INTRAVENOUS | Status: DC | PRN
  Administered 2016-06-23: 3 mL via INTRAVENOUS
  Filled 2016-06-23: qty 3

## 2016-06-23 MED ORDER — SODIUM CHLORIDE 0.9% FLUSH
3.0000 mL | Freq: Two times a day (BID) | INTRAVENOUS | Status: DC
  Administered 2016-06-23 – 2016-06-25 (×4): 3 mL via INTRAVENOUS

## 2016-06-23 MED ORDER — IOPAMIDOL (ISOVUE-370) INJECTION 76%
INTRAVENOUS | Status: AC
  Filled 2016-06-23: qty 100

## 2016-06-23 MED ORDER — ACETAMINOPHEN 325 MG PO TABS
650.0000 mg | ORAL_TABLET | ORAL | Status: DC | PRN
  Administered 2016-06-24: 650 mg via ORAL

## 2016-06-23 MED ORDER — MIDAZOLAM HCL 2 MG/2ML IJ SOLN
INTRAMUSCULAR | Status: AC
  Filled 2016-06-23: qty 2

## 2016-06-23 MED ORDER — MIDAZOLAM HCL 2 MG/2ML IJ SOLN
INTRAMUSCULAR | Status: DC | PRN
  Administered 2016-06-23 (×2): 1 mg via INTRAVENOUS

## 2016-06-23 MED ORDER — SODIUM CHLORIDE 0.9 % IV SOLN
250.0000 mL | INTRAVENOUS | Status: DC | PRN
  Administered 2016-06-26: 11:00:00 via INTRAVENOUS

## 2016-06-23 MED ORDER — LIDOCAINE HCL (PF) 1 % IJ SOLN
INTRAMUSCULAR | Status: AC
  Filled 2016-06-23: qty 30

## 2016-06-23 MED ORDER — CLOPIDOGREL BISULFATE 75 MG PO TABS
75.0000 mg | ORAL_TABLET | Freq: Every day | ORAL | Status: DC
  Administered 2016-06-23 – 2016-06-26 (×4): 75 mg via ORAL
  Filled 2016-06-23 (×4): qty 1

## 2016-06-23 SURGICAL SUPPLY — 11 items
CATH INFINITI 5FR MULTPACK ANG (CATHETERS) ×2 IMPLANT
CATH SWAN GANZ 7F STRAIGHT (CATHETERS) ×2 IMPLANT
HOVERMATT SINGLE USE (MISCELLANEOUS) ×2 IMPLANT
KIT HEART LEFT (KITS) ×2 IMPLANT
KIT HEART RIGHT NAMIC (KITS) ×2 IMPLANT
PACK CARDIAC CATHETERIZATION (CUSTOM PROCEDURE TRAY) ×2 IMPLANT
SHEATH PINNACLE 5F 10CM (SHEATH) ×2 IMPLANT
SHEATH PINNACLE 7F 10CM (SHEATH) ×2 IMPLANT
TRANSDUCER W/STOPCOCK (MISCELLANEOUS) ×2 IMPLANT
TUBING CIL FLEX 10 FLL-RA (TUBING) ×2 IMPLANT
WIRE EMERALD 3MM-J .035X150CM (WIRE) ×2 IMPLANT

## 2016-06-23 NOTE — Progress Notes (Signed)
Assessment:  1 ESRD on HD currently via [facility] but also has PD catheter 2 CVA Plan: 1 HD judiciously with attention to avoid hypotension 2 HD TTS   Subjective: Interval History: no complaints  Objective: Vital signs in last 24 hours: Temp:  [97.5 F (36.4 C)-98.9 F (37.2 C)] 98.9 F (37.2 C) (02/09 1418) Pulse Rate:  [73-100] 92 (02/09 1418) Resp:  [18-20] 18 (02/09 1418) BP: (91-108)/(53-87) 100/53 (02/09 1418) SpO2:  [97 %-100 %] 99 % (02/09 1418) Weight:  [138.8 kg (306 lb)] 138.8 kg (306 lb) (02/08 1730) Weight change: -2.9 kg (-6 lb 6.3 oz)  Intake/Output from previous day: 02/08 0701 - 02/09 0700 In: 120 [P.O.:120] Out: 3000  Intake/Output this shift: Total I/O In: 120 [P.O.:120] Out: -   no change  Has periumbilical hernia and PF cath and RI TDC.  Lab Results:  Recent Labs  06/22/16 1422 06/23/16 0025  WBC 7.2 7.7  HGB 9.4* 9.7*  HCT 30.8* 31.4*  PLT 251 191   BMET:  Recent Labs  06/22/16 1422 06/23/16 0722  NA 137 137  K 3.7 3.9  CL 96* 99*  CO2 26 26  GLUCOSE 106* 90  BUN 32* 19  CREATININE 8.06* 5.91*  CALCIUM 8.7* 9.1   No results for input(s): PTH in the last 72 hours. Iron Studies: No results for input(s): IRON, TIBC, TRANSFERRIN, FERRITIN in the last 72 hours. Studies/Results: [name] WJ Contrast  Result Date: 06/21/2016 CLINICAL DATA:  LEFT facial droop and slurred speech beginning this morning. LEFT hand weakness. Follow-up code stroke. History of end-stage on dialysis, hypertension, hyperlipidemia and diabetes. EXAM: MR HEAD WITHOUT CONTRAST MR CIRCLE OF WILLIS WITHOUT CONTRAST MRA OF THE NECK WITHOUT CONTRAST TECHNIQUE: Multiplanar, multiecho pulse sequences of the brain, circle of Willis and surrounding structures were obtained without intravenous contrast. Angiographic images of the neck were obtained using MRA technique without intravenous contrast. COMPARISON:  CT HEAD June 21, 2016 at 1141 hours FINDINGS: MR HEAD  FINDINGS- multiple sequences are mildly motion degraded. BRAIN: Subcentimeter focus of reduced diffusion RIGHT frontal and parietal lobes with low ADC values. Patchy reduced diffusion RIGHT basal ganglia and insula with normalizing ADC values in intermediate to bright FLAIR T2 hyperintense signal. No susceptibility artifact to suggest hemorrhage. The ventricles and sulci are normal for patient's age. Greater than expected scattered subcentimeter supratentorial white matter FLAIR T2 hyperintensities, exclusive of the aforementioned abnormality. No mass effect. No abnormal extra-axial fluid collections. VASCULAR: Normal major intracranial vascular flow voids present at skull base. SKULL AND UPPER CERVICAL SPINE: No abnormal sellar expansion. No suspicious calvarial bone marrow signal. Craniocervical junction maintained. SINUSES/ORBITS: Mild paranasal sinus mucosal thickening. Mastoid air cells are well aerated. The included ocular globes and orbital contents are non-suspicious. OTHER: None. MR CIRCLE OF WILLIS FINDINGS- moderately motion degraded examination . ANTERIOR CIRCULATION: Normal flow related enhancement of the included cervical, petrous, cavernous and supraclinoid internal carotid arteries. Patent anterior communicating artery. Flow related enhancement of the anterior and middle cerebral arteries, including distal segments. Moderate stenosis distal RIGHT M1 and severe RIGHT M2 origins. Moderate stenosis proximal LEFT M2 origin, superior division. No large vessel occlusion, abnormal luminal irregularity, aneurysm. POSTERIOR CIRCULATION: LEFT vertebral artery is dominant. Basilar artery is patent, with normal flow related enhancement of the main branch vessels. Mild luminal irregularity bilateral vertebral arteries compatible with atherosclerosis. Normal flow related enhancement of the posterior cerebral arteries. No large vessel occlusion, high-grade stenosis, abnormal luminal irregularity, aneurysm. ANATOMIC  VARIANTS: None. MRA NECK  FINDINGS- limited noncontrast, moderately motion degraded examination. ANTERIOR CIRCULATION: The common carotid arteries are widely patent bilaterally origins not imaged due to time-of-flight technique. The carotid bifurcation is patent bilaterally and there is no hemodynamically significant carotid stenosis by NASCET criteria. No evidence for atherosclerosis or flow limiting stenosis of the cervical internal carotid arteries. POSTERIOR CIRCULATION: Bilateral vertebral arteries are patent to the vertebrobasilar junction, origins not imaged. No evidence for atherosclerosis or flow limiting stenosis. Source images and MIP image were reviewed. IMPRESSION: MRI HEAD: Mildly motion degraded examination. Multifocal acute on subacute RIGHT MCA territory infarcts including RIGHT basal ganglia. Mild to moderate white matter changes compatible chronic small vessel ischemic disease. MRA HEAD: Moderately motion degraded examination. No emergent large vessel occlusion. Severe stenosis RIGHT M2 origin. Moderate stenosis RIGHT M1 and LEFT M2 origin. MRA NECK: Negative noncontrast moderately motion degraded examination. Electronically Signed   By: Awilda Metro M.D.   On: 06/21/2016 21:18   Mr Maxine Glenn Neck Wo Contrast  Result Date: 06/21/2016 CLINICAL DATA:  LEFT facial droop and slurred speech beginning this morning. LEFT hand weakness. Follow-up code stroke. History of end-stage on dialysis, hypertension, hyperlipidemia and diabetes. EXAM: MR HEAD WITHOUT CONTRAST MR CIRCLE OF WILLIS WITHOUT CONTRAST MRA OF THE NECK WITHOUT CONTRAST TECHNIQUE: Multiplanar, multiecho pulse sequences of the brain, circle of Willis and surrounding structures were obtained without intravenous contrast. Angiographic images of the neck were obtained using MRA technique without intravenous contrast. COMPARISON:  CT HEAD June 21, 2016 at 1141 hours FINDINGS: MR HEAD FINDINGS- multiple sequences are mildly motion degraded.  BRAIN: Subcentimeter focus of reduced diffusion RIGHT frontal and parietal lobes with low ADC values. Patchy reduced diffusion RIGHT basal ganglia and insula with normalizing ADC values in intermediate to bright FLAIR T2 hyperintense signal. No susceptibility artifact to suggest hemorrhage. The ventricles and sulci are normal for patient's age. Greater than expected scattered subcentimeter supratentorial white matter FLAIR T2 hyperintensities, exclusive of the aforementioned abnormality. No mass effect. No abnormal extra-axial fluid collections. VASCULAR: Normal major intracranial vascular flow voids present at skull base. SKULL AND UPPER CERVICAL SPINE: No abnormal sellar expansion. No suspicious calvarial bone marrow signal. Craniocervical junction maintained. SINUSES/ORBITS: Mild paranasal sinus mucosal thickening. Mastoid air cells are well aerated. The included ocular globes and orbital contents are non-suspicious. OTHER: None. MR CIRCLE OF WILLIS FINDINGS- moderately motion degraded examination . ANTERIOR CIRCULATION: Normal flow related enhancement of the included cervical, petrous, cavernous and supraclinoid internal carotid arteries. Patent anterior communicating artery. Flow related enhancement of the anterior and middle cerebral arteries, including distal segments. Moderate stenosis distal RIGHT M1 and severe RIGHT M2 origins. Moderate stenosis proximal LEFT M2 origin, superior division. No large vessel occlusion, abnormal luminal irregularity, aneurysm. POSTERIOR CIRCULATION: LEFT vertebral artery is dominant. Basilar artery is patent, with normal flow related enhancement of the main branch vessels. Mild luminal irregularity bilateral vertebral arteries compatible with atherosclerosis. Normal flow related enhancement of the posterior cerebral arteries. No large vessel occlusion, high-grade stenosis, abnormal luminal irregularity, aneurysm. ANATOMIC VARIANTS: None. MRA NECK FINDINGS- limited noncontrast,  moderately motion degraded examination. ANTERIOR CIRCULATION: The common carotid arteries are widely patent bilaterally origins not imaged due to time-of-flight technique. The carotid bifurcation is patent bilaterally and there is no hemodynamically significant carotid stenosis by NASCET criteria. No evidence for atherosclerosis or flow limiting stenosis of the cervical internal carotid arteries. POSTERIOR CIRCULATION: Bilateral vertebral arteries are patent to the vertebrobasilar junction, origins not imaged. No evidence for atherosclerosis or flow limiting stenosis. Source images and  MIP image were reviewed. IMPRESSION: MRI HEAD: Mildly motion degraded examination. Multifocal acute on subacute RIGHT MCA territory infarcts including RIGHT basal ganglia. Mild to moderate white matter changes compatible chronic small vessel ischemic disease. MRA HEAD: Moderately motion degraded examination. No emergent large vessel occlusion. Severe stenosis RIGHT M2 origin. Moderate stenosis RIGHT M1 and LEFT M2 origin. MRA NECK: Negative noncontrast moderately motion degraded examination. Electronically Signed   By: Awilda Metro M.D.   On: 06/21/2016 21:18   Mr Brain Wo Contrast  Result Date: 06/21/2016 CLINICAL DATA:  LEFT facial droop and slurred speech beginning this morning. LEFT hand weakness. Follow-up code stroke. History of end-stage on dialysis, hypertension, hyperlipidemia and diabetes. EXAM: MR HEAD WITHOUT CONTRAST MR CIRCLE OF WILLIS WITHOUT CONTRAST MRA OF THE NECK WITHOUT CONTRAST TECHNIQUE: Multiplanar, multiecho pulse sequences of the brain, circle of Willis and surrounding structures were obtained without intravenous contrast. Angiographic images of the neck were obtained using MRA technique without intravenous contrast. COMPARISON:  CT HEAD June 21, 2016 at 1141 hours FINDINGS: MR HEAD FINDINGS- multiple sequences are mildly motion degraded. BRAIN: Subcentimeter focus of reduced diffusion RIGHT frontal  and parietal lobes with low ADC values. Patchy reduced diffusion RIGHT basal ganglia and insula with normalizing ADC values in intermediate to bright FLAIR T2 hyperintense signal. No susceptibility artifact to suggest hemorrhage. The ventricles and sulci are normal for patient's age. Greater than expected scattered subcentimeter supratentorial white matter FLAIR T2 hyperintensities, exclusive of the aforementioned abnormality. No mass effect. No abnormal extra-axial fluid collections. VASCULAR: Normal major intracranial vascular flow voids present at skull base. SKULL AND UPPER CERVICAL SPINE: No abnormal sellar expansion. No suspicious calvarial bone marrow signal. Craniocervical junction maintained. SINUSES/ORBITS: Mild paranasal sinus mucosal thickening. Mastoid air cells are well aerated. The included ocular globes and orbital contents are non-suspicious. OTHER: None. MR CIRCLE OF WILLIS FINDINGS- moderately motion degraded examination . ANTERIOR CIRCULATION: Normal flow related enhancement of the included cervical, petrous, cavernous and supraclinoid internal carotid arteries. Patent anterior communicating artery. Flow related enhancement of the anterior and middle cerebral arteries, including distal segments. Moderate stenosis distal RIGHT M1 and severe RIGHT M2 origins. Moderate stenosis proximal LEFT M2 origin, superior division. No large vessel occlusion, abnormal luminal irregularity, aneurysm. POSTERIOR CIRCULATION: LEFT vertebral artery is dominant. Basilar artery is patent, with normal flow related enhancement of the main branch vessels. Mild luminal irregularity bilateral vertebral arteries compatible with atherosclerosis. Normal flow related enhancement of the posterior cerebral arteries. No large vessel occlusion, high-grade stenosis, abnormal luminal irregularity, aneurysm. ANATOMIC VARIANTS: None. MRA NECK FINDINGS- limited noncontrast, moderately motion degraded examination. ANTERIOR CIRCULATION:  The common carotid arteries are widely patent bilaterally origins not imaged due to time-of-flight technique. The carotid bifurcation is patent bilaterally and there is no hemodynamically significant carotid stenosis by NASCET criteria. No evidence for atherosclerosis or flow limiting stenosis of the cervical internal carotid arteries. POSTERIOR CIRCULATION: Bilateral vertebral arteries are patent to the vertebrobasilar junction, origins not imaged. No evidence for atherosclerosis or flow limiting stenosis. Source images and MIP image were reviewed. IMPRESSION: MRI HEAD: Mildly motion degraded examination. Multifocal acute on subacute RIGHT MCA territory infarcts including RIGHT basal ganglia. Mild to moderate white matter changes compatible chronic small vessel ischemic disease. MRA HEAD: Moderately motion degraded examination. No emergent large vessel occlusion. Severe stenosis RIGHT M2 origin. Moderate stenosis RIGHT M1 and LEFT M2 origin. MRA NECK: Negative noncontrast moderately motion degraded examination. Electronically Signed   By: Awilda Metro M.D.   On: 06/21/2016 21:18  Scheduled: . [START ON 06/24/2016] aspirin EC  81 mg Oral Daily  . atorvastatin  80 mg Oral q1800  . budesonide (PULMICORT) nebulizer solution  0.25 mg Nebulization BID  . cinacalcet  120 mg Oral Q supper  . clopidogrel  75 mg Oral Daily  . gabapentin  300 mg Oral QHS  . heparin subcutaneous  5,000 Units Subcutaneous Q8H  . insulin aspart  0-5 Units Subcutaneous QHS  . insulin aspart  0-9 Units Subcutaneous TID WC  . sevelamer carbonate  2.4 g Oral TID WC  . sodium chloride flush  3 mL Intravenous Q12H  . tiotropium  18 mcg Inhalation Daily     LOS: 2 days   Trellis Guirguis C 06/23/2016,2:29 PM

## 2016-06-23 NOTE — Evaluation (Signed)
Speech Language Pathology Evaluation Patient Details Name: Justin Fitzgerald MRN: [id number] DOB: 12/25/1960 Today's Date: 06/23/2016 Time: 3500-9381 SLP Time Calculation (min) (ACUTE ONLY): 20 min  Problem List:  Patient Active Problem List   Diagnosis Date Noted  . Chronic combined systolic and diastolic congestive heart failure (HCC)   . CVA (cerebral vascular accident) (HCC) 06/21/2016  . Acute ischemic stroke (HCC) 06/21/2016  . Peripheral vascular disease (HCC) 06/21/2016  . Acute respiratory failure (HCC) 05/11/2016  . Peritonitis (HCC) 05/11/2016  . End stage renal disease (HCC) 03/27/2016  . Complication from renal dialysis device 03/27/2016  . Chronic renal insufficiency, stage 4 (severe) (HCC) 02/28/2016  . Lymphedema 02/28/2016  . Essential hypertension 02/28/2016  . Type 2 diabetes mellitus (HCC) 02/28/2016   Past Medical History:  Past Medical History:  Diagnosis Date  . ESRD (end stage renal disease) on dialysis (HCC)    "[facility] [name]" (06/22/2016)  . History of gout   . Hyperlipidemia   . Hypertension   . Peripheral vascular disease (HCC)   . Stroke (HCC) 06/20/2016   denies residual on 06/22/2016  . Type II diabetes mellitus (HCC)    Past Surgical History:  Past Surgical History:  Procedure Laterality Date  . EXTERIORIZATION OF A CONTINUOUS AMBULATORY PERITONEAL DIALYSIS CATHETER    . FRACTURE SURGERY    . PERIPHERAL VASCULAR CATHETERIZATION N/A 03/08/2016   Procedure: Dialysis/Perma Catheter Insertion;  Surgeon: Justin Dills, MD;  Location: [facility] INVASIVE CV LAB;  Service: Cardiovascular;  Laterality: N/A;  . PERIPHERAL VASCULAR CATHETERIZATION N/A 04/20/2016   Procedure: Dialysis/Perma Catheter Removal;  Surgeon: Justin Needy, MD;  Location: [facility] INVASIVE CV LAB;  Service: Cardiovascular;  Laterality: N/A;  . PERIPHERAL VASCULAR CATHETERIZATION N/A 05/25/2016   Procedure: Dialysis/Perma Catheter Insertion;  Surgeon: Justin Needy, MD;   Location: [facility] INVASIVE CV LAB;  Service: Cardiovascular;  Laterality: N/A;  . WRIST FRACTURE SURGERY Right ~ 2000   "got 8 screws in there"   HPI:      Assessment / Plan / Recommendation Clinical Impression  The Montreal Cognitive Assessment (MoCA) was administered. Pt scored 26/30, which falls within normal limits for this evaluation and pt level of education (high school graduate). Points were lost on the executive function subtest, delayed recall (recalled 3/5 words), and verbal fluency (named <13 concrete category members in a minute).   Pt was encouraged to notify his physician if he returns to his normal routine and difficulties arise, as outpatient therapy may be beneficial. No further ST intervention recommended at this time.     SLP Assessment  All further Speech Lanaguage Pathology  needs can be addressed in the next venue of care (if needs arise.)    Follow Up Recommendations  None (unless difficulty presents once pt returns to normal routine)    Frequency and Duration   n/a        SLP Evaluation Cognition  Overall Cognitive Status: Within Functional Limits for tasks assessed Arousal/Alertness: Awake/alert Orientation Level: Oriented X4 Attention: Focused;Sustained;Selective Focused Attention: Appears intact Sustained Attention: Appears intact Selective Attention: Appears intact (accurate on attention subtest of MoCA) Memory: Appears intact (recalled 3/5 words after delay) Awareness: Appears intact Problem Solving: Appears intact Executive Function: Reasoning;Sequencing;Organizing Reasoning: Appears intact Sequencing:  (difficulty with visuoperception subtest of MoCA) Organizing: Appears intact       Comprehension  Auditory Comprehension Overall Auditory Comprehension: Appears within functional limits for tasks assessed    Expression Expression Primary Mode of Expression: Verbal Verbal Expression  Overall Verbal Expression: Appears within functional limits  for tasks assessed Written Expression Dominant Hand: Right   Oral / Motor  Oral Motor/Sensory Function Overall Oral Motor/Sensory Function: Within functional limits Motor Speech Overall Motor Speech: Appears within functional limits for tasks assessed   GO                   Justin Fitzgerald B. Justin Fitzgerald [facility], CCC-SLP [phone]  Justin Fitzgerald 06/23/2016, 12:21 PM

## 2016-06-23 NOTE — Progress Notes (Signed)
Progress Note  Patient Name: Justin Fitzgerald Date of Encounter: 06/23/2016  Primary Cardiologist: New Dr. Elease Hashimoto  Subjective   No chest pain and no SOB  Inpatient Medications    Scheduled Meds: . [START ON 06/24/2016] aspirin EC  81 mg Oral Daily  . atorvastatin  80 mg Oral q1800  . budesonide (PULMICORT) nebulizer solution  0.25 mg Nebulization BID  . cinacalcet  120 mg Oral Q supper  . clopidogrel  75 mg Oral Daily  . gabapentin  300 mg Oral QHS  . heparin subcutaneous  5,000 Units Subcutaneous Q8H  . insulin aspart  0-5 Units Subcutaneous QHS  . insulin aspart  0-9 Units Subcutaneous TID WC  . sevelamer carbonate  2.4 g Oral TID WC  . sodium chloride flush  3 mL Intravenous Q12H  . tiotropium  18 mcg Inhalation Daily   Continuous Infusions: . sodium chloride 10 mL/hr at 06/23/16 0643   PRN Meds: sodium chloride, sodium chloride, sodium chloride, albuterol, alteplase, heparin, heparin, lidocaine (PF), lidocaine-prilocaine, pentafluoroprop-tetrafluoroeth, sodium chloride flush   Vital Signs    Vitals:   06/23/16 0500 06/23/16 0832 06/23/16 1014 06/23/16 1418  BP: (!) 107/56  91/67 (!) 100/53  Pulse: 100  88 92  Resp: 18  20 18   Temp: 98.6 F (37 C)  98 F (36.7 C) 98.9 F (37.2 C)  TempSrc: Oral  Oral Oral  SpO2: 100% 97% 100% 99%  Weight:      Height:        Intake/Output Summary (Last 24 hours) at 06/23/16 1430 Last data filed at 06/23/16 1200  Gross per 24 hour  Intake              120 ml  Output             3000 ml  Net            -2880 ml   Filed Weights   06/21/16 1205 06/22/16 1320 06/22/16 1730  Weight: (!) 320 lb 5.3 oz (145.3 kg) (!) 313 lb 15 oz (142.4 kg) (!) 306 lb (138.8 kg)    Telemetry    SR to ST - Personally Reviewed  ECG    No new EKGs - Personally Reviewed  Physical Exam   GEN: No acute distress.   Neck: No JVD Cardiac: RRR, no murmurs, rubs, or gallops.  Respiratory: Clear to auscultation bilaterally. GI: Soft,  nontender, non-distended  MS: rt leg with tightness of edema; No deformity. Neuro:  Nonfocal  Psych: Normal affect   Labs    Chemistry Recent Labs Lab 06/21/16 1205 06/21/16 1223 06/22/16 1422 06/23/16 0722  NA 137 137 137 137  K 3.6 3.4* 3.7 3.9  CL 99* 98* 96* 99*  CO2 25  --  26 26  GLUCOSE 98 99 106* 90  BUN 24* 25* 32* 19  CREATININE 6.68* 7.00* 8.06* 5.91*  CALCIUM 8.9  --  8.7* 9.1  PROT 6.7  --   --   --   ALBUMIN 2.9*  --  2.7* 3.1*  AST 19  --   --   --   ALT 14*  --   --   --   ALKPHOS 101  --   --   --   BILITOT 0.4  --   --   --   GFRNONAA 8*  --  7* 10*  GFRAA 10*  --  8* 11*  ANIONGAP 13  --  15 12  Hematology Recent Labs Lab 06/21/16 1205 06/21/16 1223 06/22/16 1422 06/23/16 0025  WBC 7.3  --  7.2 7.7  RBC 3.35*  --  3.38* 3.44*  HGB 9.4* 9.9* 9.4* 9.7*  HCT 30.8* 29.0* 30.8* 31.4*  MCV 91.9  --  91.1 91.3  MCH 28.1  --  27.8 28.2  MCHC 30.5  --  30.5 30.9  RDW 17.5*  --  17.1* 17.0*  PLT 246  --  251 191    Cardiac EnzymesNo results for input(s): TROPONINI in the last 168 hours.  Recent Labs Lab 06/21/16 1221  TROPIPOC 0.07     BNPNo results for input(s): BNP, PROBNP in the last 168 hours.   DDimer No results for input(s): DDIMER in the last 168 hours.   Radiology    Mr Maxine Glenn Head Wo Contrast  Result Date: 06/21/2016 CLINICAL DATA:  LEFT facial droop and slurred speech beginning this morning. LEFT hand weakness. Follow-up code stroke. History of end-stage on dialysis, hypertension, hyperlipidemia and diabetes. EXAM: MR HEAD WITHOUT CONTRAST MR CIRCLE OF WILLIS WITHOUT CONTRAST MRA OF THE NECK WITHOUT CONTRAST TECHNIQUE: Multiplanar, multiecho pulse sequences of the brain, circle of Willis and surrounding structures were obtained without intravenous contrast. Angiographic images of the neck were obtained using MRA technique without intravenous contrast. COMPARISON:  CT HEAD June 21, 2016 at 1141 hours FINDINGS: MR HEAD FINDINGS-  multiple sequences are mildly motion degraded. BRAIN: Subcentimeter focus of reduced diffusion RIGHT frontal and parietal lobes with low ADC values. Patchy reduced diffusion RIGHT basal ganglia and insula with normalizing ADC values in intermediate to bright FLAIR T2 hyperintense signal. No susceptibility artifact to suggest hemorrhage. The ventricles and sulci are normal for patient's age. Greater than expected scattered subcentimeter supratentorial white matter FLAIR T2 hyperintensities, exclusive of the aforementioned abnormality. No mass effect. No abnormal extra-axial fluid collections. VASCULAR: Normal major intracranial vascular flow voids present at skull base. SKULL AND UPPER CERVICAL SPINE: No abnormal sellar expansion. No suspicious calvarial bone marrow signal. Craniocervical junction maintained. SINUSES/ORBITS: Mild paranasal sinus mucosal thickening. Mastoid air cells are well aerated. The included ocular globes and orbital contents are non-suspicious. OTHER: None. MR CIRCLE OF WILLIS FINDINGS- moderately motion degraded examination . ANTERIOR CIRCULATION: Normal flow related enhancement of the included cervical, petrous, cavernous and supraclinoid internal carotid arteries. Patent anterior communicating artery. Flow related enhancement of the anterior and middle cerebral arteries, including distal segments. Moderate stenosis distal RIGHT M1 and severe RIGHT M2 origins. Moderate stenosis proximal LEFT M2 origin, superior division. No large vessel occlusion, abnormal luminal irregularity, aneurysm. POSTERIOR CIRCULATION: LEFT vertebral artery is dominant. Basilar artery is patent, with normal flow related enhancement of the main branch vessels. Mild luminal irregularity bilateral vertebral arteries compatible with atherosclerosis. Normal flow related enhancement of the posterior cerebral arteries. No large vessel occlusion, high-grade stenosis, abnormal luminal irregularity, aneurysm. ANATOMIC VARIANTS:  None. MRA NECK FINDINGS- limited noncontrast, moderately motion degraded examination. ANTERIOR CIRCULATION: The common carotid arteries are widely patent bilaterally origins not imaged due to time-of-flight technique. The carotid bifurcation is patent bilaterally and there is no hemodynamically significant carotid stenosis by NASCET criteria. No evidence for atherosclerosis or flow limiting stenosis of the cervical internal carotid arteries. POSTERIOR CIRCULATION: Bilateral vertebral arteries are patent to the vertebrobasilar junction, origins not imaged. No evidence for atherosclerosis or flow limiting stenosis. Source images and MIP image were reviewed. IMPRESSION: MRI HEAD: Mildly motion degraded examination. Multifocal acute on subacute RIGHT MCA territory infarcts including RIGHT basal ganglia. Mild to moderate  white matter changes compatible chronic small vessel ischemic disease. MRA HEAD: Moderately motion degraded examination. No emergent large vessel occlusion. Severe stenosis RIGHT M2 origin. Moderate stenosis RIGHT M1 and LEFT M2 origin. MRA NECK: Negative noncontrast moderately motion degraded examination. Electronically Signed   By: Awilda Metro M.D.   On: 06/21/2016 21:18   Mr Maxine Glenn Neck Wo Contrast  Result Date: 06/21/2016 CLINICAL DATA:  LEFT facial droop and slurred speech beginning this morning. LEFT hand weakness. Follow-up code stroke. History of end-stage on dialysis, hypertension, hyperlipidemia and diabetes. EXAM: MR HEAD WITHOUT CONTRAST MR CIRCLE OF WILLIS WITHOUT CONTRAST MRA OF THE NECK WITHOUT CONTRAST TECHNIQUE: Multiplanar, multiecho pulse sequences of the brain, circle of Willis and surrounding structures were obtained without intravenous contrast. Angiographic images of the neck were obtained using MRA technique without intravenous contrast. COMPARISON:  CT HEAD June 21, 2016 at 1141 hours FINDINGS: MR HEAD FINDINGS- multiple sequences are mildly motion degraded. BRAIN:  Subcentimeter focus of reduced diffusion RIGHT frontal and parietal lobes with low ADC values. Patchy reduced diffusion RIGHT basal ganglia and insula with normalizing ADC values in intermediate to bright FLAIR T2 hyperintense signal. No susceptibility artifact to suggest hemorrhage. The ventricles and sulci are normal for patient's age. Greater than expected scattered subcentimeter supratentorial white matter FLAIR T2 hyperintensities, exclusive of the aforementioned abnormality. No mass effect. No abnormal extra-axial fluid collections. VASCULAR: Normal major intracranial vascular flow voids present at skull base. SKULL AND UPPER CERVICAL SPINE: No abnormal sellar expansion. No suspicious calvarial bone marrow signal. Craniocervical junction maintained. SINUSES/ORBITS: Mild paranasal sinus mucosal thickening. Mastoid air cells are well aerated. The included ocular globes and orbital contents are non-suspicious. OTHER: None. MR CIRCLE OF WILLIS FINDINGS- moderately motion degraded examination . ANTERIOR CIRCULATION: Normal flow related enhancement of the included cervical, petrous, cavernous and supraclinoid internal carotid arteries. Patent anterior communicating artery. Flow related enhancement of the anterior and middle cerebral arteries, including distal segments. Moderate stenosis distal RIGHT M1 and severe RIGHT M2 origins. Moderate stenosis proximal LEFT M2 origin, superior division. No large vessel occlusion, abnormal luminal irregularity, aneurysm. POSTERIOR CIRCULATION: LEFT vertebral artery is dominant. Basilar artery is patent, with normal flow related enhancement of the main branch vessels. Mild luminal irregularity bilateral vertebral arteries compatible with atherosclerosis. Normal flow related enhancement of the posterior cerebral arteries. No large vessel occlusion, high-grade stenosis, abnormal luminal irregularity, aneurysm. ANATOMIC VARIANTS: None. MRA NECK FINDINGS- limited noncontrast,  moderately motion degraded examination. ANTERIOR CIRCULATION: The common carotid arteries are widely patent bilaterally origins not imaged due to time-of-flight technique. The carotid bifurcation is patent bilaterally and there is no hemodynamically significant carotid stenosis by NASCET criteria. No evidence for atherosclerosis or flow limiting stenosis of the cervical internal carotid arteries. POSTERIOR CIRCULATION: Bilateral vertebral arteries are patent to the vertebrobasilar junction, origins not imaged. No evidence for atherosclerosis or flow limiting stenosis. Source images and MIP image were reviewed. IMPRESSION: MRI HEAD: Mildly motion degraded examination. Multifocal acute on subacute RIGHT MCA territory infarcts including RIGHT basal ganglia. Mild to moderate white matter changes compatible chronic small vessel ischemic disease. MRA HEAD: Moderately motion degraded examination. No emergent large vessel occlusion. Severe stenosis RIGHT M2 origin. Moderate stenosis RIGHT M1 and LEFT M2 origin. MRA NECK: Negative noncontrast moderately motion degraded examination. Electronically Signed   By: Awilda Metro M.D.   On: 06/21/2016 21:18   Mr Brain Wo Contrast  Result Date: 06/21/2016 CLINICAL DATA:  LEFT facial droop and slurred speech beginning this morning. LEFT hand weakness.  Follow-up code stroke. History of end-stage on dialysis, hypertension, hyperlipidemia and diabetes. EXAM: MR HEAD WITHOUT CONTRAST MR CIRCLE OF WILLIS WITHOUT CONTRAST MRA OF THE NECK WITHOUT CONTRAST TECHNIQUE: Multiplanar, multiecho pulse sequences of the brain, circle of Willis and surrounding structures were obtained without intravenous contrast. Angiographic images of the neck were obtained using MRA technique without intravenous contrast. COMPARISON:  CT HEAD June 21, 2016 at 1141 hours FINDINGS: MR HEAD FINDINGS- multiple sequences are mildly motion degraded. BRAIN: Subcentimeter focus of reduced diffusion RIGHT frontal  and parietal lobes with low ADC values. Patchy reduced diffusion RIGHT basal ganglia and insula with normalizing ADC values in intermediate to bright FLAIR T2 hyperintense signal. No susceptibility artifact to suggest hemorrhage. The ventricles and sulci are normal for patient's age. Greater than expected scattered subcentimeter supratentorial white matter FLAIR T2 hyperintensities, exclusive of the aforementioned abnormality. No mass effect. No abnormal extra-axial fluid collections. VASCULAR: Normal major intracranial vascular flow voids present at skull base. SKULL AND UPPER CERVICAL SPINE: No abnormal sellar expansion. No suspicious calvarial bone marrow signal. Craniocervical junction maintained. SINUSES/ORBITS: Mild paranasal sinus mucosal thickening. Mastoid air cells are well aerated. The included ocular globes and orbital contents are non-suspicious. OTHER: None. MR CIRCLE OF WILLIS FINDINGS- moderately motion degraded examination . ANTERIOR CIRCULATION: Normal flow related enhancement of the included cervical, petrous, cavernous and supraclinoid internal carotid arteries. Patent anterior communicating artery. Flow related enhancement of the anterior and middle cerebral arteries, including distal segments. Moderate stenosis distal RIGHT M1 and severe RIGHT M2 origins. Moderate stenosis proximal LEFT M2 origin, superior division. No large vessel occlusion, abnormal luminal irregularity, aneurysm. POSTERIOR CIRCULATION: LEFT vertebral artery is dominant. Basilar artery is patent, with normal flow related enhancement of the main branch vessels. Mild luminal irregularity bilateral vertebral arteries compatible with atherosclerosis. Normal flow related enhancement of the posterior cerebral arteries. No large vessel occlusion, high-grade stenosis, abnormal luminal irregularity, aneurysm. ANATOMIC VARIANTS: None. MRA NECK FINDINGS- limited noncontrast, moderately motion degraded examination. ANTERIOR CIRCULATION:  The common carotid arteries are widely patent bilaterally origins not imaged due to time-of-flight technique. The carotid bifurcation is patent bilaterally and there is no hemodynamically significant carotid stenosis by NASCET criteria. No evidence for atherosclerosis or flow limiting stenosis of the cervical internal carotid arteries. POSTERIOR CIRCULATION: Bilateral vertebral arteries are patent to the vertebrobasilar junction, origins not imaged. No evidence for atherosclerosis or flow limiting stenosis. Source images and MIP image were reviewed. IMPRESSION: MRI HEAD: Mildly motion degraded examination. Multifocal acute on subacute RIGHT MCA territory infarcts including RIGHT basal ganglia. Mild to moderate white matter changes compatible chronic small vessel ischemic disease. MRA HEAD: Moderately motion degraded examination. No emergent large vessel occlusion. Severe stenosis RIGHT M2 origin. Moderate stenosis RIGHT M1 and LEFT M2 origin. MRA NECK: Negative noncontrast moderately motion degraded examination. Electronically Signed   By: Awilda Metro M.D.   On: 06/21/2016 21:18    Cardiac Studies   Echo 06/22/16 Study Conclusions  - Left ventricle: The cavity size was moderately dilated. Wall   thickness was increased in a pattern of moderate LVH. Systolic   function was severely reduced. The estimated ejection fraction   was in the range of 20% to 25%. Diffuse hypokinesis. Regional   wall motion abnormalities cannot be excluded. Doppler parameters   are consistent with abnormal left ventricular relaxation (grade 1   diastolic dysfunction). - Aortic valve: Valve mobility was mildly restricted. Transvalvular   velocity was increased. There was mild stenosis. There was mild   regurgitation.  Valve area (VTI): 1.19 cm^2. Valve area (Vmax):   1.21 cm^2. Valve area (Vmean): 1.09 cm^2. - Aortic root: The aortic root was mildly dilated. - Mitral valve: Mildly to moderately calcified annulus. -  Left atrium: The atrium was moderately dilated.   Patient Profile     56 y.o. male with a PMHx of ESRD on HD , HTN, PVD , DM  who was admitted to [facility] on 06/21/2016 for evaluation of  CVA .  Has chronic combined systolic and diastolic HF.  For cardiac cath today.    Assessment & Plan    1. Chronic combined systolic and diastolic congestive heart failure: The patient was incidentally noted have congestive heart failure on echocardiogram. He really does not have any symptoms of CHF but does have some mild shortness breath with exertion. He denies orthopnea and PND. He denies any chest pain.  --for cardiac cath today.  Possible for inf. Wall MI previously Rt and Lt heart cath.  Pt somewhat stressed he has not had cath yet, reassured and explained he is next procedure.   --wt is down from 320 to 305   2. Tobacco use- advised to stop  3.  Cardiomyopathy for cath today to eval CAD as cause.   4. ESRD on HD   Signed, Nada Boozer, NP  06/23/2016, 2:30 PM    Attending Note:   The patient was seen and examined.  Agree with assessment and plan as noted above.  Changes made to the above note as needed.  Patient seen and independently examined with Nada Boozer, NP .   We discussed all aspects of the encounter. I agree with the assessment and plan as stated above.  1.  ? Chronic systolic CHF:   His echo incidentally revealed  severe dysfunction.  Will get a cath to look for CAD He has a long hx of HTN and this could be a result of longstanding HTN. Continue CHF meds.   2. HTN:  Continue meds.   3. Obesity:   Needs to lose weight    I have spent a total of 40 minutes with patient reviewing hospital  notes , telemetry, EKGs, labs and examining patient as well as establishing an assessment and plan that was discussed with the patient. > 50% of time was spent in direct patient care.     Vesta Mixer, Montez Hageman., MD, [facility] 06/23/2016, 4:12 PM [address],  [address] Office [phone] Pager [phone]

## 2016-06-23 NOTE — Progress Notes (Signed)
STROKE TEAM PROGRESS NOTE   SUBJECTIVE (INTERVAL HISTORY) No new complaints. Tolerated IV heparin overnight. Changed back to aspirin and plavix this am. For cardiac cath today and TEE monday  OBJECTIVE Temp:  [97.5 F (36.4 C)-98.6 F (37 C)] 98.6 F (37 C) (02/09 0500) Pulse Rate:  [73-100] 100 (02/09 0500) Cardiac Rhythm: Normal sinus rhythm (02/09 0700) Resp:  [18-21] 18 (02/09 0500) BP: (91-108)/(54-87) 91/67 (02/09 1014) SpO2:  [97 %-100 %] 97 % (02/09 0832) Weight:  [138.8 kg (306 lb)] 138.8 kg (306 lb) (02/08 1730)  PHYSICAL EXAM Obese middle-aged [place] American male   . Not in distress.2 plus pedal edema bilaterally.  . Afebrile. Head is nontraumatic. Neck is supple without bruit.    Cardiac exam no murmur or gallop. Lungs are clear to auscultation. Distal pulses are well felt. Neurological Exam :  Awake alert oriented x 3 normal speech and language.No face asymmetry. Tongue midline. No drift. Mild diminished fine finger movements on left. Orbits right over left upper extremity. Mild left grip weak.. Normal sensation . Normal coordination.  ASSESSMENT/PLAN Justin Fitzgerald is a 56 y.o. male with history of end-stage renal disease on HD, HTN, diabetes, and peripheral vascular disease presenting with left facial droop and slurred speech. He did not receive IV t-PA due to delay in arrival.   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 Right posterior insula and lentiform nucleus suggesting acute ischemia. Chronic bilateral maxillary disease. Aspects = 8   MRI  multifocal acute on subacute right MCA territory infarcts. Small vessel disease  MRA head  no LVO. Severe stenosis R M2 origin. Moderate stenosis R M1 and L M2 origin  MRA neck negative  2D Echo  EF 20-25% with diffuse hypokinesis  LE venous dopplers no DVT  TEE to look for embolic source planned for Monday   LDL  98  HgbA1c 5.0  Heparin 5000 units sq tid for VTE prophylaxis Diet NPO time specified Except for: Sips with Meds  clopidogrel 75 mg daily prior to admission, now on aspirin 81 mg daily and clopidogrel 75 mg daily. From stroke standpoint, there is no indication for Anticoagulation without identified embolic source.    Therapy recommendations:  No therapy needs  Disposition:  Return home  Hypertension  Stable Permissive hypertension (OK if < 220/120) but gradually normalize in 5-7 days Long-term BP goal normotensive  Hyperlipidemia  Home meds:  No statin  LDL goal < 70  Now on Lipitor 80 mg daily   Continue statin at discharge  Diabetes  HgbA1c goal < 7.0  Other Stroke Risk Factors  Cigarette smoker  He uses smokeless tobacco   Urine drug screen negative  Morbid Obesity, Body mass index is 41.5 kg/m.  Peripheral vascular disease  Combined systolic and diastolic congestive heart failure, EF 20-25% - further evaluation of etiology by cardiac cath today, hypertension versus prior infarct  Other Active Problems  ESRD on HD  Anemia   COPD  Hospital day # 2  Justin Fitzgerald [facility] See Amion for Pager information 06/23/2016 1:51 PM  I have personally examined this patient, reviewed notes, independently viewed imaging studies, participated in medical decision making and plan of care.ROS completed by me personally and pertinent positives fully documented  I have made any additions or clarifications directly to the above note. Agree with note above. [name] Await TEE on Monday and cardiac catheterization today. Decision on  long-term anticoagulation only if definite cardiac source of embolism found. Delia Heady, MD Medical Director [facility] Pager: [phone] 06/23/2016 2:17 PM  To contact Stroke Continuity provider, please refer to [email or web address]. After hours, contact General Neurology

## 2016-06-23 NOTE — Progress Notes (Signed)
**  Preliminary report by tech**  Bilateral lower extremity venous duplex completed. There is no obvious evidence of deep or superficial vein thrombosis involving the right and left lower extremities. All clearly visualized vessels appear patent and compressible. There is no evidence of Baker's cysts bilaterally.  06/23/16 11:23 AM Olen Cordial RVT

## 2016-06-23 NOTE — Progress Notes (Signed)
ANTICOAGULATION CONSULT NOTE - Follow Up Consult  Pharmacy Consult for heparin Indication: embolic MCA infarcts  Labs:  Recent Labs  06/21/16 1205 06/21/16 1223 06/22/16 1422 06/23/16 0025  HGB 9.4* 9.9* 9.4* 9.7*  HCT 30.8* 29.0* 30.8* 31.4*  PLT 246  --  251 191  APTT 32  --   --   --   LABPROT 12.9  --   --   --   INR 0.97  --   --   --   HEPARINUNFRC  --   --   --  0.13*  CREATININE 6.68* 7.00* 8.06*  --     Assessment: 55yo male subtherapeutic on heparin with initial dosing for MCA embolic infarcts w/ unknown source; pt planned for cath today but not yet on schedule.  Goal of Therapy:  Heparin level 0.3-0.5 units/ml   Plan:  Will increase heparin gtt by 1-2 units/kg/hr to 1400 units/hr and check level in 8hr if heparin not stopped for cath.  Vernard Gambles, PharmD, BCPS  06/23/2016,1:43 AM

## 2016-06-23 NOTE — Progress Notes (Signed)
OT Cancellation Note  Patient Details Name: Justin Fitzgerald MRN: [id number] DOB: [birth year]   Cancelled Treatment:    Reason Eval/Treat Not Completed: Other (comment). Per nurse pt currently with low blood pressure concerning for working with therapy. Also, noted pt started initial dose of Heparin yesterday at 5pm.   Raynald Kemp OTR/L Pager: [phone]   06/23/2016, 9:53 AM

## 2016-06-23 NOTE — Progress Notes (Signed)
Internal Medicine Attending  Date: 06/23/2016  Patient name: Justin Fitzgerald Medical record number: [id number] Date of birth: [birth year] Age: 56 y.o. Gender: male  I saw and evaluated the patient. I reviewed the resident's note by Dr. Ladona Ridgel and I agree with the resident's findings and plans as documented in his progress note.  Mr. Langlais was without specific complaints when seen on rounds this morning. Physical exam revealed no evidence of left facial droop. We discussed the reasoning for the cardiac catheterization. The reaction he had to ramapril in the past was severe shortness of breath. He was unable to remember if he had throat, tongue, or lip swelling during this event. If blood pressure could tolerate therapy in the future for his cardiomyopathy a beta blocker may be preferred over even an ARB given this concerning history after taking ramipril. Neurology is concerned about a cardioembolic source although they feel the risks of anticoagulation with IV heparin could result in hemorrhagic transformation of the ischemic infarcts already in place, and therefore recommend stopping the heparin. We will continue with the aspirin therapy given our concerns that he may have had a ruptured plaque at the bifurcation where the severe stenosis was seen on MRA. Obviously, if TEE shows a cardioembolic source he would be a candidate for full anticoagulation at that time. We will observe him over the weekend until the TEE can be completed on Monday. We will also follow-up on the results of the cardiac catheterization performed today.

## 2016-06-23 NOTE — Interval H&P Note (Signed)
History and Physical Interval Note:  06/23/2016 4:26 PM  Justin Fitzgerald  has presented today for surgery, with the diagnosis of unstable angina  The various methods of treatment have been discussed with the patient and family. After consideration of risks, benefits and other options for treatment, the patient has consented to  Procedure(s): Right/Left Heart Cath and Coronary Angiography (N/A) as a surgical intervention .  The patient's history has been reviewed, patient examined, no change in status, stable for surgery.  I have reviewed the patient's chart and labs.  Questions were answered to the patient's satisfaction.     [name]

## 2016-06-23 NOTE — Progress Notes (Signed)
Patient transported to cath lab

## 2016-06-23 NOTE — Progress Notes (Signed)
Site area: Right groin a 5 french arterial and 7 french venous sheath was removed  Site Prior to Removal:  Level 0  Pressure Applied For 20 MINUTES    Bedrest Beginning at 1750p  Manual:   Yes.    Patient Status During Pull:  stable  Post Pull Groin Site:  Level 0  Post Pull Instructions Given:  Yes.    Post Pull Pulses Present:  Yes.    Dressing Applied:  Yes.    Comments:  VS remain stable during sheath pull

## 2016-06-23 NOTE — Progress Notes (Signed)
MD notified of blood pressure of 91/67, no neuro changes, pt asymptomatic, states his blood pressure is always low. No new orders, will notify md if neuro status changes occur

## 2016-06-23 NOTE — H&P (View-Only) (Signed)
Progress Note  Patient Name: Riaan Toledo Date of Encounter: 06/23/2016  Primary Cardiologist: New Dr. Elease Hashimoto  Subjective   No chest pain and no SOB  Inpatient Medications    Scheduled Meds: . [START ON 06/24/2016] aspirin EC  81 mg Oral Daily  . atorvastatin  80 mg Oral q1800  . budesonide (PULMICORT) nebulizer solution  0.25 mg Nebulization BID  . cinacalcet  120 mg Oral Q supper  . clopidogrel  75 mg Oral Daily  . gabapentin  300 mg Oral QHS  . heparin subcutaneous  5,000 Units Subcutaneous Q8H  . insulin aspart  0-5 Units Subcutaneous QHS  . insulin aspart  0-9 Units Subcutaneous TID WC  . sevelamer carbonate  2.4 g Oral TID WC  . sodium chloride flush  3 mL Intravenous Q12H  . tiotropium  18 mcg Inhalation Daily   Continuous Infusions: . sodium chloride 10 mL/hr at 06/23/16 0643   PRN Meds: sodium chloride, sodium chloride, sodium chloride, albuterol, alteplase, heparin, heparin, lidocaine (PF), lidocaine-prilocaine, pentafluoroprop-tetrafluoroeth, sodium chloride flush   Vital Signs    Vitals:   06/23/16 0500 06/23/16 0832 06/23/16 1014 06/23/16 1418  BP: (!) 107/56  91/67 (!) 100/53  Pulse: 100  88 92  Resp: 18  20 18   Temp: 98.6 F (37 C)  98 F (36.7 C) 98.9 F (37.2 C)  TempSrc: Oral  Oral Oral  SpO2: 100% 97% 100% 99%  Weight:      Height:        Intake/Output Summary (Last 24 hours) at 06/23/16 1430 Last data filed at 06/23/16 1200  Gross per 24 hour  Intake              120 ml  Output             3000 ml  Net            -2880 ml   Filed Weights   06/21/16 1205 06/22/16 1320 06/22/16 1730  Weight: (!) 320 lb 5.3 oz (145.3 kg) (!) 313 lb 15 oz (142.4 kg) (!) 306 lb (138.8 kg)    Telemetry    SR to ST - Personally Reviewed  ECG    No new EKGs - Personally Reviewed  Physical Exam   GEN: No acute distress.   Neck: No JVD Cardiac: RRR, no murmurs, rubs, or gallops.  Respiratory: Clear to auscultation bilaterally. GI: Soft,  nontender, non-distended  MS: rt leg with tightness of edema; No deformity. Neuro:  Nonfocal  Psych: Normal affect   Labs    Chemistry Recent Labs Lab 06/21/16 1205 06/21/16 1223 06/22/16 1422 06/23/16 0722  NA 137 137 137 137  K 3.6 3.4* 3.7 3.9  CL 99* 98* 96* 99*  CO2 25  --  26 26  GLUCOSE 98 99 106* 90  BUN 24* 25* 32* 19  CREATININE 6.68* 7.00* 8.06* 5.91*  CALCIUM 8.9  --  8.7* 9.1  PROT 6.7  --   --   --   ALBUMIN 2.9*  --  2.7* 3.1*  AST 19  --   --   --   ALT 14*  --   --   --   ALKPHOS 101  --   --   --   BILITOT 0.4  --   --   --   GFRNONAA 8*  --  7* 10*  GFRAA 10*  --  8* 11*  ANIONGAP 13  --  15 12  Hematology Recent Labs Lab 06/21/16 1205 06/21/16 1223 06/22/16 1422 06/23/16 0025  WBC 7.3  --  7.2 7.7  RBC 3.35*  --  3.38* 3.44*  HGB 9.4* 9.9* 9.4* 9.7*  HCT 30.8* 29.0* 30.8* 31.4*  MCV 91.9  --  91.1 91.3  MCH 28.1  --  27.8 28.2  MCHC 30.5  --  30.5 30.9  RDW 17.5*  --  17.1* 17.0*  PLT 246  --  251 191    Cardiac EnzymesNo results for input(s): TROPONINI in the last 168 hours.  Recent Labs Lab 06/21/16 1221  TROPIPOC 0.07     BNPNo results for input(s): BNP, PROBNP in the last 168 hours.   DDimer No results for input(s): DDIMER in the last 168 hours.   Radiology    Mr Maxine Glenn Head Wo Contrast  Result Date: 06/21/2016 CLINICAL DATA:  LEFT facial droop and slurred speech beginning this morning. LEFT hand weakness. Follow-up code stroke. History of end-stage on dialysis, hypertension, hyperlipidemia and diabetes. EXAM: MR HEAD WITHOUT CONTRAST MR CIRCLE OF WILLIS WITHOUT CONTRAST MRA OF THE NECK WITHOUT CONTRAST TECHNIQUE: Multiplanar, multiecho pulse sequences of the brain, circle of Willis and surrounding structures were obtained without intravenous contrast. Angiographic images of the neck were obtained using MRA technique without intravenous contrast. COMPARISON:  CT HEAD June 21, 2016 at 1141 hours FINDINGS: MR HEAD FINDINGS-  multiple sequences are mildly motion degraded. BRAIN: Subcentimeter focus of reduced diffusion RIGHT frontal and parietal lobes with low ADC values. Patchy reduced diffusion RIGHT basal ganglia and insula with normalizing ADC values in intermediate to bright FLAIR T2 hyperintense signal. No susceptibility artifact to suggest hemorrhage. The ventricles and sulci are normal for patient's age. Greater than expected scattered subcentimeter supratentorial white matter FLAIR T2 hyperintensities, exclusive of the aforementioned abnormality. No mass effect. No abnormal extra-axial fluid collections. VASCULAR: Normal major intracranial vascular flow voids present at skull base. SKULL AND UPPER CERVICAL SPINE: No abnormal sellar expansion. No suspicious calvarial bone marrow signal. Craniocervical junction maintained. SINUSES/ORBITS: Mild paranasal sinus mucosal thickening. Mastoid air cells are well aerated. The included ocular globes and orbital contents are non-suspicious. OTHER: None. MR CIRCLE OF WILLIS FINDINGS- moderately motion degraded examination . ANTERIOR CIRCULATION: Normal flow related enhancement of the included cervical, petrous, cavernous and supraclinoid internal carotid arteries. Patent anterior communicating artery. Flow related enhancement of the anterior and middle cerebral arteries, including distal segments. Moderate stenosis distal RIGHT M1 and severe RIGHT M2 origins. Moderate stenosis proximal LEFT M2 origin, superior division. No large vessel occlusion, abnormal luminal irregularity, aneurysm. POSTERIOR CIRCULATION: LEFT vertebral artery is dominant. Basilar artery is patent, with normal flow related enhancement of the main branch vessels. Mild luminal irregularity bilateral vertebral arteries compatible with atherosclerosis. Normal flow related enhancement of the posterior cerebral arteries. No large vessel occlusion, high-grade stenosis, abnormal luminal irregularity, aneurysm. ANATOMIC VARIANTS:  None. MRA NECK FINDINGS- limited noncontrast, moderately motion degraded examination. ANTERIOR CIRCULATION: The common carotid arteries are widely patent bilaterally origins not imaged due to time-of-flight technique. The carotid bifurcation is patent bilaterally and there is no hemodynamically significant carotid stenosis by NASCET criteria. No evidence for atherosclerosis or flow limiting stenosis of the cervical internal carotid arteries. POSTERIOR CIRCULATION: Bilateral vertebral arteries are patent to the vertebrobasilar junction, origins not imaged. No evidence for atherosclerosis or flow limiting stenosis. Source images and MIP image were reviewed. IMPRESSION: MRI HEAD: Mildly motion degraded examination. Multifocal acute on subacute RIGHT MCA territory infarcts including RIGHT basal ganglia. Mild to moderate  white matter changes compatible chronic small vessel ischemic disease. MRA HEAD: Moderately motion degraded examination. No emergent large vessel occlusion. Severe stenosis RIGHT M2 origin. Moderate stenosis RIGHT M1 and LEFT M2 origin. MRA NECK: Negative noncontrast moderately motion degraded examination. Electronically Signed   By: Awilda Metro M.D.   On: 06/21/2016 21:18   Mr Maxine Glenn Neck Wo Contrast  Result Date: 06/21/2016 CLINICAL DATA:  LEFT facial droop and slurred speech beginning this morning. LEFT hand weakness. Follow-up code stroke. History of end-stage on dialysis, hypertension, hyperlipidemia and diabetes. EXAM: MR HEAD WITHOUT CONTRAST MR CIRCLE OF WILLIS WITHOUT CONTRAST MRA OF THE NECK WITHOUT CONTRAST TECHNIQUE: Multiplanar, multiecho pulse sequences of the brain, circle of Willis and surrounding structures were obtained without intravenous contrast. Angiographic images of the neck were obtained using MRA technique without intravenous contrast. COMPARISON:  CT HEAD June 21, 2016 at 1141 hours FINDINGS: MR HEAD FINDINGS- multiple sequences are mildly motion degraded. BRAIN:  Subcentimeter focus of reduced diffusion RIGHT frontal and parietal lobes with low ADC values. Patchy reduced diffusion RIGHT basal ganglia and insula with normalizing ADC values in intermediate to bright FLAIR T2 hyperintense signal. No susceptibility artifact to suggest hemorrhage. The ventricles and sulci are normal for patient's age. Greater than expected scattered subcentimeter supratentorial white matter FLAIR T2 hyperintensities, exclusive of the aforementioned abnormality. No mass effect. No abnormal extra-axial fluid collections. VASCULAR: Normal major intracranial vascular flow voids present at skull base. SKULL AND UPPER CERVICAL SPINE: No abnormal sellar expansion. No suspicious calvarial bone marrow signal. Craniocervical junction maintained. SINUSES/ORBITS: Mild paranasal sinus mucosal thickening. Mastoid air cells are well aerated. The included ocular globes and orbital contents are non-suspicious. OTHER: None. MR CIRCLE OF WILLIS FINDINGS- moderately motion degraded examination . ANTERIOR CIRCULATION: Normal flow related enhancement of the included cervical, petrous, cavernous and supraclinoid internal carotid arteries. Patent anterior communicating artery. Flow related enhancement of the anterior and middle cerebral arteries, including distal segments. Moderate stenosis distal RIGHT M1 and severe RIGHT M2 origins. Moderate stenosis proximal LEFT M2 origin, superior division. No large vessel occlusion, abnormal luminal irregularity, aneurysm. POSTERIOR CIRCULATION: LEFT vertebral artery is dominant. Basilar artery is patent, with normal flow related enhancement of the main branch vessels. Mild luminal irregularity bilateral vertebral arteries compatible with atherosclerosis. Normal flow related enhancement of the posterior cerebral arteries. No large vessel occlusion, high-grade stenosis, abnormal luminal irregularity, aneurysm. ANATOMIC VARIANTS: None. MRA NECK FINDINGS- limited noncontrast,  moderately motion degraded examination. ANTERIOR CIRCULATION: The common carotid arteries are widely patent bilaterally origins not imaged due to time-of-flight technique. The carotid bifurcation is patent bilaterally and there is no hemodynamically significant carotid stenosis by NASCET criteria. No evidence for atherosclerosis or flow limiting stenosis of the cervical internal carotid arteries. POSTERIOR CIRCULATION: Bilateral vertebral arteries are patent to the vertebrobasilar junction, origins not imaged. No evidence for atherosclerosis or flow limiting stenosis. Source images and MIP image were reviewed. IMPRESSION: MRI HEAD: Mildly motion degraded examination. Multifocal acute on subacute RIGHT MCA territory infarcts including RIGHT basal ganglia. Mild to moderate white matter changes compatible chronic small vessel ischemic disease. MRA HEAD: Moderately motion degraded examination. No emergent large vessel occlusion. Severe stenosis RIGHT M2 origin. Moderate stenosis RIGHT M1 and LEFT M2 origin. MRA NECK: Negative noncontrast moderately motion degraded examination. Electronically Signed   By: Awilda Metro M.D.   On: 06/21/2016 21:18   Mr Brain Wo Contrast  Result Date: 06/21/2016 CLINICAL DATA:  LEFT facial droop and slurred speech beginning this morning. LEFT hand weakness.  Follow-up code stroke. History of end-stage on dialysis, hypertension, hyperlipidemia and diabetes. EXAM: MR HEAD WITHOUT CONTRAST MR CIRCLE OF WILLIS WITHOUT CONTRAST MRA OF THE NECK WITHOUT CONTRAST TECHNIQUE: Multiplanar, multiecho pulse sequences of the brain, circle of Willis and surrounding structures were obtained without intravenous contrast. Angiographic images of the neck were obtained using MRA technique without intravenous contrast. COMPARISON:  CT HEAD June 21, 2016 at 1141 hours FINDINGS: MR HEAD FINDINGS- multiple sequences are mildly motion degraded. BRAIN: Subcentimeter focus of reduced diffusion RIGHT frontal  and parietal lobes with low ADC values. Patchy reduced diffusion RIGHT basal ganglia and insula with normalizing ADC values in intermediate to bright FLAIR T2 hyperintense signal. No susceptibility artifact to suggest hemorrhage. The ventricles and sulci are normal for patient's age. Greater than expected scattered subcentimeter supratentorial white matter FLAIR T2 hyperintensities, exclusive of the aforementioned abnormality. No mass effect. No abnormal extra-axial fluid collections. VASCULAR: Normal major intracranial vascular flow voids present at skull base. SKULL AND UPPER CERVICAL SPINE: No abnormal sellar expansion. No suspicious calvarial bone marrow signal. Craniocervical junction maintained. SINUSES/ORBITS: Mild paranasal sinus mucosal thickening. Mastoid air cells are well aerated. The included ocular globes and orbital contents are non-suspicious. OTHER: None. MR CIRCLE OF WILLIS FINDINGS- moderately motion degraded examination . ANTERIOR CIRCULATION: Normal flow related enhancement of the included cervical, petrous, cavernous and supraclinoid internal carotid arteries. Patent anterior communicating artery. Flow related enhancement of the anterior and middle cerebral arteries, including distal segments. Moderate stenosis distal RIGHT M1 and severe RIGHT M2 origins. Moderate stenosis proximal LEFT M2 origin, superior division. No large vessel occlusion, abnormal luminal irregularity, aneurysm. POSTERIOR CIRCULATION: LEFT vertebral artery is dominant. Basilar artery is patent, with normal flow related enhancement of the main branch vessels. Mild luminal irregularity bilateral vertebral arteries compatible with atherosclerosis. Normal flow related enhancement of the posterior cerebral arteries. No large vessel occlusion, high-grade stenosis, abnormal luminal irregularity, aneurysm. ANATOMIC VARIANTS: None. MRA NECK FINDINGS- limited noncontrast, moderately motion degraded examination. ANTERIOR CIRCULATION:  The common carotid arteries are widely patent bilaterally origins not imaged due to time-of-flight technique. The carotid bifurcation is patent bilaterally and there is no hemodynamically significant carotid stenosis by NASCET criteria. No evidence for atherosclerosis or flow limiting stenosis of the cervical internal carotid arteries. POSTERIOR CIRCULATION: Bilateral vertebral arteries are patent to the vertebrobasilar junction, origins not imaged. No evidence for atherosclerosis or flow limiting stenosis. Source images and MIP image were reviewed. IMPRESSION: MRI HEAD: Mildly motion degraded examination. Multifocal acute on subacute RIGHT MCA territory infarcts including RIGHT basal ganglia. Mild to moderate white matter changes compatible chronic small vessel ischemic disease. MRA HEAD: Moderately motion degraded examination. No emergent large vessel occlusion. Severe stenosis RIGHT M2 origin. Moderate stenosis RIGHT M1 and LEFT M2 origin. MRA NECK: Negative noncontrast moderately motion degraded examination. Electronically Signed   By: Awilda Metro M.D.   On: 06/21/2016 21:18    Cardiac Studies   Echo 06/22/16 Study Conclusions  - Left ventricle: The cavity size was moderately dilated. Wall   thickness was increased in a pattern of moderate LVH. Systolic   function was severely reduced. The estimated ejection fraction   was in the range of 20% to 25%. Diffuse hypokinesis. Regional   wall motion abnormalities cannot be excluded. Doppler parameters   are consistent with abnormal left ventricular relaxation (grade 1   diastolic dysfunction). - Aortic valve: Valve mobility was mildly restricted. Transvalvular   velocity was increased. There was mild stenosis. There was mild   regurgitation.  Valve area (VTI): 1.19 cm^2. Valve area (Vmax):   1.21 cm^2. Valve area (Vmean): 1.09 cm^2. - Aortic root: The aortic root was mildly dilated. - Mitral valve: Mildly to moderately calcified annulus. -  Left atrium: The atrium was moderately dilated.   Patient Profile     56 y.o. male with a PMHx of ESRD on HD , HTN, PVD , DM  who was admitted to [facility] on 06/21/2016 for evaluation of  CVA .  Has chronic combined systolic and diastolic HF.  For cardiac cath today.    Assessment & Plan    1. Chronic combined systolic and diastolic congestive heart failure: The patient was incidentally noted have congestive heart failure on echocardiogram. He really does not have any symptoms of CHF but does have some mild shortness breath with exertion. He denies orthopnea and PND. He denies any chest pain.  --for cardiac cath today.  Possible for inf. Wall MI previously Rt and Lt heart cath.  Pt somewhat stressed he has not had cath yet, reassured and explained he is next procedure.   --wt is down from 320 to 305   2. Tobacco use- advised to stop  3.  Cardiomyopathy for cath today to eval CAD as cause.   4. ESRD on HD   Signed, Nada Boozer, NP  06/23/2016, 2:30 PM    Attending Note:   The patient was seen and examined.  Agree with assessment and plan as noted above.  Changes made to the above note as needed.  Patient seen and independently examined with Nada Boozer, NP .   We discussed all aspects of the encounter. I agree with the assessment and plan as stated above.  1.  ? Chronic systolic CHF:   His echo incidentally revealed  severe dysfunction.  Will get a cath to look for CAD He has a long hx of HTN and this could be a result of longstanding HTN. Continue CHF meds.   2. HTN:  Continue meds.   3. Obesity:   Needs to lose weight    I have spent a total of 40 minutes with patient reviewing hospital  notes , telemetry, EKGs, labs and examining patient as well as establishing an assessment and plan that was discussed with the patient. > 50% of time was spent in direct patient care.     Vesta Mixer, Montez Hageman., MD, [facility] 06/23/2016, 4:12 PM [address],  [address] Office [phone] Pager [phone]

## 2016-06-23 NOTE — Progress Notes (Signed)
Subjective: No acute events overnight. Patient is asymptomatic today. He has no neurological deficits secondary to his prior CVA. He knows he is having a catheterization today had a TEE for Monday. He had several questions regarding these procedures and they were answered thoroughly.  Objective:  Vital signs in last 24 hours: Vitals:   06/23/16 0108 06/23/16 0500 06/23/16 0832 06/23/16 1014  BP: (!) 102/54 (!) 107/56  91/67  Pulse: 98 100    Resp: 18 18    Temp: 98 F (36.7 C) 98.6 F (37 C)    TempSrc: Oral Oral    SpO2: 99% 100% 97%   Weight:      Height:       Physical Exam  Constitutional: He is oriented to person, place, and time. He appears well-developed and well-nourished.  HENT:  Head: Normocephalic and atraumatic.  Cardiovascular: Normal rate and regular rhythm.  Exam reveals no friction rub.   Respiratory: Effort normal and breath sounds normal.  Musculoskeletal: He exhibits edema.  Bilateral lower extremity edema. Nonpitting. Woody feeling.  Neurological: He is alert and oriented to person, place, and time.  No cranial nerve deficits appreciated     Assessment/Plan:  Principal Problem:   CVA (cerebral vascular accident) (HCC) Active Problems:   Essential hypertension   Type 2 diabetes mellitus (HCC)   End stage renal disease (HCC)   Acute ischemic stroke (HCC)   Peripheral vascular disease (HCC)   Chronic combined systolic and diastolic congestive heart failure [facility])   [name] is a 56 year old African-American gentleman with a medical history of end-stage renal disease, hypertension and diabetes who presents with left facial droop upon awakening this morning and a CT scan concerning for acute infarct.   In summary, patient had echocardiogram which shows combined chronic diastolic and systolic congestive heart failure. He was evaluated by cardiology who recommended a cardiac catheterization for further evaluation of potential etiologies of his  heart failure. He will have heart cath today. Additionally, neurology is suspicious for cardiac embolic source of the patient's stroke. He will need to remain in the hospital for a TEE which should be performed on Monday. We will not start full anticoagulation but will continue with antiplatelets as documented in detail below under stroke.   # Stroke There is a concern for possible cardioembolic source given the radiographic evidence of stroke. For now we will continue with antiplatelet therapy and not pursue full therapeutic anticoagulation. The reasoning for this is as follows: Although his rdaiographic findings are suspicious for a potential cardioembolic source his TTE did not show vegetation or clot. Additionally, the patient has several areas of ischemic infarct within his brain and at the current time in the setting of stroke recovery he is at risk for hemorrhagic conversion which would be increased in the setting of therapeutic anticoagulation. At this time we will await for him to have a TEE on Monday and decide on anticoagulation strategies at that time. Again, we think the risks of full anticoagulation outweigh the benefits in the setting of acute infarct with the potential for hemorrhagic conversion and no documented cardioembolic source. For now we will start dual antiplatelet strategy with clopidogrel and aspirin. -- Atorvastatin 80 once daily -- Permissive hypertension with a goal of 220/110 -- Clopidogrel 75 mg + Aspirin 81mg  -- TEE on Monday  # ESRD on HD TTS ## HTN- labile with several episodes of hypotension Patient with end-stage renal disease on hemodialysis. Access via right IJ tunneled catheter. Patient also  with a history of hypertension. In the setting of acute infarct we will hold antihypertensive medications and allow for permissive hypertension with a theoretical benefit of perfusing the penumbra. Also, the patient has had several episodes of hypotension documented. His most  recent blood pressure was also low but the patient was asymptomatic. For now, his blood pressures are largely being controlled with the amount of volume taken off of dialysis. Adjustments may need to be made to allow the patient to start additional cardiac medications such as a beta blocker or an ACE inhibitor for maximal medical management. -- Continue current dialysis schedule- will have dialysis today -- Blood pressure goal of 220/110 -- Home meds that are held include Metolazone, furosemide, metoprolol   # Combined systolic and diastolic congestive heart failure As part of the patient's stroke workup he had an echocardiogram which showed a left ventricular ejection fraction in the range of 20-25%. Grade 1 diastolic dysfunction was also noted. This consistent with a combined systolic and diastolic congestive heart failure picture. The exact etiology of the patient's heart failure is unclear although it may be from uncontrolled hypertension or prior infarct. He was seen by cardiology and will have a cardiac catheterization today for further evaluation. -- Catheterization today -- Appreciate cardiology recommendations  # DM -- Sliding scale insulin -- Hemoglobin A1c  # Anemia Patient has anemia of chronic kidney disease. Most recent hemoglobin of 9.7. -- EPO outpatient  ? COPD Patient with tiotropium listed as a medication. Prior hospitalization for potential COPD exacerbation. No PFTs on file -- Spiriva -- Albuterol nebulizer q6 hrs prn  DVT/PE prophylaxis:  heparin FEN/GI: Nothing by mouth pending speech evaluation Code: Full code   Dispo: Anticipated discharge today pending stroke workup.  Thomasene Lot, MD 06/23/2016, 10:47 AM Pager: [phone]

## 2016-06-24 DIAGNOSIS — N185 Chronic kidney disease, stage 5: Secondary | ICD-10-CM

## 2016-06-24 DIAGNOSIS — N186 End stage renal disease: Secondary | ICD-10-CM

## 2016-06-24 DIAGNOSIS — E1122 Type 2 diabetes mellitus with diabetic chronic kidney disease: Secondary | ICD-10-CM

## 2016-06-24 LAB — RENAL FUNCTION PANEL
Albumin: 2.7 g/dL — ABNORMAL LOW (ref 3.5–5.0)
Anion gap: 11 (ref 5–15)
BUN: 25 mg/dL — AB (ref 6–20)
CO2: 26 mmol/L (ref 22–32)
Calcium: 8.4 mg/dL — ABNORMAL LOW (ref 8.9–10.3)
Chloride: 97 mmol/L — ABNORMAL LOW (ref 101–111)
Creatinine, Ser: 7.43 mg/dL — ABNORMAL HIGH (ref 0.61–1.24)
GFR calc Af Amer: 8 mL/min — ABNORMAL LOW (ref >60)
GFR calc non Af Amer: 7 mL/min — ABNORMAL LOW (ref >60)
GLUCOSE: 115 mg/dL — AB (ref 65–99)
PHOSPHORUS: 5.8 mg/dL — AB (ref 2.5–4.6)
POTASSIUM: 3.8 mmol/L (ref 3.5–5.1)
Sodium: 134 mmol/L — ABNORMAL LOW (ref 135–145)

## 2016-06-24 LAB — HIV ANTIBODY (ROUTINE TESTING W REFLEX): HIV SCREEN 4TH GENERATION: NONREACTIVE

## 2016-06-24 LAB — GLUCOSE, CAPILLARY
Glucose-Capillary: 126 mg/dL — ABNORMAL HIGH (ref 65–99)
Glucose-Capillary: 78 mg/dL (ref 65–99)
Glucose-Capillary: 101 mg/dL — ABNORMAL HIGH (ref 65–99)
Glucose-Capillary: 92 mg/dL (ref 65–99)

## 2016-06-24 LAB — CBC
HCT: 30 % — ABNORMAL LOW (ref 39.0–52.0)
Hemoglobin: 9.2 g/dL — ABNORMAL LOW (ref 13.0–17.0)
MCH: 28 pg (ref 26.0–34.0)
MCHC: 30.7 g/dL (ref 30.0–36.0)
MCV: 91.2 fL (ref 78.0–100.0)
PLATELETS: 194 10*3/uL (ref 150–400)
RBC: 3.29 MIL/uL — ABNORMAL LOW (ref 4.22–5.81)
RDW: 17.1 % — ABNORMAL HIGH (ref 11.5–15.5)
WBC: 6.1 10*3/uL (ref 4.0–10.5)

## 2016-06-24 MED ORDER — LIDOCAINE-PRILOCAINE 2.5-2.5 % EX CREA: 1.0000 "application " | TOPICAL_CREAM | CUTANEOUS | Status: DC | PRN

## 2016-06-24 MED ORDER — PENTAFLUOROPROP-TETRAFLUOROETH EX AERO: 1.0000 "application " | INHALATION_SPRAY | CUTANEOUS | Status: DC | PRN

## 2016-06-24 MED ORDER — CARVEDILOL 3.125 MG PO TABS
3.1250 mg | ORAL_TABLET | Freq: Two times a day (BID) | ORAL | Status: DC
  Administered 2016-06-24 – 2016-06-26 (×4): 3.125 mg via ORAL
  Filled 2016-06-24 (×4): qty 1

## 2016-06-24 MED ORDER — ALTEPLASE 2 MG IJ SOLR: 2.0000 mg | Freq: Once | INTRAMUSCULAR | Status: DC | PRN

## 2016-06-24 MED ORDER — SODIUM CHLORIDE 0.9 % IV SOLN: 100.0000 mL | INTRAVENOUS | Status: DC | PRN

## 2016-06-24 MED ORDER — HEPARIN SODIUM (PORCINE) 1000 UNIT/ML DIALYSIS: 1000.0000 [IU] | INTRAMUSCULAR | Status: DC | PRN

## 2016-06-24 MED ORDER — ACETAMINOPHEN 325 MG PO TABS
ORAL_TABLET | ORAL | Status: AC
  Administered 2016-06-24: 650 mg via ORAL
  Filled 2016-06-24: qty 2

## 2016-06-24 MED ORDER — LIDOCAINE HCL (PF) 1 % IJ SOLN: 5.0000 mL | INTRAMUSCULAR | Status: DC | PRN

## 2016-06-24 NOTE — Procedures (Signed)
Seen on HD, tot goal of 3000cc, BP ok, no hemodynamic issues. Justin Fitzgerald C

## 2016-06-24 NOTE — Progress Notes (Signed)
Patient Name: Justin Fitzgerald Date of Encounter: 06/24/2016  Primary Cardiologist: New- [facility] Problem List     Principal Problem:   CVA (cerebral vascular accident) [facility]) Active Problems:   Essential hypertension   Type 2 diabetes mellitus (HCC)   End stage renal disease (HCC)   Acute ischemic stroke (HCC)   Peripheral vascular disease (HCC)   Chronic combined systolic and diastolic congestive heart failure (HCC)     Subjective   No complaints, up eating breakfast.   Inpatient Medications    Scheduled Meds: . aspirin EC  81 mg Oral Daily  . atorvastatin  80 mg Oral q1800  . budesonide (PULMICORT) nebulizer solution  0.25 mg Nebulization BID  . cinacalcet  120 mg Oral Q supper  . clopidogrel  75 mg Oral Daily  . gabapentin  300 mg Oral QHS  . heparin subcutaneous  5,000 Units Subcutaneous Q8H  . insulin aspart  0-5 Units Subcutaneous QHS  . insulin aspart  0-9 Units Subcutaneous TID WC  . sevelamer carbonate  2.4 g Oral TID WC  . sodium chloride flush  3 mL Intravenous Q12H  . tiotropium  18 mcg Inhalation Daily   Continuous Infusions:  PRN Meds: sodium chloride, sodium chloride, sodium chloride, acetaminophen, albuterol, alteplase, heparin, heparin, lidocaine (PF), lidocaine-prilocaine, ondansetron (ZOFRAN) IV, pentafluoroprop-tetrafluoroeth, sodium chloride flush   Vital Signs    Vitals:   06/23/16 1824 06/23/16 1915 06/23/16 2000 06/24/16 0354  BP: 123/80 108/64  (!) 108/55  Pulse: 83 86 96 92  Resp: 19 (!) 31 (!) 21 18  Temp: 98 F (36.7 C) 98.1 F (36.7 C)  98.3 F (36.8 C)  TempSrc: Oral Oral  Oral  SpO2: 97% 99% 99% 97%  Weight:    (!) 313 lb 15 oz (142.4 kg)  Height:        Intake/Output Summary (Last 24 hours) at 06/24/16 0736 Last data filed at 06/24/16 0458  Gross per 24 hour  Intake              240 ml  Output              100 ml  Net              140 ml   Filed Weights   06/22/16 1320 06/22/16 1730 06/24/16 0354   Weight: (!) 313 lb 15 oz (142.4 kg) (!) 306 lb (138.8 kg) (!) 313 lb 15 oz (142.4 kg)    Physical Exam   GEN: Well nourished, well developed, in no acute distress. Morbidly obese, appears older than stated age.  HEENT: Grossly normal.  Neck: Supple, no JVD, carotid bruits, or masses. Cardiac: RRR, no murmurs, rubs, or gallops. No clubbing, cyanosis, edema.  Radials/DP/PT 2+ and equal bilaterally.  Respiratory:  Respirations regular and unlabored, clear to auscultation bilaterally. GI: Soft, nontender, nondistended, BS + x 4. MS: no deformity or atrophy. Skin: warm and dry, no rash. Neuro:  Strength and sensation are intact. Psych: AAOx3.  Normal affect.  Labs    CBC  Recent Labs  06/21/16 1205  06/23/16 0025 06/24/16 0357  WBC 7.3  < > 7.7 6.1  NEUTROABS 4.8  --   --   --   HGB 9.4*  < > 9.7* 9.2*  HCT 30.8*  < > 31.4* 30.0*  MCV 91.9  < > 91.3 91.2  PLT 246  < > 191 194  < > = values in this interval not displayed. Basic Metabolic  Panel  Recent Labs  06/22/16 1422 06/23/16 0722  NA 137 137  K 3.7 3.9  CL 96* 99*  CO2 26 26  GLUCOSE 106* 90  BUN 32* 19  CREATININE 8.06* 5.91*  CALCIUM 8.7* 9.1  PHOS 6.2* 5.1*   Liver Function Tests  Recent Labs  06/21/16 1205 06/22/16 1422 06/23/16 0722  AST 19  --   --   ALT 14*  --   --   ALKPHOS 101  --   --   BILITOT 0.4  --   --   PROT 6.7  --   --   ALBUMIN 2.9* 2.7* 3.1*   No results for input(s): LIPASE, AMYLASE in the last 72 hours. Cardiac Enzymes No results for input(s): CKTOTAL, CKMB, CKMBINDEX, TROPONINI in the last 72 hours. BNP Invalid input(s): POCBNP D-Dimer No results for input(s): DDIMER in the last 72 hours. Hemoglobin A1C  Recent Labs  06/22/16 0235  HGBA1C 5.0   Fasting Lipid Panel  Recent Labs  06/22/16 0235  CHOL 170  HDL 35*  LDLCALC 98  TRIG 014*  CHOLHDL 4.9   Thyroid Function Tests  Recent Labs  06/23/16 0722  TSH 2.302    Telemetry    Sinus to sinus tach -  Personally Reviewed  ECG    No new ECG - Personally Reviewed  Radiology    No results found.  Cardiac Studies   Echo 06/22/16 Study Conclusions - Left ventricle: The cavity size was moderately dilated. Wall thickness was increased in a pattern of moderate LVH. Systolic function was severely reduced. The estimated ejection fraction was in the range of 20% to 25%. Diffuse hypokinesis. Regional wall motion abnormalities cannot be excluded. Doppler parameters are consistent with abnormal left ventricular relaxation (grade 1 diastolic dysfunction). - Aortic valve: Valve mobility was mildly restricted. Transvalvular velocity was increased. There was mild stenosis. There was mild regurgitation. Valve area (VTI): 1.19 cm^2. Valve area (Vmax): 1.21 cm^2. Valve area (Vmean): 1.09 cm^2. - Aortic root: The aortic root was mildly dilated. - Mitral valve: Mildly to moderately calcified annulus. - Left atrium: The atrium was moderately dilated.   06/23/16 Procedures  Right/Left Heart Cath and Coronary Angiography  Conclusion  1. Heavily calcified coronary arteries with chronic occlusion of the first OM branch of the circumflex, moderate diffuse RCA stenosis, and mild nonobstructive LAD stenosis 2. Normal right heart hemodynamics and normal LVEDP 3. Known severe LV systolic dysfunction  Recommend: Medical therapy for CAD and cardiomyopathy. 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     Patient Profile     56 y.o. male with a PMHx of ESRD on HD, HTN, PVD, DM, morbid obesity and tobacco abuse who was admitted to [facility] on 2/7/2018for evaluation of CVA. During work up he was found to have severe LV dysfunction and cardiology consulted.   Assessment & Plan    Newly diagnosed LV dysfunction: he under went [facility] yesterday which showed heavily calcified coronary arteries with chronic occlusion of the first OM branch of the  circumflex, moderate diffuse RCA stenosis, and mild nonobstructive LAD stenosis. No good targets for PCI and plan to treat medically. It was suspected that his cardiomyopathy was primarily nonischemic (likely hypertensive cardiomyopathy). Filling pressures on cath were normal. Plan for volume management per nephrology (HD). -- BP has been too soft to initiate CHF meds. Will plan to add Coreg 3.125mg  BID now and titrate as BP allows.   HTN: BP has been soft.  I started Coreg 3.125mg  BID, but I am worried he may not even tolerate this. Will see how he does on HD today   CAD: as above, L/RHC yesterday which showed heavily calcified coronary arteries with chronic occlusion of the first OM branch of the circumflex, moderate diffuse RCA stenosis, and mild nonobstructive LAD stenosis. No good targets for PCI and plan to treat medically. Continue ASA, statin. Trying to see if he will tolerate a small dose of Coreg.   Tobacco abuse: counseled on cessation.   ESRD on HD: to be transferred to 6E today and HD planned for this AM  Morbid obesity: Body mass index is 42.58 kg/m. Diet and exercise recommended   DMT2: under excellent control. Hg A1c 5.0  Signed, Cline Crock, PA-C  06/24/2016, 7:36 AM   Attending Note:   The patient was seen and examined.  Agree with assessment and plan as noted above.  Changes made to the above note as needed.  Patient seen and independently examined with Carlean Jews, PA .   We discussed all aspects of the encounter. I agree with the assessment and plan as stated above.  1.  Chronic systolic CHF Has an occlusion of OM.   Coronaries are very heavily calcified.  LV function is severely reduced and the LV is dilated.  His CHF is likely nonischemic Right heart pressures are good. Has been started on coreg 3.125 BID ,   Will see how he tolerates that.  We are limited by borderline low BP  2. CVA - going for TEE on Monday .  3. ESRD - dialysis     I have  spent a total of 30 minutes with patient reviewing hospital  notes , telemetry, EKGs, labs and examining patient as well as establishing an assessment and plan that was discussed with the patient. > 50% of time was spent in direct patient care.    Vesta Mixer, Montez Hageman., MD, [facility] 06/24/2016, 11:20 AM [address]  [address] Office [phone] Pager [phone]

## 2016-06-24 NOTE — Progress Notes (Signed)
Subjective:  Patient seen in dialysis this morning. He says he is feeling well and denies any current pain. He is aware of his cardiac cath results and we discussed our plans to work on his medical management for his cardiomyopathy, CAD, and recent stroke. He does express his annoyance and discouragement by the amount of tests he has undergone during this admission and tells me that he is strongly in favor of leaving the hospital prior to his scheduled TEE on Monday 06/26/2016. I did discuss the reasons we are pursuing a TEE to rule out any possible cardioembolic source of his stroke as this would change our management. He has agreed to stay overnight to think this over and we will revisit this issue tomorrow morning. I did also speak with his wife, Rosey Bath, over the phone and updated her on his hospital course and plans going forward. She would like him to stay in the hospital to complete the stroke workup and will discuss this with him as well.  Objective:  Vital signs in last 24 hours: Vitals:   06/24/16 1030 06/24/16 1100 06/24/16 1130 06/24/16 1200  BP: 95/67 104/69 105/60 94/63  Pulse: 82 86 86 90  Resp:      Temp:      TempSrc:      SpO2:      Weight:      Height:       General: obese gentleman, resting in bed during dialysis, no acute distress Cardiac: RRR, flow murmru Pulm: clear to auscultation anteriorly Abd: obese abdomen Ext: right femoral cath site with dry dressing in place Neuro: alert and oriented X3, no facial droop   Assessment/Plan:  Principal Problem:   CVA (cerebral vascular accident) (HCC) Active Problems:   Essential hypertension   Type 2 diabetes mellitus (HCC)   End stage renal disease (HCC)   Acute ischemic stroke (HCC)   Peripheral vascular disease (HCC)   Chronic combined systolic and diastolic congestive heart failure [facility])  Mr. Helferich is a 56 year old African-American gentleman with a medical history of end-stage renal disease, hypertension and  diabetes who presented with left facial droop upon awakening this morning and a CT scan concerning for acute infarct.   Stroke - Right MCA [place] Infarcts Neurological deficits of left facial droop and slurred speech have resolved. We suspect the stroke is secondary to a ruptured plaque from the severely stenosed right M2 as seen on MRA Head, however cardioembolic source needs to be ruled out for which we are investigating with a planned TEE on Monday (should patient elect to remain in the hospital). We are foregoing anticoagulation until a definite cardioembolic source is identified and will continue with dual antiplatelet therapy. - Atorvastatin 80 once daily - Permissive hypertension with a goal of 220/110 - Clopidogrel 75 mg + Aspirin 81mg  - TEE on Monday  ESRD on HD TTS HTN- labile with several episodes of hypotension Access via right IJ tunneled catheter. We are holding his home antihypertensive medications toallow for permissive hypertension with a theoretical benefit of perfusing the penumbra, although his blood pressures have been low this admission. For now, his blood pressures are largely being controlled with the amount of volume taken off of dialysis.  -- Continue HD per Nephrology - HD today -- Blood pressure goal of < 220/110 -- Home meds that are held include Metolazone, furosemide, metoprolol   Combined systolic and diastolic congestive heart failure TTE showed a left ventricular ejection fraction in the range of 20-25%. Grade 1  diastolic dysfunction was also noted. This consistent with a combined systolic and diastolic congestive heart failure picture. Cardiac cath on 06/23/16 did show heavily calcified coronaries with occlusion of OM without feasible PCI. His cardiomyopathy is thought to be unrelated to ischemia. CHF medications are being slowly introduced with Carvedilol 3.125 mg BID per cards as his BP allows. He reports a history of SOB with Ramipril in the past (unsure  of angioedema) so ACE-I/ARBs may may not be an option going forward. - Carvedilol 3.125 mg BID; titrate as BP allows - Appreciate cardiology recommendations  DM - SSI-S - Hemoglobin A1c 5.0 (06/22/2016)  ? COPD Patient with tiotropiumlisted as a medication. Prior hospitalization for potential COPD exacerbation. No PFTs on file -- Spiriva -- Albuterol nebulizer q6 hrs prn  DVT/PE prophylaxis: heparin FEN/GI:HH/Carb Code: Full code  Dispo: Anticipated discharge in approximately 2-3 day(s).   Darreld Mclean, MD 06/24/2016, 12:34 PM Pager: [phone]

## 2016-06-24 NOTE — Progress Notes (Signed)
  Date: 06/24/2016  Patient name: Justin Fitzgerald  Medical record number: [id number]  Date of birth: 1960-12-07   I have seen and evaluated this patient and I have discussed the plan of care with the house staff. Please see Dr. Eliane Decree note for complete details. I concur with his findings.  Inez Catalina, MD 06/24/2016, 8:20 PM

## 2016-06-24 NOTE — Progress Notes (Signed)
Patient arrived to unit by bed.  Reviewed treatment plan and this RN agrees with plan.  Report received from bedside RN, Mardelle Matte.  Consent obtained.  Patient A & O X 4.   Lung sounds diminished to ausculation in all fields. Generalized edema. Cardiac:  NSR.  Removed caps and cleansed RIJ catheter with chlorhedxidine.  Aspirated ports of heparin and flushed them with saline per protocol.  Connected and secured lines, initiated treatment at 0902.  UF Goal of 3000 mL and net fluid removal 2.5 L.  Will continue to monitor.

## 2016-06-24 NOTE — Progress Notes (Signed)
STROKE TEAM PROGRESS NOTE   SUBJECTIVE (INTERVAL HISTORY) No new complaints. On dialysis today. TEE monday. No complaints. Patient stated that his left-sided weakness has resolved.  PHYSICAL EXAM Temp:  [98 F (36.7 C)-98.9 F (37.2 C)] 98.8 F (37.1 C) (02/10 0745) Pulse Rate:  [0-237] 97 (02/10 0745) Cardiac Rhythm: Normal sinus rhythm (02/10 0745) Resp:  [0-31] 18 (02/10 0354) BP: (91-126)/(53-80) 100/66 (02/10 0745) SpO2:  [0 %-100 %] 96 % (02/10 0745) Weight:  [142.4 kg (313 lb 15 oz)] 142.4 kg (313 lb 15 oz) (02/10 0354)  Obese middle-aged [place] American male   . Not in distress.2 plus pedal edema bilaterally.  . Afebrile. Head is nontraumatic. Neck is supple without bruit.    Cardiac exam no murmur or gallop. Lungs are clear to auscultation. Distal pulses are well felt. Neurological Exam :  Awake alert oriented x 3 normal speech and language.No face asymmetry. Tongue midline. No drift. Mild diminished fine finger movements on left. Orbits right over left upper extremity. Mild left grip weak.. Normal sensation . Normal coordination.  Imaging studies I have personally reviewed the radiological images below and agree with the radiology interpretations.  Mri and Mra Head and neck Wo Contrast 06/21/2016 IMPRESSION: MRI HEAD: Mildly motion degraded examination. Multifocal acute on subacute RIGHT MCA territory infarcts including RIGHT basal ganglia. Mild to moderate white matter changes compatible chronic small vessel ischemic disease. MRA HEAD: Moderately motion degraded examination. No emergent large vessel occlusion. Severe stenosis RIGHT M2 origin. Moderate stenosis RIGHT M1 and LEFT M2 origin. MRA NECK: Negative noncontrast moderately motion degraded examination.   Ct Head Code Stroke W/o Cm 06/21/2016 IMPRESSION: 1. Subtle loss of gray-white differentiation in the posterior right insula and lentiform nucleus suggesting acute ischemia. 2. Chronic bilateral maxillary sinus disease 3.  ASPECTS is 8/10   LE venous Doppler - no DVT  TTE -  - Left ventricle: The cavity size was moderately dilated. Wall   thickness was increased in a pattern of moderate LVH. Systolic   function was severely reduced. The estimated ejection fraction   was in the range of 20% to 25%. Diffuse hypokinesis. Regional   wall motion abnormalities cannot be excluded. Doppler parameters   are consistent with abnormal left ventricular relaxation (grade 1   diastolic dysfunction). - Aortic valve: Valve mobility was mildly restricted. Transvalvular   velocity was increased. There was mild stenosis. There was mild   regurgitation. Valve area (VTI): 1.19 cm^2. Valve area (Vmax):   1.21 cm^2. Valve area (Vmean): 1.09 cm^2. - Aortic root: The aortic root was mildly dilated. - Mitral valve: Mildly to moderately calcified annulus. - Left atrium: The atrium was moderately dilated.  Cardiac cath 1. Heavily calcified coronary arteries with chronic occlusion of the first OM branch of the circumflex, moderate diffuse RCA stenosis, and mild nonobstructive LAD stenosis 2. Normal right heart hemodynamics and normal LVEDP 3. Known severe LV systolic dysfunction Recommend: Medical therapy for CAD and cardiomyopathy. 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   ASSESSMENT/PLAN Mr. Reign Hazelrigg is a 56 y.o. male with history of end-stage renal disease on HD, HTN, diabetes, and peripheral vascular disease presenting with left facial droop and slurred speech. He did not receive IV t-PA due to delay in arrival.   Stroke:  right MCA territory infarcts embolic secondary to unknown source  Resultant: Deficit resolved  Code stroke CT Right posterior insula and lentiform nucleus suggesting acute ischemia.   MRI  multifocal  acute on subacute right MCA territory infarcts.  MRA head  no LVO. Severe stenosis R M2 origin. Moderate stenosis R M1 and L M2 origin  MRA  neck negative  2D Echo  EF 20-25% with diffuse hypokinesis  LE venous dopplers no DVT  TEE to look for embolic source planned for Monday   LDL 98  HgbA1c 5.0  Heparin 5000 units sq tid for VTE prophylaxis Diet heart healthy/carb modified Room service appropriate? Yes; Fluid consistency: Thin  clopidogrel 75 mg daily prior to admission, now on aspirin 81 mg daily and clopidogrel 75 mg daily. From stroke standpoint, there is no indication for Anticoagulation without identified embolic source.    Therapy recommendations:  No therapy needs  Disposition:  Return home  Cardiomyopathy  TTE EF 20-25%  hypertension versus prior infarct   TEE pending  CAD  Cardiac cath showed chronic multivessel CAD  On dual antiplatelet  Cardiology on board  Hypertension  Stable Permissive hypertension (OK if < 220/120) but gradually normalize in 5-7 days Long-term BP goal normotensive  Hyperlipidemia  Home meds:  No statin  LDL 98, goal < 70  Now on Lipitor 80 mg daily   Continue statin at discharge  Diabetes  HgbA1c 5.0, goal < 7.0  Controlled  Tobacco abuse  Current smoker  Smoking cessation counseling provided  Pt is willing to quit  Other Stroke Risk Factors  Morbid Obesity, Body mass index is 42.58 kg/m.  Peripheral vascular disease  Other Active Problems  ESRD on HD  Anemia   COPD  Hospital day # 3  Marvel Plan, MD PhD Stroke Neurology 06/24/2016 6:36 PM   To contact Stroke Continuity provider, please refer to [email or web address]. After hours, contact General Neurology

## 2016-06-24 NOTE — Progress Notes (Signed)
Dialysis treatment completed.  3000 mL ultrafiltrated.  2500 mL net fluid removal.  Patient status unchanged. Lung sounds diminished to ausculation in all fields. Generalized edema. Cardiac: NSR.  Cleansed RIJ catheter with chlorhexidine.  Disconnected lines and flushed ports with saline per protocol.  Ports locked with heparin and capped per protocol.    Report given to bedside, RN Mardelle Matte.

## 2016-06-25 DIAGNOSIS — I2510 Atherosclerotic heart disease of native coronary artery without angina pectoris: Secondary | ICD-10-CM

## 2016-06-25 DIAGNOSIS — I639 Cerebral infarction, unspecified: Secondary | ICD-10-CM

## 2016-06-25 LAB — HEPATITIS B SURFACE ANTIBODY,QUALITATIVE: Hep B S Ab: REACTIVE

## 2016-06-25 LAB — GLUCOSE, CAPILLARY
GLUCOSE-CAPILLARY: 95 mg/dL (ref 65–99)
GLUCOSE-CAPILLARY: 90 mg/dL (ref 65–99)
Glucose-Capillary: 102 mg/dL — ABNORMAL HIGH (ref 65–99)

## 2016-06-25 LAB — CBC
HCT: 29.6 % — ABNORMAL LOW (ref 39.0–52.0)
HEMOGLOBIN: 9.1 g/dL — AB (ref 13.0–17.0)
MCH: 28.1 pg (ref 26.0–34.0)
MCHC: 30.7 g/dL (ref 30.0–36.0)
MCV: 91.4 fL (ref 78.0–100.0)
Platelets: 170 10*3/uL (ref 150–400)
RBC: 3.24 MIL/uL — AB (ref 4.22–5.81)
RDW: 16.7 % — ABNORMAL HIGH (ref 11.5–15.5)
WBC: 7.8 10*3/uL (ref 4.0–10.5)

## 2016-06-25 LAB — HEPATITIS B CORE ANTIBODY, TOTAL: Hep B Core Total Ab: NEGATIVE

## 2016-06-25 LAB — HEPATITIS B SURFACE ANTIGEN: Hepatitis B Surface Ag: NEGATIVE

## 2016-06-25 NOTE — Evaluation (Signed)
Occupational Therapy Evaluation Patient Details Name: Justin Fitzgerald MRN: [id number] DOB: August 19, 1960 Today's Date: 06/25/2016    History of Present Illness Justin Fitzgerald is a 56 year old African-American gentleman with a medical history of end-stage renal disease, hypertension and diabetes who presented with left facial droop upon awakening and a CT scan concerning for acute infarct. MRI revealed Multifocal acute on subacute RIGHT MCA territory infarcts including RIGHT basal ganglia.   Clinical Impression   Pt admitted with the above diagnoses and presents with below problem list. Pt will benefit from continued acute OT to address the below listed deficits and maximize independence with basic ADLs. PTA pt was independent with ADLs, reports wife does assist him with RUE/R flank bathing due to need to keep HD site dry. Pt appears to be at/near baseline with ADLs. Completed household distance functional mobility (walked in hallway) with no AD and good toleration. Wide BOS noted and some swaying but suspect this is baseline. No LOB. Pt does have mild residual FMC deficits in L hand.     Follow Up Recommendations  No OT follow up    Equipment Recommendations  None recommended by OT    Recommendations for Other Services       Precautions / Restrictions Precautions Precautions: Fall Restrictions Weight Bearing Restrictions: No      Mobility Bed Mobility               General bed mobility comments: up in chair  Transfers Overall transfer level: Modified independent Equipment used: None                  Balance Overall balance assessment: No apparent balance deficits (not formally assessed) (appears to be at baseline)                                          ADL Overall ADL's : At baseline                                       General ADL Comments: mild FMC deficits in L non-dominant hand. Pt reports some assist with  bathing RUE area due to HD catheter right internal jugular- not allowed to get site wet. Able to open and close most containers but did note increased time/effort with more challenging FMC tasks (opening travel toothpaste cap). Pt completed household distance functional mobility. Wide BOS noted and some swaying with gait, suspect baseline gait. No LOB. "I feel fine. I'm ready to go home after my test tomorrow."     Vision     Perception     Praxis      Pertinent Vitals/Pain Pain Assessment: No/denies pain     Hand Dominance Right   Extremity/Trunk Assessment Upper Extremity Assessment Upper Extremity Assessment: LUE deficits/detail LUE Deficits / Details: mild FMC deficits. sensation intact. LUE Coordination: decreased fine motor (mild deficits)   Lower Extremity Assessment Lower Extremity Assessment: Overall WFL for tasks assessed       Communication Communication Communication: No difficulties   Cognition Arousal/Alertness: Awake/alert Behavior During Therapy: WFL for tasks assessed/performed Overall Cognitive Status: Within Functional Limits for tasks assessed                     General Comments       Exercises Exercises:  Other exercises Other Exercises Other Exercises: Educated pt on activities to promote [facility] exercise of L hand.   Shoulder Instructions      Home Living Family/patient expects to be discharged to:: Private residence Living Arrangements: Spouse/significant other;Children Available Help at Discharge: Family Type of Home: House Home Access: Level entry     Home Layout: One level     Bathroom Shower/Tub: Chief Strategy Officer: Standard     Home Equipment: Gilmer Mor - single point      Lives With: Spouse;Family    Prior Functioning/Environment Level of Independence: Independent        Comments: occasional use of cane        OT Problem List: Decreased coordination;Obesity   OT Treatment/Interventions:  Therapeutic exercise;Self-care/ADL training;Therapeutic activities;Patient/family education    OT Goals(Current goals can be found in the care plan section) Acute Rehab OT Goals Patient Stated Goal: "I'm ready to go home tomorrow after my test. I wish I could go home today but I understand." OT Goal Formulation: With patient Time For Goal Achievement: 07/02/16 Potential to Achieve Goals: Good ADL Goals Pt/caregiver will Perform Home Exercise Program: Independently;With written HEP provided (L hand FMC)  OT Frequency: Min 1X/week   Barriers to D/C:            Co-evaluation              End of Session Equipment Utilized During Treatment: Gait belt Nurse Communication: Other (comment) (ok to walk in hall)  Activity Tolerance: Patient tolerated treatment well Patient left: in chair;with call bell/phone within reach   Time: 1610-9604 OT Time Calculation (min): 18 min Charges:  OT General Charges $OT Visit: 1 Procedure OT Evaluation $OT Eval Low Complexity: 1 Procedure G-Codes:    Justin Fitzgerald 07-14-16, 10:58 AM

## 2016-06-25 NOTE — Progress Notes (Signed)
  Date: 06/25/2016  Patient name: Justin Fitzgerald  Medical record number: [id number]  Date of birth: [birth year]   This patient's plan of care was discussed with the house staff. Please see their note for complete details. I concur with their findings.   Inez Catalina, MD 06/25/2016, 8:49 PM

## 2016-06-25 NOTE — Progress Notes (Signed)
Assessment:  1 ESRD on HD currently via [facility] but also has PD catheter 2 CVA Plan: 1 HD judiciously with attention to avoid hypotension 2 HD TTS   Subjective: Interval History: for TEE in AM  Objective: Vital signs in last 24 hours: Temp:  [97.9 F (36.6 C)-98.6 F (37 C)] 98.6 F (37 C) (02/11 0844) Pulse Rate:  [88-91] 88 (02/11 0844) Resp:  [18-20] 20 (02/11 0844) BP: (92-113)/(58-74) 92/62 (02/11 0844) SpO2:  [96 %-98 %] 96 % (02/11 0844) Weight:  [139 kg (306 lb 6.4 oz)] 139 kg (306 lb 6.4 oz) (02/10 2151) Weight change: 2.1 kg (4 lb 10.1 oz)  Intake/Output from previous day: 02/10 0701 - 02/11 0700 In: 1200 [P.O.:1200] Out: 2700 [Urine:200] Intake/Output this shift: No intake/output data recorded.  General appearance: alert and cooperative Resp: clear to auscultation bilaterally Chest wall: no tenderness Cardio: regular rate and rhythm, S1, S2 normal, no murmur, click, rub or gallop Extremities: edema 11-2+  Lab Results:  Recent Labs  06/24/16 0357 06/25/16 0223  WBC 6.1 7.8  HGB 9.2* 9.1*  HCT 30.0* 29.6*  PLT 194 170   BMET:  Recent Labs  06/23/16 0722 06/24/16 0932  NA 137 134*  K 3.9 3.8  CL 99* 97*  CO2 26 26  GLUCOSE 90 115*  BUN 19 25*  CREATININE 5.91* 7.43*  CALCIUM 9.1 8.4*   No results for input(s): PTH in the last 72 hours. Iron Studies: No results for input(s): IRON, TIBC, TRANSFERRIN, FERRITIN in the last 72 hours. Studies/Results: No results found.  Scheduled: . aspirin EC  81 mg Oral Daily  . atorvastatin  80 mg Oral q1800  . budesonide (PULMICORT) nebulizer solution  0.25 mg Nebulization BID  . carvedilol  3.125 mg Oral BID WC  . cinacalcet  120 mg Oral Q supper  . clopidogrel  75 mg Oral Daily  . gabapentin  300 mg Oral QHS  . heparin subcutaneous  5,000 Units Subcutaneous Q8H  . insulin aspart  0-5 Units Subcutaneous QHS  . insulin aspart  0-9 Units Subcutaneous TID WC  . sevelamer carbonate  2.4 g Oral TID WC  .  sodium chloride flush  3 mL Intravenous Q12H  . tiotropium  18 mcg Inhalation Daily     LOS: 4 days   Rohn Fritsch C 06/25/2016,3:45 PM

## 2016-06-25 NOTE — Progress Notes (Addendum)
Subjective:  No acute events overnight. Patient is doing well this morning. He was sitting in a chair and talking to his family on the phone. He is ready to go home but states that he is willing to stay until tomorrow to have this TEE completed. He is recovering appropriately following his catheterization. All of his questions were answered.  Objective:  Vital signs in last 24 hours: Vitals:   06/24/16 1457 06/24/16 1718 06/24/16 2151 06/25/16 0450  BP: 101/64 107/67 (!) 99/58 113/74  Pulse: 88 91 89 90  Resp: 18  18 18   Temp: 98.4 F (36.9 C) 98.1 F (36.7 C) 98.3 F (36.8 C) 97.9 F (36.6 C)  TempSrc: Oral Oral Oral Oral  SpO2: 97% 98% 96% 96%  Weight:   (!) 306 lb 6.4 oz (139 kg)   Height:   6' (1.829 m)    General: obese gentleman, Resting comfortably and in no acute distress Cardiac: RRR, heart sounds distant Pulm: clear to auscultation anteriorly Abd: obese abdomen, normal bowel sounds Ext: right femoral cath site with dry dressing in place Neuro: alert and oriented X3, no facial droop MSK: Bi-lateral lower extremity edema   Assessment/Plan:  Principal Problem:   CVA (cerebral vascular accident) (HCC) Active Problems:   Essential hypertension   Type 2 diabetes mellitus (HCC)   End stage renal disease (HCC)   Acute ischemic stroke (HCC)   Peripheral vascular disease (HCC)   Chronic combined systolic and diastolic congestive heart failure [facility])  Justin Fitzgerald is a 56 year old African-American gentleman with a medical history of end-stage renal disease, hypertension and diabetes who presented with left facial droop upon awakening this morning and a CT scan concerning for acute infarct.   Stroke - Right MCA Territory Infarcts, no remaining deficits Neurological deficits of left facial droop and slurred speech have resolved. We suspect the stroke is secondary to a ruptured plaque from the severely stenosed right M2 as seen on MRA Head, however cardioembolic  source needs to be ruled out for which we are investigating with a planned TEE on Monday. We are foregoing anticoagulation until a definite cardioembolic source is identified and will continue with dual antiplatelet therapy. - Atorvastatin 80 once daily - Permissive hypertension with a goal of 220/110 - Clopidogrel 75 mg + Aspirin 81mg  - TEE on Monday  ESRD on HD TTS HTN- labile with several episodes of hypotension Access via right IJ tunneled catheter. We are holding his home antihypertensive medications toallow for permissive hypertension with a theoretical benefit of perfusing the penumbra, although his blood pressures have been low this admission. For now, his blood pressures are largely being controlled with the amount of volume taken off of dialysis.  -- Continue HD per Nephrology - HD today -- Blood pressure goal of < 220/110 -- Home meds that are held include Metolazone, furosemide, metoprolol  -- Started Carvedilol 3.125mg  BID for CHF, monitor BP closely   Combined systolic and diastolic congestive heart failure TTE showed a left ventricular ejection fraction in the range of 20-25%. Grade 1 diastolic dysfunction was also noted. This consistent with a combined systolic and diastolic congestive heart failure picture. Cardiac cath on 06/23/16 did show heavily calcified coronaries with occlusion of OM without feasible PCI. His cardiomyopathy is thought to be unrelated to ischemia. CHF medications are being slowly introduced with Carvedilol 3.125 mg BID per cards as his BP allows. ICD may be beneficial in a patient with NICM and an EF < 35% ( for ischemic cardiomyopathy  ICD would be indicated with an EF < 30%). - Carvedilol 3.125 mg BID; titrate as BP allows - Appreciate cardiology recommendations - ICD- consider if EF does not improve after maximal medical management with outpatient follow-up.  DM - SSI-S - Hemoglobin A1c 5.0 (06/22/2016)  ? COPD Patient with tiotropiumlisted as a  medication. Prior hospitalization for potential COPD exacerbation. No PFTs on file -- Spiriva -- Albuterol nebulizer q6 hrs prn  DVT/PE prophylaxis: heparin FEN/GI:HH/Carb Code: Full code  Dispo: Anticipated discharge tomorrow following TEE  Justin Lot, MD 06/25/2016, 7:58 AM Pager: [phone]

## 2016-06-25 NOTE — Progress Notes (Signed)
STROKE TEAM PROGRESS NOTE   SUBJECTIVE (INTERVAL HISTORY) Two family members are at bedside. No new complaints. Had dialysis yesterday. TEE monday. No complaints. Family feels that he is back to baseline too.  PHYSICAL EXAM Temp:  [97.9 F (36.6 C)-98.6 F (37 C)] 98.6 F (37 C) (02/11 0844) Pulse Rate:  [88-91] 88 (02/11 0844) Cardiac Rhythm: Normal sinus rhythm (02/11 0800) Resp:  [18-20] 20 (02/11 0844) BP: (92-113)/(58-74) 92/62 (02/11 0844) SpO2:  [96 %-98 %] 96 % (02/11 0844) Weight:  [139 kg (306 lb 6.4 oz)] 139 kg (306 lb 6.4 oz) (02/10 2151)  Obese middle-aged [place] American male   . Not in distress.2 plus pedal edema bilaterally.  . Afebrile. Head is nontraumatic. Neck is supple without bruit.    Cardiac exam no murmur or gallop. Lungs are clear to auscultation. Distal pulses are well felt. Neurological Exam :  Awake alert oriented x 3 normal speech and language.No face asymmetry. Tongue midline. No drift. Mild diminished fine finger movements on left. Orbits right over left upper extremity. Mild left grip weak.. Normal sensation . Normal coordination.  Imaging studies I have personally reviewed the radiological images below and agree with the radiology interpretations.  Mri and Mra Head and neck Wo Contrast 06/21/2016 IMPRESSION: MRI HEAD: Mildly motion degraded examination. Multifocal acute on subacute RIGHT MCA territory infarcts including RIGHT basal ganglia. Mild to moderate white matter changes compatible chronic small vessel ischemic disease. MRA HEAD: Moderately motion degraded examination. No emergent large vessel occlusion. Severe stenosis RIGHT M2 origin. Moderate stenosis RIGHT M1 and LEFT M2 origin. MRA NECK: Negative noncontrast moderately motion degraded examination.   Ct Head Code Stroke W/o Cm 06/21/2016 IMPRESSION: 1. Subtle loss of gray-white differentiation in the posterior right insula and lentiform nucleus suggesting acute ischemia. 2. Chronic bilateral  maxillary sinus disease 3. ASPECTS is 8/10   LE venous Doppler - no DVT  TTE -  - Left ventricle: The cavity size was moderately dilated. Wall   thickness was increased in a pattern of moderate LVH. Systolic   function was severely reduced. The estimated ejection fraction   was in the range of 20% to 25%. Diffuse hypokinesis. Regional   wall motion abnormalities cannot be excluded. Doppler parameters   are consistent with abnormal left ventricular relaxation (grade 1   diastolic dysfunction). - Aortic valve: Valve mobility was mildly restricted. Transvalvular   velocity was increased. There was mild stenosis. There was mild   regurgitation. Valve area (VTI): 1.19 cm^2. Valve area (Vmax):   1.21 cm^2. Valve area (Vmean): 1.09 cm^2. - Aortic root: The aortic root was mildly dilated. - Mitral valve: Mildly to moderately calcified annulus. - Left atrium: The atrium was moderately dilated.  Cardiac cath 1. Heavily calcified coronary arteries with chronic occlusion of the first OM branch of the circumflex, moderate diffuse RCA stenosis, and mild nonobstructive LAD stenosis 2. Normal right heart hemodynamics and normal LVEDP 3. Known severe LV systolic dysfunction Recommend: Medical therapy for CAD and cardiomyopathy. 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   ASSESSMENT/PLAN [name] is a 56 y.o. male with history of end-stage renal disease on HD, HTN, diabetes, and peripheral vascular disease presenting with left facial droop and slurred speech. He did not receive IV t-PA due to delay in arrival.   Stroke:  right MCA territory infarcts embolic secondary to unknown source  Resultant: Deficit resolved  Code stroke CT Right posterior insula and lentiform nucleus suggesting  acute ischemia.   MRI  multifocal acute on subacute right MCA territory infarcts.  MRA head  no LVO. Severe stenosis R M2 origin. Moderate stenosis R  M1 and L M2 origin  MRA neck negative  2D Echo  EF 20-25% with diffuse hypokinesis  LE venous dopplers no DVT  TEE with potential loop to look for embolic source planned for Monday   LDL 98  HgbA1c 5.0  Heparin 5000 units sq tid for VTE prophylaxis Diet heart healthy/carb modified Room service appropriate? Yes; Fluid consistency: Thin  clopidogrel 75 mg daily prior to admission, now on aspirin 81 mg daily and clopidogrel 75 mg daily. From stroke standpoint, there is no indication for Anticoagulation without identified embolic source.    Therapy recommendations:  No therapy needs  Disposition:  Return home  Cardiomyopathy  TTE EF 20-25%  hypertension versus prior infarct   TEE/loop recorder pending  CAD  Cardiac cath showed chronic multivessel CAD  On dual antiplatelet  Cardiology on board  Hypertension  Stable Permissive hypertension (OK if < 220/120) but gradually normalize in 5-7 days Long-term BP goal normotensive  Hyperlipidemia  Home meds:  No statin  LDL 98, goal < 70  Now on Lipitor 80 mg daily   Continue statin at discharge  Diabetes  HgbA1c 5.0, goal < 7.0  Controlled  Tobacco abuse  Current smoker  Smoking cessation counseling provided  Pt is willing to quit  Other Stroke Risk Factors  Morbid Obesity, Body mass index is 41.56 kg/m.  Peripheral vascular disease  Other Active Problems  ESRD on HD  Anemia   COPD  Hospital day # 4  Marvel Plan, MD PhD Stroke Neurology 06/25/2016 4:28 PM    To contact Stroke Continuity provider, please refer to [email or web address]. After hours, contact General Neurology

## 2016-06-25 NOTE — Progress Notes (Signed)
Subjective: No acute events overnight. Patient is doing well this morning. Patient would like to go home. Plans are to discharge him tomorrow after his TTE.   Objective:  Vital signs in last 24 hours: Vitals:   06/24/16 1457 06/24/16 1718 06/24/16 2151 06/25/16 0450  BP: 101/64 107/67 (!) 99/58 113/74  Pulse: 88 91 89 90  Resp: 18  18 18   Temp: 98.4 F (36.9 C) 98.1 F (36.7 C) 98.3 F (36.8 C) 97.9 F (36.6 C)  TempSrc: Oral Oral Oral Oral  SpO2: 97% 98% 96% 96%  Weight:   (!) 139 kg (306 lb 6.4 oz)   Height:   6' (1.829 m)    Physical Exam  Constitutional: He is oriented to person, place, and time. He appears well-developed and well-nourished.  Cardiovascular:  Distant heart sounds  Abdominal: Soft. Bowel sounds are normal. He exhibits no distension. There is no tenderness.  Neurological: He is alert and oriented to person, place, and time.  No facial droop   CBC Latest Ref Rng & Units 06/25/2016 06/24/2016 06/23/2016  WBC 4.0 - 10.5 K/uL 7.8 6.1 7.7  Hemoglobin 13.0 - 17.0 g/dL 1.4(E) 3.1(V) 4.0(G)  Hematocrit 39.0 - 52.0 % 29.6(L) 30.0(L) 31.4(L)  Platelets 150 - 400 K/uL 170 194 191   CBG (last 3)   Recent Labs  06/24/16 1334 06/24/16 1653 06/24/16 2220  GLUCAP 78 101* 92    Assessment/Plan:  Principal Problem:   CVA (cerebral vascular accident) (HCC) Active Problems:   Essential hypertension   Type 2 diabetes mellitus (HCC)   End stage renal disease (HCC)   Acute ischemic stroke (HCC)   Peripheral vascular disease (HCC)   Chronic combined systolic and diastolic congestive heart failure (HCC)   Nemanja Epp is a 56 y.o. male with a h/o ESRD on peritoneal dialysis, HTN, T2DM, HLD, possible COPD, and PVDwho presents to ED with left facial droop and slurred speech. CT, MRA MRI and HPI are all concerning for CVA of the MCA.  CVA Patient's stroke symptoms have resolved. He denies any numbness, tingling, headaches or vision changes. Suspect  stroke from stenosis found on MRA. Head MRA showed severe stenosis in right M2 origin and moderate stenosis in right M1 and left M2 origin. Patient will get a TEE tomorrow to rule out any sort of cardioembolic source. If negative will continue on ASA and Plavix. If positive for a source we consider anticoagulation.   -- TEE tomorrow -- cont Lipitor 80 mg  -- cont Clopidogrel + Plavix  HTN Current guidelines state that in the setting of acute ischemic stroke antihypertensive treatment is warranted in patients with systolic blood pressure greater than 220 mm Hg, receiving thrombolytic therapy, or with concomitant medical issues. Will allow for hypertension to perfuse penumbra and allow for perfusion around ischemic area.  Patient's blood pressure have been relatively low this admission. Blood pressure mostly being controlled by dialysis -- permissive hypertension with a goal of 220/110 -- cont holding homeMetolazone, Furosemide, Metoprolol   CHF TTE showed left EF of 20-25% and grade 1 diastolic dysfunctioned. Patient most likely has combined systolic and diastolic HF. Cardiac cath on 06/23/16 showed heavily calcified coronary arteries with chronic occlusion of the first OM branch of the circumflex, moderate diffuse RCA stenosis, and mild nonobstructive LAD stenosis. Was placed on Coreg by Cardiology. Patient's EF is 20-25%. Patients with nonischemic cardiomyopathy are at risk for SCD at [facility] lower or equal to 35%. May consider ICD placement.  --  cont Carvedilol 3.125 mg BID -- consider ICD   CKD Patient has ESRD 2/2 FSGS. Patient gets HD on Tuesday, Thursday, and Saturday.  Anemia Anemia most likely due to CKD.  -- EPO outpatient  COPD Unclear history of COPD. Patient was treated for COPD exacerbation on 05/19/2016 on outpatient.  -- Spiriva -- Albuterol nebulizer q6 hrs prn  DM A1C of 5.0 (06/22/2016) -- Sliding scale insulin -- Hemoglobin A1c  Fluids: None Diet: NPO @ MN DVT  Prophylaxis: SQH Code Status: FULL  Dispo: Anticipated discharge in approximately 0-1 day(s).   Caryn Bee, Medical Student 06/25/2016, 7:19 AM Pager: [phone]

## 2016-06-25 NOTE — Progress Notes (Signed)
Patient Name: Justin Fitzgerald Date of Encounter: 06/25/2016  Primary Cardiologist: [facility]- [facility] Problem List     Principal Problem:   CVA (cerebral vascular accident) [facility]) Active Problems:   Essential hypertension   Type 2 diabetes mellitus (HCC)   End stage renal disease (HCC)   Acute ischemic stroke (HCC)   Peripheral vascular disease (HCC)   Chronic combined systolic and diastolic congestive heart failure (HCC)     Subjective   56 yo with recent dx of chronic combined systolic and diastolic CHF Cath revealed a chronic total occlusion of OM.  This OM occlusion would not explain his global reduction in EF - likely has a cardiomyopathy due to HTN with a component of CAD   Scheduled for TEE tomorrow for CVA work up     Inpatient Medications    Scheduled Meds: . aspirin EC  81 mg Oral Daily  . atorvastatin  80 mg Oral q1800  . budesonide (PULMICORT) nebulizer solution  0.25 mg Nebulization BID  . carvedilol  3.125 mg Oral BID WC  . cinacalcet  120 mg Oral Q supper  . clopidogrel  75 mg Oral Daily  . gabapentin  300 mg Oral QHS  . heparin subcutaneous  5,000 Units Subcutaneous Q8H  . insulin aspart  0-5 Units Subcutaneous QHS  . insulin aspart  0-9 Units Subcutaneous TID WC  . sevelamer carbonate  2.4 g Oral TID WC  . sodium chloride flush  3 mL Intravenous Q12H  . tiotropium  18 mcg Inhalation Daily   Continuous Infusions:  PRN Meds: sodium chloride, acetaminophen, albuterol, heparin, ondansetron (ZOFRAN) IV, sodium chloride flush   Vital Signs    Vitals:   06/24/16 1718 06/24/16 2151 06/25/16 0450 06/25/16 0844  BP: 107/67 (!) 99/58 113/74 92/62  Pulse: 91 89 90 88  Resp:  18 18 20   Temp: 98.1 F (36.7 C) 98.3 F (36.8 C) 97.9 F (36.6 C) 98.6 F (37 C)  TempSrc: Oral Oral Oral Oral  SpO2: 98% 96% 96% 96%  Weight:  (!) 306 lb 6.4 oz (139 kg)    Height:  6' (1.829 m)      Intake/Output Summary (Last 24 hours) at 06/25/16  1124 Last data filed at 06/25/16 0700  Gross per 24 hour  Intake              960 ml  Output             2700 ml  Net            -1740 ml   Filed Weights   06/24/16 0856 06/24/16 1302 06/24/16 2151  Weight: (!) 318 lb 9 oz (144.5 kg) (!) 313 lb 0.9 oz (142 kg) (!) 306 lb 6.4 oz (139 kg)    Physical Exam   GEN: Well nourished, well developed, in no acute distress. Morbidly obese, appears older than stated age.  HEENT: Grossly normal.  Neck: Supple, no JVD, carotid bruits, or masses. Cardiac: RRR, soft systolic murmur , rubs, or gallops. No clubbing, cyanosis, edema.  Radials/DP/PT 2+ and equal bilaterally.  Respiratory:  Respirations regular and unlabored, clear to auscultation bilaterally. GI: Soft, nontender, nondistended, BS + x 4. MS: no deformity or atrophy. Skin: warm and dry, no rash. Neuro:  Strength and sensation are intact. Psych: AAOx3.  Normal affect.  Labs    CBC  Recent Labs  06/24/16 0357 06/25/16 0223  WBC 6.1 7.8  HGB 9.2* 9.1*  HCT 30.0* 29.6*  MCV 91.2 91.4  PLT 194 170   Basic Metabolic Panel  Recent Labs  06/23/16 0722 06/24/16 0932  NA 137 134*  K 3.9 3.8  CL 99* 97*  CO2 26 26  GLUCOSE 90 115*  BUN 19 25*  CREATININE 5.91* 7.43*  CALCIUM 9.1 8.4*  PHOS 5.1* 5.8*   Liver Function Tests  Recent Labs  06/23/16 0722 06/24/16 0932  ALBUMIN 3.1* 2.7*   No results for input(s): LIPASE, AMYLASE in the last 72 hours. Cardiac Enzymes No results for input(s): CKTOTAL, CKMB, CKMBINDEX, TROPONINI in the last 72 hours. BNP Invalid input(s): POCBNP D-Dimer No results for input(s): DDIMER in the last 72 hours. Hemoglobin A1C No results for input(s): HGBA1C in the last 72 hours. Fasting Lipid Panel No results for input(s): CHOL, HDL, LDLCALC, TRIG, CHOLHDL, LDLDIRECT in the last 72 hours. Thyroid Function Tests  Recent Labs  06/23/16 0722  TSH 2.302    Telemetry    Sinus to sinus tach - Personally Reviewed  ECG    No new ECG  - Personally Reviewed  Radiology    No results found.  Cardiac Studies   Echo 06/22/16 Study Conclusions - Left ventricle: The cavity size was moderately dilated. Wall thickness was increased in a pattern of moderate LVH. Systolic function was severely reduced. The estimated ejection fraction was in the range of 20% to 25%. Diffuse hypokinesis. Regional wall motion abnormalities cannot be excluded. Doppler parameters are consistent with abnormal left ventricular relaxation (grade 1 diastolic dysfunction). - Aortic valve: Valve mobility was mildly restricted. Transvalvular velocity was increased. There was mild stenosis. There was mild regurgitation. Valve area (VTI): 1.19 cm^2. Valve area (Vmax): 1.21 cm^2. Valve area (Vmean): 1.09 cm^2. - Aortic root: The aortic root was mildly dilated. - Mitral valve: Mildly to moderately calcified annulus. - Left atrium: The atrium was moderately dilated.   06/23/16 Procedures  Right/Left Heart Cath and Coronary Angiography  Conclusion  1. Heavily calcified coronary arteries with chronic occlusion of the first OM branch of the circumflex, moderate diffuse RCA stenosis, and mild nonobstructive LAD stenosis 2. Normal right heart hemodynamics and normal LVEDP 3. Known severe LV systolic dysfunction  Recommend: Medical therapy for CAD and cardiomyopathy. 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     Patient Profile     56 y.o. male with a PMHx of ESRD on HD, HTN, PVD, DM, morbid obesity and tobacco abuse who was admitted to [facility] on 2/7/2018for evaluation of CVA. During work up he was found to have severe LV dysfunction and cardiology consulted.   Assessment & Plan    Newly diagnosed LV dysfunction: he under went [facility] on Feb 9 which showed heavily calcified coronary arteries with chronic occlusion of the first OM branch of the circumflex, moderate diffuse RCA stenosis, and mild  nonobstructive LAD stenosis. No good targets for PCI and plan to treat medically. It was suspected that his cardiomyopathy was primarily nonischemic (likely hypertensive cardiomyopathy). Filling pressures on cath were normal. Plan for volume management per nephrology (HD). -- BP has been too soft to initiate CHF meds.   He is on low dose coreg.   HTN: BP has been soft.   Seems to be tolerating low dose coreg.  Consider adding ARB once his BP is higher  CAD: as above, L/RHC yesterday which showed heavily calcified coronary arteries with chronic occlusion of the first OM branch of the circumflex, moderate diffuse RCA stenosis, and mild nonobstructive LAD  stenosis. No good targets for PCI and plan to treat medically. Continue ASA, statin. Trying to see if he will tolerate a small dose of Coreg.   Tobacco abuse: counseled on cessation.   ESRD on HD:  Continue HD.   Further plans pernephrology   Morbid obesity: Body mass index is 41.56 kg/m. Diet and exercise recommended   DMT2: under excellent control. Hg A1c 5.0     Kristeen Miss, MD  06/25/2016 11:33 AM    [facility] [address] [place],  [address] [place], [place]  [address] Pager [phone] Phone: [phone]; Fax: [phone]

## 2016-06-26 ENCOUNTER — Encounter (HOSPITAL_COMMUNITY)
Admission: EM | Disposition: A | Discharge status: Home / Self Care | Payer: Self-pay | Source: Home / Self Care | Attending: Internal Medicine

## 2016-06-26 ENCOUNTER — Other Ambulatory Visit: Payer: Self-pay | Admitting: Nurse Practitioner

## 2016-06-26 ENCOUNTER — Inpatient Hospital Stay (HOSPITAL_COMMUNITY): Payer: Medicare Other | Admitting: Certified Registered Nurse Anesthetist

## 2016-06-26 ENCOUNTER — Encounter (HOSPITAL_COMMUNITY): Payer: Self-pay | Admitting: Cardiovascular Disease

## 2016-06-26 ENCOUNTER — Inpatient Hospital Stay (HOSPITAL_COMMUNITY)
Admit: 2016-06-26 | Discharge: 2016-06-26 | Disposition: A | Discharge status: Home / Self Care | Payer: Medicare Other | Attending: Internal Medicine | Admitting: Internal Medicine

## 2016-06-26 DIAGNOSIS — I639 Cerebral infarction, unspecified: Secondary | ICD-10-CM

## 2016-06-26 DIAGNOSIS — I6300 Cerebral infarction due to thrombosis of unspecified precerebral artery: Secondary | ICD-10-CM

## 2016-06-26 DIAGNOSIS — I5043 Acute on chronic combined systolic (congestive) and diastolic (congestive) heart failure: Secondary | ICD-10-CM

## 2016-06-26 HISTORY — PX: TEE WITHOUT CARDIOVERSION: SHX5443

## 2016-06-26 LAB — CBC
HCT: 27.9 % — ABNORMAL LOW (ref 39.0–52.0)
HEMOGLOBIN: 8.8 g/dL — AB (ref 13.0–17.0)
MCH: 28.7 pg (ref 26.0–34.0)
MCHC: 31.5 g/dL (ref 30.0–36.0)
MCV: 90.9 fL (ref 78.0–100.0)
PLATELETS: 193 10*3/uL (ref 150–400)
RBC: 3.07 MIL/uL — AB (ref 4.22–5.81)
RDW: 17.1 % — ABNORMAL HIGH (ref 11.5–15.5)
WBC: 7.6 10*3/uL (ref 4.0–10.5)

## 2016-06-26 LAB — GLUCOSE, CAPILLARY
GLUCOSE-CAPILLARY: 74 mg/dL (ref 65–99)
Glucose-Capillary: 130 mg/dL — ABNORMAL HIGH (ref 65–99)
Glucose-Capillary: 86 mg/dL (ref 65–99)
Glucose-Capillary: 85 mg/dL (ref 65–99)

## 2016-06-26 SURGERY — ECHOCARDIOGRAM, TRANSESOPHAGEAL
Anesthesia: Monitor Anesthesia Care

## 2016-06-26 MED ORDER — CARVEDILOL 3.125 MG PO TABS: 3.1250 mg | ORAL_TABLET | Freq: Two times a day (BID) | ORAL | 1 refills | Status: DC

## 2016-06-26 MED ORDER — PROPOFOL 500 MG/50ML IV EMUL
INTRAVENOUS | Status: DC | PRN
  Administered 2016-06-26: 100 ug/kg/min via INTRAVENOUS

## 2016-06-26 MED ORDER — ONDANSETRON HCL 4 MG/2ML IJ SOLN: 4.0000 mg | Freq: Once | INTRAMUSCULAR | Status: DC | PRN

## 2016-06-26 MED ORDER — STROKE: EARLY STAGES OF RECOVERY BOOK
Freq: Once | Status: AC
  Administered 2016-06-26: 17:00:00
  Filled 2016-06-26: qty 1

## 2016-06-26 MED ORDER — ASPIRIN 81 MG PO TBEC: 81.0000 mg | DELAYED_RELEASE_TABLET | Freq: Every day | ORAL | 3 refills | Status: AC

## 2016-06-26 MED ORDER — BUTAMBEN-TETRACAINE-BENZOCAINE 2-2-14 % EX AERO
INHALATION_SPRAY | CUTANEOUS | Status: DC | PRN
  Administered 2016-06-26: 2 via TOPICAL

## 2016-06-26 MED ORDER — PROPOFOL 10 MG/ML IV BOLUS
INTRAVENOUS | Status: DC | PRN
  Administered 2016-06-26: 20 mg via INTRAVENOUS

## 2016-06-26 MED ORDER — ATORVASTATIN CALCIUM 80 MG PO TABS: 80.0000 mg | ORAL_TABLET | Freq: Every day | ORAL | 1 refills | Status: AC

## 2016-06-26 MED ORDER — SODIUM CHLORIDE 0.9 % IV SOLN: INTRAVENOUS | Status: DC

## 2016-06-26 NOTE — Care Management Important Message (Signed)
Important Message  Patient Details  Name: Justin Fitzgerald MRN: [id number] Date of Birth: 1960-08-12   Medicare Important Message Given:       Dorena Bodo 06/26/2016, 4:38 PM

## 2016-06-26 NOTE — Progress Notes (Signed)
Occupational Therapy Treatment/Discharge Patient Details Name: Justin Fitzgerald MRN: [id number] DOB: 06-04-1960 Today's Date: 06/26/2016    History of present illness [name] is a 57 year old African-American gentleman with a medical history of end-stage renal disease, hypertension and diabetes who presented with left facial droop upon awakening and a CT scan concerning for acute infarct. MRI revealed Multifocal acute on subacute RIGHT MCA territory infarcts including RIGHT basal ganglia.   OT comments  Pt demonstrated decreased coordination in LUE as seen by poor alternating hand movements and finger-thumb test. Provided pt with handout on [facility] and established home exercise program. Pt verbalized and demonstrated understanding of exercises. Educated pt on stroke signs and symptoms through [employer] acronym; pt verbalized understanding. Pt safe to d/c home once medically stable per physician.     Follow Up Recommendations  No OT follow up    Equipment Recommendations  None recommended by OT    Recommendations for Other Services      Precautions / Restrictions Precautions Precautions: Fall Restrictions Weight Bearing Restrictions: No       Mobility Bed Mobility               General bed mobility comments: up in chair  Transfers                      Balance                                   ADL Overall ADL's : At baseline                                              Vision                     Perception     Praxis      Cognition   Behavior During Therapy: [facility] for tasks assessed/performed Overall Cognitive Status: Within Functional Limits for tasks assessed                  General Comments:  (Accepting of [facility] and stroke prevention education)    Extremity/Trunk Assessment               Exercises Other Exercises Other Exercises: Educated pt on activities to promote [facility] exercise of L  hand. (Provided [facility] handout and exstablished home exercise program)   Shoulder Instructions       General Comments      Pertinent Vitals/ Pain       Pain Assessment: Faces Faces Pain Scale: No hurt  Home Living                                          Prior Functioning/Environment              Frequency  Min 1X/week        Progress Toward Goals  OT Goals(current goals can now be found in the care plan section)     Acute Rehab OT Goals Patient Stated Goal: I'm ready to go home OT Goal Formulation: All assessment and education complete, DC therapy Time For Goal Achievement: 07/02/16 Potential to Achieve Goals: Good ADL Goals Pt/caregiver will Perform  Home Exercise Program: Independently;With written HEP provided (L hand [facility])  Plan Discharge plan remains appropriate    Co-evaluation                 End of Session     Activity Tolerance Patient tolerated treatment well   Patient Left in chair;with call bell/phone within reach   Nurse Communication Other (comment) (Home exercise program)        Time: 0981-1914 OT Time Calculation (min): 16 min  Charges: OT General Charges $OT Visit: 1 Procedure OT Treatments $Therapeutic Activity: 8-22 mins  Sayf Kerner M Terrilee Dudzik 06/26/2016, 9:15 AM  Curlene Dolphin, OTR/L [phone]

## 2016-06-26 NOTE — Care Management Note (Signed)
Case Management Note  Patient Details  Name: Justin Fitzgerald MRN: [id number] Date of Birth: [birth year]  Subjective/Objective:                 Patient admitted with CVA. Patient states he has no residual, walked with PT, no home follow up required, patient denies need for DME. Patient states that he will DC with ASA Plavix.    Action/Plan:  No CM needs identified at this time.   Expected Discharge Date:                  Expected Discharge Plan:  Home/Self Care  In-House Referral:     Discharge planning Services  CM Consult  Post Acute Care Choice:    Choice offered to:     DME Arranged:    DME Agency:     HH Arranged:    HH Agency:     Status of Service:  Completed, signed off  If discussed at [employer] of Stay Meetings, dates discussed:    Additional Comments:  Lawerance Sabal, RN 06/26/2016, 1:48 PM

## 2016-06-26 NOTE — Transfer of Care (Signed)
Immediate Anesthesia Transfer of Care Note  Patient: Justin Fitzgerald  Procedure(s) Performed: Procedure(s): TRANSESOPHAGEAL ECHOCARDIOGRAM (TEE) (N/A)  Patient Location: Endoscopy Unit  Anesthesia Type:MAC  Level of Consciousness: awake, alert  and oriented  Airway & Oxygen Therapy: Patient Spontanous Breathing and Patient connected to nasal cannula oxygen  Post-op Assessment: Report given to RN and Post -op Vital signs reviewed and stable  Post vital signs: Reviewed and stable  Last Vitals:  Vitals:   06/26/16 0855 06/26/16 0934  BP: 101/63 104/65  Pulse: 82 80  Resp: 18 16  Temp: 36.6 C 36.6 C    Last Pain:  Vitals:   06/26/16 0934  TempSrc: Oral  PainSc:          Complications: No apparent anesthesia complications

## 2016-06-26 NOTE — Interval H&P Note (Signed)
History and Physical Interval Note:  06/26/2016 8:45 AM  Justin Fitzgerald  has presented today for surgery, with the diagnosis of STROKE  The various methods of treatment have been discussed with the patient and family. After consideration of risks, benefits and other options for treatment, the patient has consented to  Procedure(s): TRANSESOPHAGEAL ECHOCARDIOGRAM (TEE) (N/A) as a surgical intervention .  The patient's history has been reviewed, patient examined, no change in status, stable for surgery.  I have reviewed the patient's chart and labs.  Questions were answered to the patient's satisfaction.     Dietrich Pates

## 2016-06-26 NOTE — Progress Notes (Signed)
STROKE TEAM PROGRESS NOTE   SUBJECTIVE (INTERVAL HISTORY) No family is at bedside. No new complaints. TEE Done today without complications. Patient is pending for discharge. EP recommended 30 day cardiac event monitoring due to low EF.   PHYSICAL EXAM Temp:  [97.8 F (36.6 C)-98.5 F (36.9 C)] 97.8 F (36.6 C) (02/12 1706) Pulse Rate:  [78-94] 78 (02/12 1706) Cardiac Rhythm: Normal sinus rhythm (02/12 1300) Resp:  [16-20] 18 (02/12 1706) BP: (84-112)/(58-73) 112/73 (02/12 1706) SpO2:  [97 %-100 %] 100 % (02/12 1706)  Obese middle-aged [place] American male   . Not in distress.2 plus pedal edema bilaterally.  . Afebrile. Head is nontraumatic. Neck is supple without bruit.    Cardiac exam no murmur or gallop. Lungs are clear to auscultation. Distal pulses are well felt. Neurological Exam :  Awake alert oriented x 3 normal speech and language.No face asymmetry. Tongue midline. No drift. Mild diminished fine finger movements on left. Orbits right over left upper extremity. Mild left grip weak.. Normal sensation . Normal coordination.  Imaging studies I have personally reviewed the radiological images below and agree with the radiology interpretations.  Mri and Mra Head and neck Wo Contrast 06/21/2016 IMPRESSION: MRI HEAD: Mildly motion degraded examination. Multifocal acute on subacute RIGHT MCA territory infarcts including RIGHT basal ganglia. Mild to moderate white matter changes compatible chronic small vessel ischemic disease. MRA HEAD: Moderately motion degraded examination. No emergent large vessel occlusion. Severe stenosis RIGHT M2 origin. Moderate stenosis RIGHT M1 and LEFT M2 origin. MRA NECK: Negative noncontrast moderately motion degraded examination.   Ct Head Code Stroke W/o Cm 06/21/2016 IMPRESSION: 1. Subtle loss of gray-white differentiation in the posterior right insula and lentiform nucleus suggesting acute ischemia. 2. Chronic bilateral maxillary sinus disease 3. ASPECTS is  8/10   LE venous Doppler - no DVT  TTE -  - Left ventricle: The cavity size was moderately dilated. Wall   thickness was increased in a pattern of moderate LVH. Systolic   function was severely reduced. The estimated ejection fraction   was in the range of 20% to 25%. Diffuse hypokinesis. Regional   wall motion abnormalities cannot be excluded. Doppler parameters   are consistent with abnormal left ventricular relaxation (grade 1   diastolic dysfunction). - Aortic valve: Valve mobility was mildly restricted. Transvalvular   velocity was increased. There was mild stenosis. There was mild   regurgitation. Valve area (VTI): 1.19 cm^2. Valve area (Vmax):   1.21 cm^2. Valve area (Vmean): 1.09 cm^2. - Aortic root: The aortic root was mildly dilated. - Mitral valve: Mildly to moderately calcified annulus. - Left atrium: The atrium was moderately dilated.  Cardiac cath 1. Heavily calcified coronary arteries with chronic occlusion of the first OM branch of the circumflex, moderate diffuse RCA stenosis, and mild nonobstructive LAD stenosis 2. Normal right heart hemodynamics and normal LVEDP 3. Known severe LV systolic dysfunction Recommend: Medical therapy for CAD and cardiomyopathy. 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  TEE - Aortic valve: AV is thickened, calcified with moderately   restricted motion. Cannot accurately assess gradients across   valve. - Aorta: There is a walled off regiion next to aorta between left   and noncoronary cusps of aortic valve that probably represents   remote abscess. There does not appear to be a communication with   the aorta. Would recomm cardiac CT (elective) to evaluate   further.   There is fixed atherosclerotic plaquing of the thoracic  aorta. - Left atrium: No evidence of thrombus in the atrial cavity or   appendage. - Right atrium: No evidence of thrombus in the atrial cavity or   appendage. -  Atrial septum: NO PFO by color doppler or as tested with   injection of agitated saline - Pericardium, extracardiac: A small pericardial effusion was   identified.   ASSESSMENT/PLAN [name] is a 56 y.o. male with history of end-stage renal disease on HD, HTN, diabetes, and peripheral vascular disease presenting with left facial droop and slurred speech. He did not receive IV t-PA due to delay in arrival.   Stroke:  right MCA territory infarcts embolic secondary to unknown source  Resultant: Deficit resolved  Code stroke CT Right posterior insula and lentiform nucleus suggesting acute ischemia.   MRI  multifocal acute on subacute right MCA territory infarcts.  MRA head  no LVO. Severe stenosis R M2 origin. 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 Fib instead of loop recorder   LDL 98  HgbA1c 5.0  Heparin 5000 units sq tid for VTE prophylaxis Diet Heart Room service appropriate? Yes; Fluid consistency: Thin Diet - low sodium heart healthy  clopidogrel 75 mg daily prior to admission, now on aspirin 81 mg daily and clopidogrel 75 mg daily. Continue dual antiplatelet on discharge. As per Dr. Pearlean Brownie, there is no indication for Anticoagulation without identified embolic source.    Therapy recommendations:  No therapy needs  Disposition:  Return home  Patient is to follow with Dr. 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 Pearlean Brownie -  there is no indication for Anticoagulation without identified  embolic source.   CAD  Cardiac cath showed chronic multivessel CAD  On dual antiplatelet  Cardiology on board  Hypertension  Stable Permissive hypertension (OK if < 220/120) but gradually normalize in 5-7 days Long-term BP goal normotensive  Hyperlipidemia  Home meds:  No statin  LDL 98, goal < 70  Now on Lipitor 80 mg daily   Continue statin at discharge  Diabetes  HgbA1c 5.0, goal < 7.0  Controlled  Tobacco abuse  Current smoker  Smoking cessation counseling provided  Pt is willing to quit  Other Stroke Risk Factors  Morbid Obesity, Body mass index is 41.56 kg/m.  Peripheral vascular disease  Other Active Problems  ESRD on HD  Anemia   COPD  Hospital day # 5  Neurology will sign off. Please call with questions. Pt will follow up with Dr. Pearlean Brownie at [facility] in about 6 weeks. Thanks for the consult.   Marvel Plan, MD PhD Stroke Neurology 06/26/2016 7:04 PM    To contact Stroke Continuity provider, please refer to [email or web address]. After hours, contact General Neurology

## 2016-06-26 NOTE — Progress Notes (Signed)
Internal Medicine Attending  Date: 06/26/2016  Patient name: Justin Fitzgerald Medical record number: [id number] Date of birth: 03-Apr-1961 Age: 56 y.o. Gender: male  I saw and evaluated the patient. I reviewed the resident's note by Dr. Ladona Ridgel and I agree with the resident's findings and plans as documented in his progress note.  When seen on rounds this morning Ms. Clary was without complaints and looking forward to getting the TEE done so he could presumably go home today. 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 His TEE was without a thrombus to suggest his stroke was cardioembolic in nature. 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 There did not appear to be communication with the aorta and the cardiologist recommended a elective cardiac CT to evaluate this further. We will make sure this recommendation is highlighted in the discharge summary sent to Dr. Terance Hart. It also should be noted that the electrophysiology consult service recommended a 30 day event monitor as an outpatient to rule out atrial fibrillation and this too will be highlighted in the discharge summary sent to his PCP to arrange. If this failed to demonstrate atrial fibrillation then one could consider an implantable loop recorder. As he has no cardioembolic source he will be managed on dual antiplatelet therapy and discharged home today.

## 2016-06-26 NOTE — Progress Notes (Signed)
  Echocardiogram Echocardiogram Transesophageal has been performed.  Delcie Roch 06/26/2016, 11:34 AM

## 2016-06-26 NOTE — Consult Note (Signed)
ELECTROPHYSIOLOGY CONSULT NOTE  Patient ID: Justin Fitzgerald MRN: [id number], DOB/AGE: [birth year]   Admit date: 06/21/2016 Date of Consult: 06/26/2016  Primary Physician: Dorothey Baseman, MD Primary Cardiologist: Nahser - new this admission, but would like to follow in [facility] Reason for Consultation: Cryptogenic stroke; recommendations regarding Implantable Loop Recorder  History of Present Illness Justin Fitzgerald was admitted on 06/21/2016 with slurred speech and left hand weakness.  They first developed symptoms while at home the day of admission.  Imaging demonstrated right MCA territory infarct felt to be embolic 2/2 unknown source.  He has undergone workup for stroke including echocardiogram and carotid dopplers.  The patient has been monitored on telemetry which has demonstrated sinus rhythm with NSVT.  Inpatient stroke work-up is to be completed with a TEE.   Echocardiogram this admission demonstrated EF 20-25%, moderate LVH, mild AS, LA 39.  Lab work is reviewed.  Prior to admission, the patient denies chest pain, shortness of breath, dizziness, palpitations, or syncope.  They are recovering from their stroke with plans to return home at discharge.  EP has been asked to evaluate for placement of an implantable loop recorder to monitor for atrial fibrillation.  Past Medical History:  Diagnosis Date  . ESRD (end stage renal disease) on dialysis (HCC)    "TTS; [facility] [place]" (06/22/2016)  . History of gout   . Hyperlipidemia   . Hypertension   . Peripheral vascular disease (HCC)   . Stroke (HCC) 06/20/2016   denies residual on 06/22/2016  . Type II diabetes mellitus (HCC)      Surgical History:  Past Surgical History:  Procedure Laterality Date  . EXTERIORIZATION OF A CONTINUOUS AMBULATORY PERITONEAL DIALYSIS CATHETER    . FRACTURE SURGERY    . PERIPHERAL VASCULAR CATHETERIZATION N/A 03/08/2016   Procedure: Dialysis/Perma Catheter Insertion;  Surgeon:  Renford Dills, MD;  Location: [facility] INVASIVE CV LAB;  Service: Cardiovascular;  Laterality: N/A;  . PERIPHERAL VASCULAR CATHETERIZATION N/A 04/20/2016   Procedure: Dialysis/Perma Catheter Removal;  Surgeon: Annice Needy, MD;  Location: [facility] INVASIVE CV LAB;  Service: Cardiovascular;  Laterality: N/A;  . PERIPHERAL VASCULAR CATHETERIZATION N/A 05/25/2016   Procedure: Dialysis/Perma Catheter Insertion;  Surgeon: Annice Needy, MD;  Location: [facility] INVASIVE CV LAB;  Service: Cardiovascular;  Laterality: N/A;  . RIGHT/LEFT HEART CATH AND CORONARY ANGIOGRAPHY N/A 06/23/2016   Procedure: Right/Left Heart Cath and Coronary Angiography;  Surgeon: [name], MD;  Location: [facility] INVASIVE CV LAB;  Service: Cardiovascular;  Laterality: N/A;  . WRIST FRACTURE SURGERY Right ~ 2000   "got 8 screws in there"     Prescriptions Prior to Admission  Medication Sig Dispense Refill Last Dose  . cinacalcet (SENSIPAR) 30 MG tablet Take 4 tablets (120 mg total) by mouth daily with supper. 120 tablet 0 06/20/2016 at Unknown time  . clopidogrel (PLAVIX) 75 MG tablet Take 75 mg by mouth daily.   06/20/2016 at Unknown time  . furosemide (LASIX) 80 MG tablet Take 160 mg by mouth 2 (two) times daily.   06/20/2016 at Unknown time  . gabapentin (NEURONTIN) 300 MG capsule Take 300 mg by mouth at bedtime.    06/20/2016 at Unknown time  . sevelamer carbonate (RENVELA) 2.4 g PACK Take 2.4 g by mouth 3 (three) times daily with meals. 90 each 0 06/20/2016 at Unknown time  . SPIRIVA HANDIHALER 18 MCG inhalation capsule Place 18 mcg into inhaler and inhale daily.    06/20/2016 at Unknown time  .  beclomethasone (QVAR) 40 MCG/ACT inhaler Inhale 1 puff into the lungs 2 (two) times daily. (Patient not taking: Reported on 06/21/2016) 1 Inhaler 0 Not Taking at Unknown time    Inpatient Medications:  . [MAR Hold] aspirin EC  81 mg Oral Daily  . [MAR Hold] atorvastatin  80 mg Oral q1800  . [MAR Hold] budesonide (PULMICORT) nebulizer solution  0.25 mg  Nebulization BID  . [MAR Hold] carvedilol  3.125 mg Oral BID WC  . [MAR Hold] cinacalcet  120 mg Oral Q supper  . [MAR Hold] clopidogrel  75 mg Oral Daily  . [MAR Hold] gabapentin  300 mg Oral QHS  . [MAR Hold] heparin subcutaneous  5,000 Units Subcutaneous Q8H  . [MAR Hold] insulin aspart  0-5 Units Subcutaneous QHS  . [MAR Hold] insulin aspart  0-9 Units Subcutaneous TID WC  . [MAR Hold] sevelamer carbonate  2.4 g Oral TID WC  . [MAR Hold] sodium chloride flush  3 mL Intravenous Q12H  . [MAR Hold] tiotropium  18 mcg Inhalation Daily    Allergies:  Allergies  Allergen Reactions  . Ramipril Shortness Of Breath  . Aspirin     kidney disease    Social History   Social History  . Marital status: Married    Spouse name: N/A  . Number of children: N/A  . Years of education: N/A   Occupational History  . Not on file.   Social History Main Topics  . Smoking status: Current Every Day Smoker    Packs/day: 0.50    Years: 40.00    Types: Cigarettes  . Smokeless tobacco: Former User    Types: Chew     Comment: 06/22/2016 "quit chewing when I was 35 or 56 years old"  . Alcohol use No  . Drug use: No  . Sexual activity: Not Currently   Other Topics Concern  . Not on file   Social History Narrative  . No narrative on file     Family History  Problem Relation Age of Onset  . Hypertension Mother   . CAD Mother       Review of Systems: All other systems reviewed and are otherwise negative except as noted above.  Physical Exam: Vitals:   06/26/16 0440 06/26/16 0835 06/26/16 0855 06/26/16 0934  BP: 108/60  101/63 104/65  Pulse: 94  82 80  Resp: 18  18 16   Temp: 98.5 F (36.9 C)  97.8 F (36.6 C) 97.8 F (36.6 C)  TempSrc: Oral  Oral Oral  SpO2: 98% 97% 99% 98%  Weight:      Height:        GEN- The patient is obese appearing, alert and oriented x 3 today.   Head- normocephalic, atraumatic Eyes-  Sclera clear, conjunctiva pink Ears- hearing intact Oropharynx-  clear Neck- supple Lungs- Clear to ausculation bilaterally, normal work of breathing Heart- Regular rate and rhythm  GI- soft, NT, ND, + BS Extremities- no clubbing, cyanosis, or edema MS- no significant deformity or atrophy Skin- no rash or lesion Psych- euthymic mood, full affect   Labs:   Lab Results  Component Value Date   WBC 7.6 06/26/2016   HGB 8.8 (L) 06/26/2016   HCT 27.9 (L) 06/26/2016   MCV 90.9 06/26/2016   PLT 193 06/26/2016    Recent Labs Lab 06/21/16 1205  06/24/16 0932  NA 137  < > 134*  K 3.6  < > 3.8  CL 99*  < > 97*  CO2 25  < >  26  BUN 24*  < > 25*  CREATININE 6.68*  < > 7.43*  CALCIUM 8.9  < > 8.4*  PROT 6.7  --   --   BILITOT 0.4  --   --   ALKPHOS 101  --   --   ALT 14*  --   --   AST 19  --   --   GLUCOSE 98  < > 115*  < > = values in this interval not displayed.   Radiology/Studies: [name] ZO Contrast Result Date: 06/21/2016 CLINICAL DATA:  LEFT facial droop and slurred speech beginning this morning. LEFT hand weakness. Follow-up code stroke. History of end-stage on dialysis, hypertension, hyperlipidemia and diabetes. EXAM: MR HEAD WITHOUT CONTRAST MR CIRCLE OF WILLIS WITHOUT CONTRAST MRA OF THE NECK WITHOUT CONTRAST TECHNIQUE: Multiplanar, multiecho pulse sequences of the brain, circle of Willis and surrounding structures were obtained without intravenous contrast. Angiographic images of the neck were obtained using MRA technique without intravenous contrast. COMPARISON:  CT HEAD June 21, 2016 at 1141 hours FINDINGS: MR HEAD FINDINGS- multiple sequences are mildly motion degraded. BRAIN: Subcentimeter focus of reduced diffusion RIGHT frontal and parietal lobes with low ADC values. Patchy reduced diffusion RIGHT basal ganglia and insula with normalizing ADC values in intermediate to bright FLAIR T2 hyperintense signal. No susceptibility artifact to suggest hemorrhage. The ventricles and sulci are normal for patient's age. Greater than expected  scattered subcentimeter supratentorial white matter FLAIR T2 hyperintensities, exclusive of the aforementioned abnormality. No mass effect. No abnormal extra-axial fluid collections. VASCULAR: Normal major intracranial vascular flow voids present at skull base. SKULL AND UPPER CERVICAL SPINE: No abnormal sellar expansion. No suspicious calvarial bone marrow signal. Craniocervical junction maintained. SINUSES/ORBITS: Mild paranasal sinus mucosal thickening. Mastoid air cells are well aerated. The included ocular globes and orbital contents are non-suspicious. OTHER: None. MR CIRCLE OF WILLIS FINDINGS- moderately motion degraded examination . ANTERIOR CIRCULATION: Normal flow related enhancement of the included cervical, petrous, cavernous and supraclinoid internal carotid arteries. Patent anterior communicating artery. Flow related enhancement of the anterior and middle cerebral arteries, including distal segments. Moderate stenosis distal RIGHT M1 and severe RIGHT M2 origins. Moderate stenosis proximal LEFT M2 origin, superior division. No large vessel occlusion, abnormal luminal irregularity, aneurysm. POSTERIOR CIRCULATION: LEFT vertebral artery is dominant. Basilar artery is patent, with normal flow related enhancement of the main branch vessels. Mild luminal irregularity bilateral vertebral arteries compatible with atherosclerosis. Normal flow related enhancement of the posterior cerebral arteries. No large vessel occlusion, high-grade stenosis, abnormal luminal irregularity, aneurysm. ANATOMIC VARIANTS: None. MRA NECK FINDINGS- limited noncontrast, moderately motion degraded examination. ANTERIOR CIRCULATION: The common carotid arteries are widely patent bilaterally origins not imaged due to time-of-flight technique. The carotid bifurcation is patent bilaterally and there is no hemodynamically significant carotid stenosis by NASCET criteria. No evidence for atherosclerosis or flow limiting stenosis of the  cervical internal carotid arteries. POSTERIOR CIRCULATION: Bilateral vertebral arteries are patent to the vertebrobasilar junction, origins not imaged. No evidence for atherosclerosis or flow limiting stenosis. Source images and MIP image were reviewed. IMPRESSION: MRI HEAD: Mildly motion degraded examination. Multifocal acute on subacute RIGHT MCA territory infarcts including RIGHT basal ganglia. Mild to moderate white matter changes compatible chronic small vessel ischemic disease. MRA HEAD: Moderately motion degraded examination. No emergent large vessel occlusion. Severe stenosis RIGHT M2 origin. Moderate stenosis RIGHT M1 and LEFT M2 origin. MRA NECK: Negative noncontrast moderately motion degraded examination. Electronically Signed   By: Michel Santee.D.  On: 06/21/2016 21:18   12-lead ECG sinus rhythm All prior EKG's in EPIC reviewed with no documented atrial fibrillation  Telemetry sinus rhythm, NSVT  Assessment and Plan:  1. Cryptogenic stroke The patient presents with cryptogenic stroke.  The patient has a TEE planned for this AM.  I spoke at length with the patient about monitoring for afib with either a 30 day event monitor or an implantable loop recorder.  Because of newly identified LV dysfunction, I have recommended 30 day monitor at discharge.  If no AF identified, could consider ILR implant as an outpatient if no plans for ICD after medical optimization.  2.  Newly diagnosed LV dysfunction Continue medical therapy Consider ischemic eval as outpatient Management per general cardiology Would repeat echo after 3 months of optimized medical therapy There is limited data of benefit of ICD's in ESRD patients.   3.  NSVT Asymptomatic Uptitrate BB as able  4.  ESRD On HD Management per renal  5.  Obesity Body mass index is 41.56 kg/m. Weight loss recommended   Dr Ladona Ridgel to see later today.    Gypsy Balsam, NP 06/26/2016 9:39 AM  EP Attending  Patient seen and  examined. Agree with above. He prefers to wear a cardiac monitor for 30 days. If no evidence of atrial fib, would consider placing an ILR for cryptogenic stroke. He will continue his beta blocker for NSVT. He is encouraged to lose weight. With regard to his LV function, he will need to be on to after load reduction and a beta blocker.  Leonia Reeves.D.

## 2016-06-26 NOTE — Progress Notes (Signed)
Progress Note  Patient Name: Justin Fitzgerald Date of Encounter: 06/26/2016  Primary Cardiologist: New to Dr. Elease Hashimoto (Pt. Lives in [place] and will be followed there)  Subjective   Feeling well. No chest pain, sob or palpitations. TEE today. Wants to go home afterwards.   Inpatient Medications    Scheduled Meds: . aspirin EC  81 mg Oral Daily  . atorvastatin  80 mg Oral q1800  . budesonide (PULMICORT) nebulizer solution  0.25 mg Nebulization BID  . carvedilol  3.125 mg Oral BID WC  . cinacalcet  120 mg Oral Q supper  . clopidogrel  75 mg Oral Daily  . gabapentin  300 mg Oral QHS  . heparin subcutaneous  5,000 Units Subcutaneous Q8H  . insulin aspart  0-5 Units Subcutaneous QHS  . insulin aspart  0-9 Units Subcutaneous TID WC  . sevelamer carbonate  2.4 g Oral TID WC  . sodium chloride flush  3 mL Intravenous Q12H  . tiotropium  18 mcg Inhalation Daily   Continuous Infusions:  PRN Meds: sodium chloride, acetaminophen, albuterol, heparin, ondansetron (ZOFRAN) IV, sodium chloride flush   Vital Signs    Vitals:   06/25/16 2028 06/26/16 0440 06/26/16 0835 06/26/16 0855  BP: 103/69 108/60  101/63  Pulse: 90 94  82  Resp:  18  18  Temp: 98.5 F (36.9 C) 98.5 F (36.9 C)  97.8 F (36.6 C)  TempSrc: Oral Oral  Oral  SpO2: 97% 98% 97% 99%  Weight:      Height:        Intake/Output Summary (Last 24 hours) at 06/26/16 0911 Last data filed at 06/26/16 0649  Gross per 24 hour  Intake              480 ml  Output              200 ml  Net              280 ml   Filed Weights   06/24/16 0856 06/24/16 1302 06/24/16 2151  Weight: (!) 318 lb 9 oz (144.5 kg) (!) 313 lb 0.9 oz (142 kg) (!) 306 lb 6.4 oz (139 kg)    Telemetry    NSR - Personally Reviewed  ECG    N/A  Physical Exam   GEN: No acute distress.   Neck: No JVD Cardiac: RRR, no murmurs, rubs, or gallops.  Respiratory: Clear to auscultation bilaterally. GI: Soft, nontender, non-distended  MS:  No edema; No deformity. Neuro:  Nonfocal  Psych: Normal affect   Labs    Chemistry Recent Labs Lab 06/21/16 1205  06/22/16 1422 06/23/16 0722 06/24/16 0932  NA 137  < > 137 137 134*  K 3.6  < > 3.7 3.9 3.8  CL 99*  < > 96* 99* 97*  CO2 25  --  26 26 26   GLUCOSE 98  < > 106* 90 115*  BUN 24*  < > 32* 19 25*  CREATININE 6.68*  < > 8.06* 5.91* 7.43*  CALCIUM 8.9  --  8.7* 9.1 8.4*  PROT 6.7  --   --   --   --   ALBUMIN 2.9*  --  2.7* 3.1* 2.7*  AST 19  --   --   --   --   ALT 14*  --   --   --   --   ALKPHOS 101  --   --   --   --   BILITOT 0.4  --   --   --   --  GFRNONAA 8*  --  7* 10* 7*  GFRAA 10*  --  8* 11* 8*  ANIONGAP 13  --  15 12 11   < > = values in this interval not displayed.   Hematology Recent Labs Lab 06/24/16 0357 06/25/16 0223 06/26/16 0527  WBC 6.1 7.8 7.6  RBC 3.29* 3.24* 3.07*  HGB 9.2* 9.1* 8.8*  HCT 30.0* 29.6* 27.9*  MCV 91.2 91.4 90.9  MCH 28.0 28.1 28.7  MCHC 30.7 30.7 31.5  RDW 17.1* 16.7* 17.1*  PLT 194 170 193    Cardiac EnzymesNo results for input(s): TROPONINI in the last 168 hours.  Recent Labs Lab 06/21/16 1221  TROPIPOC 0.07     BNPNo results for input(s): BNP, PROBNP in the last 168 hours.   DDimer No results for input(s): DDIMER in the last 168 hours.   Radiology    No results found.  Cardiac Studies   Echo 06/22/16 Study Conclusions - Left ventricle: The cavity size was moderately dilated. Wall thickness was increased in a pattern of moderate LVH. Systolic function was severely reduced. The estimated ejection fraction was in the range of 20% to 25%. Diffuse hypokinesis. Regional wall motion abnormalities cannot be excluded. Doppler parameters are consistent with abnormal left ventricular relaxation (grade 1 diastolic dysfunction). - Aortic valve: Valve mobility was mildly restricted. Transvalvular velocity was increased. There was mild stenosis. There was mild regurgitation. Valve area (VTI):  1.19 cm^2. Valve area (Vmax): 1.21 cm^2. Valve area (Vmean): 1.09 cm^2. - Aortic root: The aortic root was mildly dilated. - Mitral valve: Mildly to moderately calcified annulus. - Left atrium: The atrium was moderately dilated.   06/23/16 Procedures  Right/Left Heart Cath and Coronary Angiography  Conclusion  1. Heavily calcified coronary arteries with chronic occlusion of the first OM branch of the circumflex, moderate diffuse RCA stenosis, and mild nonobstructive LAD stenosis 2. Normal right heart hemodynamics and normal LVEDP 3. Known severe LV systolic dysfunction  Recommend: Medical therapy for CAD and cardiomyopathy. 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     Patient Profile     56 y.o.malewith a PMHx of ESRD on HD, HTN, PVD, DM, morbid obesity and tobacco abuse who was [facility] to [facility] on 2/7/2018for evaluation of CVA. During work up he was found to have severe LV dysfunction and cardiology consulted.   Assessment & Plan    1. Newly diagnosed LV dysfunction: he under went [facility] on Feb 9 which showed heavily calcified coronary arteries with chronic occlusion of the first OM branch of the circumflex, moderate diffuse RCA stenosis, and mild nonobstructive LAD stenosis. No good targets for PCI and plan to treat medically.  - Likely his cardiomyopathy was primarily nonischemic (likely hypertensive cardiomyopathy). Filling pressures on cath were normal.  -- Tolerating Low dose BB. BP still soft. Volume status ok. Management per nephrology (HD).  2. HTN: BP remains soft.  Consider adding ARB once his BP is higher, likely as outpatient.   3. CAD: as above, L/RHC  showed heavily calcified coronary arteries with chronic occlusion of the first OM branch of the circumflex, moderate diffuse RCA stenosis, and mild nonobstructive LAD stenosis. No good targets for PCI and plan to treat medically. Continue ASA, statin and coreg.   4.  Tobacco abuse: counseled on cessation.   5. ESRD on HD:  Continue HD.   Further plans pernephrology   6. Morbid obesity: Body mass index is 41.56 kg/m. Diet and exercise recommended  7. DMT2: under excellent control. Hg A1c 5.0  8. CVA - TEE today.   Pt will be followed at [facility] office.   Lorelei Pont, PA  06/26/2016, 9:11 AM    Patient examined chart reviewed. Obese clear lungs no murmur For TEE today  Outpatient f/u cardiology arranged in [place] ok to d/c after TEE today from Our stand point  [employer]

## 2016-06-26 NOTE — Anesthesia Postprocedure Evaluation (Signed)
Anesthesia Post Note  Patient: Justin Fitzgerald  Procedure(s) Performed: Procedure(s) (LRB): TRANSESOPHAGEAL ECHOCARDIOGRAM (TEE) (N/A)  Patient location during evaluation: Endoscopy Anesthesia Type: MAC Level of consciousness: awake and alert and oriented Pain management: pain level controlled Vital Signs Assessment: post-procedure vital signs reviewed and stable Respiratory status: spontaneous breathing, nonlabored ventilation and respiratory function stable Cardiovascular status: blood pressure returned to baseline and stable Postop Assessment: no signs of nausea or vomiting Anesthetic complications: no       Last Vitals:  Vitals:   06/26/16 1108 06/26/16 1110  BP: (!) 84/58 101/62  Pulse: 81 81  Resp: 16 20  Temp: 36.6 C     Last Pain:  Vitals:   06/26/16 1108  TempSrc: Oral  PainSc:                  Jaiyon Wander A.

## 2016-06-26 NOTE — Progress Notes (Signed)
Given to patient booklet of '' Stroke ,Mapping Early Recovery and Living with a Stroke''.Encourage patient to read the book.Disharged papers printed and explained to the patient,included are factors that increased a chance of another stroke like obesity ,smoking and sedentary lives. Symptoms of stroke like weakness and loss feeling on the face or arms,trouble walking,loss of balance,troubled speaking and feeling confuse.Advised patient to seek immediate medical help or call 911 if he feel these symptoms again .Emphazised on his medicines compliances.Reminded patient of his medical appointment.

## 2016-06-26 NOTE — Progress Notes (Signed)
Subjective: No acute events overnight. TEE today. Patient is doing well and would like to go home. Will discharge home today pending TEE results.   Objective:  Vital signs in last 24 hours: Vitals:   06/26/16 0440 06/26/16 0835 06/26/16 0855 06/26/16 0934  BP: 108/60  101/63 104/65  Pulse: 94  82 80  Resp: 18  18 16   Temp: 98.5 F (36.9 C)  97.8 F (36.6 C) 97.8 F (36.6 C)  TempSrc: Oral  Oral Oral  SpO2: 98% 97% 99% 98%  Weight:      Height:       Physical Exam  Constitutional: He is oriented to person, place, and time. He appears well-developed and well-nourished.  Cardiovascular:  Distant heart sounds  Pulmonary/Chest: Effort normal and breath sounds normal.  Neurological: He is alert and oriented to person, place, and time.  No facial droop    CBC Latest Ref Rng & Units 06/26/2016 06/25/2016 06/24/2016  WBC 4.0 - 10.5 K/uL 7.6 7.8 6.1  Hemoglobin 13.0 - 17.0 g/dL 6.8(T) 1.5(B) 2.6(O)  Hematocrit 39.0 - 52.0 % 27.9(L) 29.6(L) 30.0(L)  Platelets 150 - 400 K/uL 193 170 194   CBG (last 3)   Recent Labs  06/25/16 1708 06/25/16 2033 06/26/16 0801  GLUCAP 95 90 86    Assessment/Plan:  Principal Problem:   CVA (cerebral vascular accident) (HCC) Active Problems:   Essential hypertension   Type 2 diabetes mellitus (HCC)   End stage renal disease (HCC)   Acute ischemic stroke (HCC)   Peripheral vascular disease (HCC)   Chronic combined systolic and diastolic congestive heart failure (HCC)   Justin Fitzgerald is a 56 y.o. male with a h/o ESRD on peritoneal dialysis, HTN, T2DM, HLD, possible COPD, and PVDwho presents to ED with left facial droop and slurred speech. CT, MRA MRI and HPI are all concerning for CVA of the MCA.  CVA Patient's stroke symptoms have resolved. He denies any numbness, tingling, headaches or vision changes. Suspect stroke from stenosis found on MRA. Head MRA showed severe stenosis in right M2 origin and moderate stenosis in right M1  and left M2 origin. Patient will get a TEE today to rule out any sort of cardioembolic source. If negative will continue on ASA and Plavix. If positive for a source we consider anticoagulation.   -- TEE today -- cont Lipitor 80 mg  -- cont ASA + Plavix  HTN Current guidelines state that in the setting of acute ischemic stroke antihypertensive treatment is warranted in patients with systolic blood pressure greater than 220 mm Hg, receiving thrombolytic therapy, or with concomitant medical issues. Will allow for hypertension to perfuse penumbra and allow for perfusion around ischemic area.  Patient's blood pressure have been relatively low this admission. Blood pressure mostly being controlled by dialysis -- permissive hypertension with a goal of 220/110 -- cont holdinghomeMetolazone, Furosemide, Metoprolol   CHF TTE showed left EF of 20-25% and grade 1 diastolic dysfunctioned. Patient most likely has combined systolic and diastolic HF. Cardiac cath on 06/23/16 showed heavily calcified coronary arteries with chronic occlusion of the first OM branch of the circumflex, moderate diffuse RCA stenosis, and mild nonobstructive LAD stenosis. Was placed on Coreg by Cardiology. Patient's EF is 20-25%. Patients with nonischemic cardiomyopathy are at risk for SCD at [facility] lower or equal to 35%. May consider ICD placement.  -- cont Carvedilol 3.125 mg BID -- consider ICD   CKD Patient has ESRD 2/2 FSGS. Patient gets HD on Tuesday, Thursday,  and Saturday.  Anemia Anemia most likely due to CKD.  -- EPO outpatient  COPD Unclear history of COPD. Patient was treated for COPD exacerbation on 05/19/2016 on outpatient.  -- Spiriva -- Albuterol nebulizer q6 hrs prn  DM A1C of 5.0 (06/22/2016) -- Sliding scale insulin -- Hemoglobin A1c   Fluids: None Diet: Heart Healthy DVT Prophylaxis: SQH Code Status: FULL  Dispo: Anticipated discharge today pending TEE results.   Caryn Bee, Medical  Student 06/26/2016, 10:46 AM Pager: [phone]

## 2016-06-26 NOTE — Progress Notes (Signed)
Subjective:  No acute events overnight. Patient is doing well this morning. He was sitting in a chair and talking. He is anxious and ready to go home following his procedure today.  Objective:  Vital signs in last 24 hours: Vitals:   06/26/16 0855 06/26/16 0934 06/26/16 1108 06/26/16 1110  BP: 101/63 104/65 (!) 84/58 101/62  Pulse: 82 80 81 81  Resp: 18 16 16 20   Temp: 97.8 F (36.6 C) 97.8 F (36.6 C) 97.8 F (36.6 C)   TempSrc: Oral Oral Oral   SpO2: 99% 98% 100% 99%  Weight:      Height:       General: obese gentleman, Resting comfortably and in no acute distress Cardiac: RRR, heart sounds distant Pulm: clear to auscultation anteriorly Abd: obese abdomen, normal bowel sounds Ext: right femoral cath site with dry dressing in place Neuro: alert and oriented X3, no facial droop MSK: Bi-lateral lower extremity edema   Assessment/Plan:  Principal Problem:   CVA (cerebral vascular accident) (HCC) Active Problems:   Essential hypertension   Type 2 diabetes mellitus (HCC)   End stage renal disease (HCC)   Acute ischemic stroke (HCC)   Peripheral vascular disease (HCC)   Chronic combined systolic and diastolic congestive heart failure [facility])  Justin Fitzgerald is a 56 year old African-American gentleman with a medical history of end-stage renal disease, hypertension and diabetes who presented with left facial droop upon awakening this morning and a CT scan concerning for acute infarct.   Stroke - Right MCA [place] Infarcts, no remaining deficits Neurological deficits of left facial droop and slurred speech have resolved. We suspect the stroke is secondary to a ruptured plaque from the severely stenosed right M2 as seen on MRA Head, however cardioembolic source needs to be ruled out for which we are investigating with a planned TEE on Monday. We are foregoing anticoagulation until a definite cardioembolic source is identified and will continue with dual antiplatelet  therapy. - Atorvastatin 80 once daily - Permissive hypertension with a goal of 220/110 - Clopidogrel 75 mg + Aspirin 81mg  - TEE Today  ESRD on HD TTS HTN- labile with several episodes of hypotension Access via right IJ tunneled catheter. We are holding his home antihypertensive medications toallow for permissive hypertension with a theoretical benefit of perfusing the penumbra, although his blood pressures have been low this admission. For now, his blood pressures are largely being controlled with the amount of volume taken off of dialysis.  -- Continue HD per Nephrology - HD today -- Blood pressure goal of < 220/110 -- Home meds that are held include Metolazone, furosemide, metoprolol  -- Started Carvedilol 3.125mg  BID for CHF, monitor BP closely   Combined systolic and diastolic congestive heart failure TTE showed a left ventricular ejection fraction in the range of 20-25%. Grade 1 diastolic dysfunction was also noted. This consistent with a combined systolic and diastolic congestive heart failure picture. Cardiac cath on 06/23/16 did show heavily calcified coronaries with occlusion of OM without feasible PCI. His cardiomyopathy is thought to be unrelated to ischemia. CHF medications are being slowly introduced with Carvedilol 3.125 mg BID per cards as his BP allows. ICD may be beneficial in a patient with NICM and an EF < 35% ( for ischemic cardiomyopathy ICD would be indicated with an EF < 30%). - Carvedilol 3.125 mg BID; titrate as BP allows - Appreciate cardiology recommendations - ICD- consider if EF does not improve after maximal medical management with outpatient follow-up.  DM -  SSI-S - Hemoglobin A1c 5.0 (06/22/2016)  ? COPD Patient with tiotropiumlisted as a medication. Prior hospitalization for potential COPD exacerbation. No PFTs on file -- Spiriva -- Albuterol nebulizer q6 hrs prn  DVT/PE prophylaxis: heparin FEN/GI:HH/Carb Code: Full code  Dispo: Anticipated  discharge following TEE today if test is normal.   Thomasene Lot, MD 06/26/2016, 11:57 AM Pager: [phone]

## 2016-06-26 NOTE — Op Note (Signed)
Full report to follow in CV section of chart 

## 2016-06-26 NOTE — H&P (View-Only) (Signed)
Patient Name: Justin Fitzgerald Date of Encounter: 06/25/2016  Primary Cardiologist: [facility]- [facility] Problem List     Principal Problem:   CVA (cerebral vascular accident) [facility]) Active Problems:   Essential hypertension   Type 2 diabetes mellitus (HCC)   End stage renal disease (HCC)   Acute ischemic stroke (HCC)   Peripheral vascular disease (HCC)   Chronic combined systolic and diastolic congestive heart failure (HCC)     Subjective   56 yo with recent dx of chronic combined systolic and diastolic CHF Cath revealed a chronic total occlusion of OM.  This OM occlusion would not explain his global reduction in EF - likely has a cardiomyopathy due to HTN with a component of CAD   Scheduled for TEE tomorrow for CVA work up     Inpatient Medications    Scheduled Meds: . aspirin EC  81 mg Oral Daily  . atorvastatin  80 mg Oral q1800  . budesonide (PULMICORT) nebulizer solution  0.25 mg Nebulization BID  . carvedilol  3.125 mg Oral BID WC  . cinacalcet  120 mg Oral Q supper  . clopidogrel  75 mg Oral Daily  . gabapentin  300 mg Oral QHS  . heparin subcutaneous  5,000 Units Subcutaneous Q8H  . insulin aspart  0-5 Units Subcutaneous QHS  . insulin aspart  0-9 Units Subcutaneous TID WC  . sevelamer carbonate  2.4 g Oral TID WC  . sodium chloride flush  3 mL Intravenous Q12H  . tiotropium  18 mcg Inhalation Daily   Continuous Infusions:  PRN Meds: sodium chloride, acetaminophen, albuterol, heparin, ondansetron (ZOFRAN) IV, sodium chloride flush   Vital Signs    Vitals:   06/24/16 1718 06/24/16 2151 06/25/16 0450 06/25/16 0844  BP: 107/67 (!) 99/58 113/74 92/62  Pulse: 91 89 90 88  Resp:  18 18 20   Temp: 98.1 F (36.7 C) 98.3 F (36.8 C) 97.9 F (36.6 C) 98.6 F (37 C)  TempSrc: Oral Oral Oral Oral  SpO2: 98% 96% 96% 96%  Weight:  (!) 306 lb 6.4 oz (139 kg)    Height:  6' (1.829 m)      Intake/Output Summary (Last 24 hours) at 06/25/16  1124 Last data filed at 06/25/16 0700  Gross per 24 hour  Intake              960 ml  Output             2700 ml  Net            -1740 ml   Filed Weights   06/24/16 0856 06/24/16 1302 06/24/16 2151  Weight: (!) 318 lb 9 oz (144.5 kg) (!) 313 lb 0.9 oz (142 kg) (!) 306 lb 6.4 oz (139 kg)    Physical Exam   GEN: Well nourished, well developed, in no acute distress. Morbidly obese, appears older than stated age.  HEENT: Grossly normal.  Neck: Supple, no JVD, carotid bruits, or masses. Cardiac: RRR, soft systolic murmur , rubs, or gallops. No clubbing, cyanosis, edema.  Radials/DP/PT 2+ and equal bilaterally.  Respiratory:  Respirations regular and unlabored, clear to auscultation bilaterally. GI: Soft, nontender, nondistended, BS + x 4. MS: no deformity or atrophy. Skin: warm and dry, no rash. Neuro:  Strength and sensation are intact. Psych: AAOx3.  Normal affect.  Labs    CBC  Recent Labs  06/24/16 0357 06/25/16 0223  WBC 6.1 7.8  HGB 9.2* 9.1*  HCT 30.0* 29.6*  MCV 91.2 91.4  PLT 194 170   Basic Metabolic Panel  Recent Labs  06/23/16 0722 06/24/16 0932  NA 137 134*  K 3.9 3.8  CL 99* 97*  CO2 26 26  GLUCOSE 90 115*  BUN 19 25*  CREATININE 5.91* 7.43*  CALCIUM 9.1 8.4*  PHOS 5.1* 5.8*   Liver Function Tests  Recent Labs  06/23/16 0722 06/24/16 0932  ALBUMIN 3.1* 2.7*   No results for input(s): LIPASE, AMYLASE in the last 72 hours. Cardiac Enzymes No results for input(s): CKTOTAL, CKMB, CKMBINDEX, TROPONINI in the last 72 hours. BNP Invalid input(s): POCBNP D-Dimer No results for input(s): DDIMER in the last 72 hours. Hemoglobin A1C No results for input(s): HGBA1C in the last 72 hours. Fasting Lipid Panel No results for input(s): CHOL, HDL, LDLCALC, TRIG, CHOLHDL, LDLDIRECT in the last 72 hours. Thyroid Function Tests  Recent Labs  06/23/16 0722  TSH 2.302    Telemetry    Sinus to sinus tach - Personally Reviewed  ECG    No new ECG  - Personally Reviewed  Radiology    No results found.  Cardiac Studies   Echo 06/22/16 Study Conclusions - Left ventricle: The cavity size was moderately dilated. Wall thickness was increased in a pattern of moderate LVH. Systolic function was severely reduced. The estimated ejection fraction was in the range of 20% to 25%. Diffuse hypokinesis. Regional wall motion abnormalities cannot be excluded. Doppler parameters are consistent with abnormal left ventricular relaxation (grade 1 diastolic dysfunction). - Aortic valve: Valve mobility was mildly restricted. Transvalvular velocity was increased. There was mild stenosis. There was mild regurgitation. Valve area (VTI): 1.19 cm^2. Valve area (Vmax): 1.21 cm^2. Valve area (Vmean): 1.09 cm^2. - Aortic root: The aortic root was mildly dilated. - Mitral valve: Mildly to moderately calcified annulus. - Left atrium: The atrium was moderately dilated.   06/23/16 Procedures  Right/Left Heart Cath and Coronary Angiography  Conclusion  1. Heavily calcified coronary arteries with chronic occlusion of the first OM branch of the circumflex, moderate diffuse RCA stenosis, and mild nonobstructive LAD stenosis 2. Normal right heart hemodynamics and normal LVEDP 3. Known severe LV systolic dysfunction  Recommend: Medical therapy for CAD and cardiomyopathy. 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     Patient Profile     56 y.o. male with a PMHx of ESRD on HD, HTN, PVD, DM, morbid obesity and tobacco abuse who was admitted to [facility] on 2/7/2018for evaluation of CVA. During work up he was found to have severe LV dysfunction and cardiology consulted.   Assessment & Plan    Newly diagnosed LV dysfunction: he under went [facility] on Feb 9 which showed heavily calcified coronary arteries with chronic occlusion of the first OM branch of the circumflex, moderate diffuse RCA stenosis, and mild  nonobstructive LAD stenosis. No good targets for PCI and plan to treat medically. It was suspected that his cardiomyopathy was primarily nonischemic (likely hypertensive cardiomyopathy). Filling pressures on cath were normal. Plan for volume management per nephrology (HD). -- BP has been too soft to initiate CHF meds.   He is on low dose coreg.   HTN: BP has been soft.   Seems to be tolerating low dose coreg.  Consider adding ARB once his BP is higher  CAD: as above, L/RHC yesterday which showed heavily calcified coronary arteries with chronic occlusion of the first OM branch of the circumflex, moderate diffuse RCA stenosis, and mild nonobstructive LAD  stenosis. No good targets for PCI and plan to treat medically. Continue ASA, statin. Trying to see if he will tolerate a small dose of Coreg.   Tobacco abuse: counseled on cessation.   ESRD on HD:  Continue HD.   Further plans pernephrology   Morbid obesity: Body mass index is 41.56 kg/m. Diet and exercise recommended   DMT2: under excellent control. Hg A1c 5.0     Kristeen Miss, MD  06/25/2016 11:33 AM    [facility] [address] [place],  [address] [place], [place]  [address] Pager [phone] Phone: [phone]; Fax: [phone]

## 2016-06-26 NOTE — Anesthesia Preprocedure Evaluation (Signed)
Anesthesia Evaluation  Patient identified by MRN, date of birth, ID band Patient awake    Reviewed: Allergy & Precautions, NPO status , Patient's Chart, lab work & pertinent test results, reviewed documented beta blocker date and time   Airway Mallampati: III  TM Distance: >3 FB Neck ROM: Full    Dental no notable dental hx. (+) Teeth Intact   Pulmonary Current Smoker,    Pulmonary exam normal breath sounds clear to auscultation       Cardiovascular hypertension, Pt. on medications and Pt. on home beta blockers + Peripheral Vascular Disease and +CHF  Normal cardiovascular exam Rhythm:Regular Rate:Normal     Neuro/Psych CVA, No Residual Symptoms negative psych ROS   GI/Hepatic negative GI ROS, Neg liver ROS,   Endo/Other  diabetes, Well Controlled, Type 2Morbid obesityHyperlipidemia  Renal/GU Dialysis and ESRFRenal diseaseDialysis T, Th, Sa  negative genitourinary   Musculoskeletal   Abdominal (+) + obese,   Peds  Hematology  (+) anemia , On Plavix- last dose yesterday pm   Anesthesia Other Findings   Reproductive/Obstetrics                             Anesthesia Physical Anesthesia Plan  ASA: IV  Anesthesia Plan: MAC   Post-op Pain Management:    Induction: Intravenous  Airway Management Planned: Nasal Cannula and Natural Airway  Additional Equipment:   Intra-op Plan:   Post-operative Plan:   Informed Consent: I have reviewed the patients History and Physical, chart, labs and discussed the procedure including the risks, benefits and alternatives for the proposed anesthesia with the patient or authorized representative who has indicated his/her understanding and acceptance.   Dental advisory given  Plan Discussed with: CRNA, Anesthesiologist and Surgeon  Anesthesia Plan Comments:         Anesthesia Quick Evaluation

## 2016-06-27 ENCOUNTER — Encounter: Payer: Self-pay | Admitting: Emergency Medicine

## 2016-06-27 ENCOUNTER — Emergency Department
Admission: EM | Admit: 2016-06-27 | Discharge: 2016-06-27 | Disposition: A | Discharge status: Home / Self Care | Payer: Medicare Other | Attending: Emergency Medicine | Admitting: Emergency Medicine

## 2016-06-27 DIAGNOSIS — Z7982 Long term (current) use of aspirin: Secondary | ICD-10-CM | POA: Insufficient documentation

## 2016-06-27 DIAGNOSIS — T8242XA Displacement of vascular dialysis catheter, initial encounter: Secondary | ICD-10-CM | POA: Diagnosis present

## 2016-06-27 DIAGNOSIS — I132 Hypertensive heart and chronic kidney disease with heart failure and with stage 5 chronic kidney disease, or end stage renal disease: Secondary | ICD-10-CM | POA: Insufficient documentation

## 2016-06-27 DIAGNOSIS — Z992 Dependence on renal dialysis: Secondary | ICD-10-CM | POA: Diagnosis not present

## 2016-06-27 DIAGNOSIS — Z79899 Other long term (current) drug therapy: Secondary | ICD-10-CM | POA: Insufficient documentation

## 2016-06-27 DIAGNOSIS — Y69 Unspecified misadventure during surgical and medical care: Secondary | ICD-10-CM | POA: Diagnosis not present

## 2016-06-27 DIAGNOSIS — N186 End stage renal disease: Secondary | ICD-10-CM | POA: Insufficient documentation

## 2016-06-27 DIAGNOSIS — E1122 Type 2 diabetes mellitus with diabetic chronic kidney disease: Secondary | ICD-10-CM | POA: Diagnosis not present

## 2016-06-27 DIAGNOSIS — F17210 Nicotine dependence, cigarettes, uncomplicated: Secondary | ICD-10-CM | POA: Diagnosis not present

## 2016-06-27 DIAGNOSIS — I5042 Chronic combined systolic (congestive) and diastolic (congestive) heart failure: Secondary | ICD-10-CM | POA: Diagnosis not present

## 2016-06-27 NOTE — ED Notes (Signed)
Pt states felt something fall down shirt, looked down and dialysis cath was laying on the bed.

## 2016-06-27 NOTE — ED Triage Notes (Signed)
Right chest dialysis catheter came out approximately 1 hour PTA.  Site clean and dry. Lung Sounds equally. No SOB/ DOE.  NAD

## 2016-06-27 NOTE — ED Provider Notes (Signed)
[facility] Emergency Department Provider Note  ____________________________________________  Time seen: Approximately 7:50 PM  I have reviewed the triage vital signs and the nursing notes.   HISTORY  Chief Complaint Vascular Access Problem   HPI Justin Fitzgerald is a 56 y.o. male h/o ESRD on HD (TTS) who presents for evaluation of dislodge HD catheter. Catheter was placeda month ago by Dr. Wyn Quaker. Patient had a full dialysis treatment today and reports that he was laying in bed when the catheter came out. No bleeding, no chest pain or shortness of breath.  Past Medical History:  Diagnosis Date  . ESRD (end stage renal disease) on dialysis (HCC)    "TTS; [facility] [place]" (06/22/2016)  . History of gout   . Hyperlipidemia   . Hypertension   . Peripheral vascular disease (HCC)   . Stroke (HCC) 06/20/2016   denies residual on 06/22/2016  . Type II diabetes mellitus [facility])     Patient Active Problem List   Diagnosis Date Noted  . Chronic combined systolic and diastolic congestive heart failure (HCC)   . CVA (cerebral vascular accident) (HCC) 06/21/2016  . Acute ischemic stroke (HCC) 06/21/2016  . Peripheral vascular disease (HCC) 06/21/2016  . Acute respiratory failure (HCC) 05/11/2016  . Peritonitis (HCC) 05/11/2016  . End stage renal disease (HCC) 03/27/2016  . Complication from renal dialysis device 03/27/2016  . Chronic renal insufficiency, stage 4 (severe) (HCC) 02/28/2016  . Lymphedema 02/28/2016  . Essential hypertension 02/28/2016  . Type 2 diabetes mellitus (HCC) 02/28/2016    Past Surgical History:  Procedure Laterality Date  . EXTERIORIZATION OF A CONTINUOUS AMBULATORY PERITONEAL DIALYSIS CATHETER    . FRACTURE SURGERY    . PERIPHERAL VASCULAR CATHETERIZATION N/A 03/08/2016   Procedure: Dialysis/Perma Catheter Insertion;  Surgeon: Renford Dills, MD;  Location: [facility] INVASIVE CV LAB;  Service: Cardiovascular;  Laterality: N/A;    . PERIPHERAL VASCULAR CATHETERIZATION N/A 04/20/2016   Procedure: Dialysis/Perma Catheter Removal;  Surgeon: Annice Needy, MD;  Location: [facility] INVASIVE CV LAB;  Service: Cardiovascular;  Laterality: N/A;  . PERIPHERAL VASCULAR CATHETERIZATION N/A 05/25/2016   Procedure: Dialysis/Perma Catheter Insertion;  Surgeon: Annice Needy, MD;  Location: [facility] INVASIVE CV LAB;  Service: Cardiovascular;  Laterality: N/A;  . RIGHT/LEFT HEART CATH AND CORONARY ANGIOGRAPHY N/A 06/23/2016   Procedure: Right/Left Heart Cath and Coronary Angiography;  Surgeon: [name], MD;  Location: [facility] INVASIVE CV LAB;  Service: Cardiovascular;  Laterality: N/A;  . WRIST FRACTURE SURGERY Right ~ 2000   "got 8 screws in there"    Prior to Admission medications   Medication Sig Start Date End Date Taking? Authorizing Provider  aspirin EC 81 MG EC tablet Take 1 tablet (81 mg total) by mouth daily. 06/27/16   Thomasene Lot, MD  atorvastatin (LIPITOR) 80 MG tablet Take 1 tablet (80 mg total) by mouth daily at 6 PM. 06/26/16   Thomasene Lot, MD  beclomethasone (QVAR) 40 MCG/ACT inhaler Inhale 1 puff into the lungs 2 (two) times daily. Patient not taking: Reported on 06/21/2016 05/12/16   Alford Highland, MD  carvedilol (COREG) 3.125 MG tablet Take 1 tablet (3.125 mg total) by mouth 2 (two) times daily with a meal. 06/26/16   Thomasene Lot, MD  cinacalcet (SENSIPAR) 30 MG tablet Take 4 tablets (120 mg total) by mouth daily with supper. 05/12/16   Alford Highland, MD  clopidogrel (PLAVIX) 75 MG tablet Take 75 mg by mouth daily. 05/19/16   Historical Provider, MD  furosemide (LASIX) 80 MG tablet Take 160 mg by mouth 2 (two) times daily.    Historical Provider, MD  gabapentin (NEURONTIN) 300 MG capsule Take 300 mg by mouth at bedtime.  02/08/16   Historical Provider, MD  sevelamer carbonate (RENVELA) 2.4 g PACK Take 2.4 g by mouth 3 (three) times daily with meals. 05/12/16   Alford Highland, MD  SPIRIVA HANDIHALER 18 MCG inhalation capsule Place  18 mcg into inhaler and inhale daily.  02/21/16   Historical Provider, MD    Allergies Ramipril and Aspirin  Family History  Problem Relation Age of Onset  . Hypertension Mother   . CAD Mother     Social History Social History  Substance Use Topics  . Smoking status: Current Every Day Smoker    Packs/day: 0.50    Years: 40.00    Types: Cigarettes  . Smokeless tobacco: Former User    Types: Chew     Comment: 06/22/2016 "quit chewing when I was 54 or 56 years old"  . Alcohol use No    Review of Systems  Constitutional: Negative for fever. Eyes: Negative for visual changes. ENT: Negative for sore throat. Neck: No neck pain  Cardiovascular: Negative for chest pain. Respiratory: Negative for shortness of breath. Gastrointestinal: Negative for abdominal pain, vomiting or diarrhea. Genitourinary: Negative for dysuria. Musculoskeletal: Negative for back pain. Skin: Negative for rash. Neurological: Negative for headaches, weakness or numbness. Psych: No SI or HI  ____________________________________________   PHYSICAL EXAM:  VITAL SIGNS: ED Triage Vitals  Enc Vitals Group     BP 06/27/16 1833 (!) 112/52     Pulse Rate 06/27/16 1833 98     Resp 06/27/16 1833 20     Temp 06/27/16 1833 98 F (36.7 C)     Temp Source 06/27/16 1833 Oral     SpO2 06/27/16 1833 99 %     Weight --      Height --      Head Circumference --      Peak Flow --      Pain Score 06/27/16 1900 0     Pain Loc --      Pain Edu? --      Excl. in GC? --     Constitutional: Alert and oriented. Well appearing and in no apparent distress. HEENT:      Head: Normocephalic and atraumatic.         Eyes: Conjunctivae are normal. Sclera is non-icteric. EOMI. PERRL      Mouth/Throat: Mucous membranes are moist.       Neck: Supple with no signs of meningismus. Cardiovascular: Site of HD catheter with no bleeding, no swelling. Regular rate and rhythm. No murmurs, gallops, or rubs. 2+ symmetrical distal  pulses are present in all extremities. No JVD. Respiratory: Normal respiratory effort. Lungs are clear to auscultation bilaterally. No wheezes, crackles, or rhonchi.  Gastrointestinal: Soft, non tender, and non distended with positive bowel sounds. No rebound or guarding. Musculoskeletal: Nontender with normal range of motion in all extremities. No edema, cyanosis, or erythema of extremities. Neurologic: Normal speech and language. Face is symmetric. Moving all extremities. No gross focal neurologic deficits are appreciated. Skin: Skin is warm, dry and intact. No rash noted. Psychiatric: Mood and affect are normal. Speech and behavior are normal.  ____________________________________________   LABS (all labs ordered are listed, but only abnormal results are displayed)  Labs Reviewed - No data to display ____________________________________________  EKG  none  ____________________________________________  RADIOLOGY  none  ____________________________________________   PROCEDURES  Procedure(s) performed: None Procedures Critical Care performed:  None ____________________________________________   INITIAL IMPRESSION / ASSESSMENT AND PLAN / ED COURSE  56 y.o. male h/o ESRD on HD (TTS) who presents for evaluation of dislodge HD catheter. Spoke with Dr. Gilda Crease, vasculare who recommended discharge patient home, nothing by mouth at midnight, and call his office first thing in the morning to arrange replacement of the catheter. Recommended the patient returns to the emergency room if he noticed bleeding from the catheter ostomy site or bulging. Otherwise he will follow-up at vascular surgery office tomorrow      Pertinent labs & imaging results that were available during my care of the patient were reviewed by me and considered in my medical decision making (see chart for details).    ____________________________________________   FINAL CLINICAL IMPRESSION(S) / ED  DIAGNOSES  Final diagnoses:  Displacement of vascular dialysis catheter, initial encounter (HCC)      NEW MEDICATIONS STARTED DURING THIS VISIT:  New Prescriptions   No medications on file     Note:  This document was prepared using Dragon voice recognition software and may include unintentional dictation errors.    Nita Sickle, MD 06/27/16 [phone]

## 2016-06-27 NOTE — ED Triage Notes (Signed)
Pt states his dialysis catheter fell out today after having his treatment today.

## 2016-06-27 NOTE — ED Notes (Signed)
Per Edp dress catheter site. Sterile 4x4 applied with tegaderm over top to right upper chest. Wound was clean and dry. Instructed patient that if he showered tonight to be gentle washing over area not to scrub even with dressing intact.

## 2016-06-27 NOTE — Discharge Instructions (Signed)
Call Dr. Driscilla Grammes office first thing in the morning to have your catheter replaced. Do not eat or drink after midnight. Return to the ER if you noticed swelling or bleeding at the site.

## 2016-06-28 ENCOUNTER — Encounter (HOSPITAL_COMMUNITY): Payer: Self-pay | Admitting: Internal Medicine

## 2016-06-28 ENCOUNTER — Encounter
Admission: RE | Disposition: A | Discharge status: Home / Self Care | Payer: Self-pay | Source: Ambulatory Visit | Attending: Vascular Surgery

## 2016-06-28 ENCOUNTER — Ambulatory Visit
Admission: RE | Admit: 2016-06-28 | Discharge: 2016-06-28 | Disposition: A | Discharge status: Home / Self Care | Payer: Medicare Other | Source: Ambulatory Visit | Attending: Vascular Surgery | Admitting: Vascular Surgery

## 2016-06-28 ENCOUNTER — Telehealth (INDEPENDENT_AMBULATORY_CARE_PROVIDER_SITE_OTHER): Payer: Self-pay | Admitting: Vascular Surgery

## 2016-06-28 DIAGNOSIS — I10 Essential (primary) hypertension: Secondary | ICD-10-CM

## 2016-06-28 DIAGNOSIS — Z955 Presence of coronary angioplasty implant and graft: Secondary | ICD-10-CM | POA: Diagnosis not present

## 2016-06-28 DIAGNOSIS — T82898A Other specified complication of vascular prosthetic devices, implants and grafts, initial encounter: Secondary | ICD-10-CM | POA: Diagnosis present

## 2016-06-28 DIAGNOSIS — Z886 Allergy status to analgesic agent status: Secondary | ICD-10-CM | POA: Diagnosis not present

## 2016-06-28 DIAGNOSIS — Z8249 Family history of ischemic heart disease and other diseases of the circulatory system: Secondary | ICD-10-CM | POA: Diagnosis not present

## 2016-06-28 DIAGNOSIS — T82868A Thrombosis of vascular prosthetic devices, implants and grafts, initial encounter: Secondary | ICD-10-CM

## 2016-06-28 DIAGNOSIS — F17210 Nicotine dependence, cigarettes, uncomplicated: Secondary | ICD-10-CM | POA: Diagnosis not present

## 2016-06-28 DIAGNOSIS — Z992 Dependence on renal dialysis: Secondary | ICD-10-CM | POA: Insufficient documentation

## 2016-06-28 DIAGNOSIS — E1122 Type 2 diabetes mellitus with diabetic chronic kidney disease: Secondary | ICD-10-CM | POA: Insufficient documentation

## 2016-06-28 DIAGNOSIS — E119 Type 2 diabetes mellitus without complications: Secondary | ICD-10-CM

## 2016-06-28 DIAGNOSIS — E785 Hyperlipidemia, unspecified: Secondary | ICD-10-CM | POA: Insufficient documentation

## 2016-06-28 DIAGNOSIS — Z8673 Personal history of transient ischemic attack (TIA), and cerebral infarction without residual deficits: Secondary | ICD-10-CM | POA: Diagnosis not present

## 2016-06-28 DIAGNOSIS — E1151 Type 2 diabetes mellitus with diabetic peripheral angiopathy without gangrene: Secondary | ICD-10-CM | POA: Insufficient documentation

## 2016-06-28 DIAGNOSIS — I120 Hypertensive chronic kidney disease with stage 5 chronic kidney disease or end stage renal disease: Secondary | ICD-10-CM | POA: Diagnosis not present

## 2016-06-28 DIAGNOSIS — M109 Gout, unspecified: Secondary | ICD-10-CM | POA: Diagnosis not present

## 2016-06-28 DIAGNOSIS — Z888 Allergy status to other drugs, medicaments and biological substances status: Secondary | ICD-10-CM | POA: Insufficient documentation

## 2016-06-28 DIAGNOSIS — Y838 Other surgical procedures as the cause of abnormal reaction of the patient, or of later complication, without mention of misadventure at the time of the procedure: Secondary | ICD-10-CM | POA: Diagnosis not present

## 2016-06-28 DIAGNOSIS — N186 End stage renal disease: Secondary | ICD-10-CM | POA: Insufficient documentation

## 2016-06-28 HISTORY — PX: DIALYSIS/PERMA CATHETER INSERTION: CATH118288

## 2016-06-28 LAB — GLUCOSE, CAPILLARY
GLUCOSE-CAPILLARY: 73 mg/dL (ref 65–99)
Glucose-Capillary: 83 mg/dL (ref 65–99)

## 2016-06-28 SURGERY — DIALYSIS/PERMA CATHETER INSERTION
Anesthesia: Moderate Sedation

## 2016-06-28 MED ORDER — HEPARIN SODIUM (PORCINE) 10000 UNIT/ML IJ SOLN
INTRAMUSCULAR | Status: AC
  Filled 2016-06-28: qty 1

## 2016-06-28 MED ORDER — FENTANYL CITRATE (PF) 100 MCG/2ML IJ SOLN
INTRAMUSCULAR | Status: DC | PRN
  Administered 2016-06-28: 100 ug via INTRAVENOUS
  Administered 2016-06-28 (×2): 50 ug via INTRAVENOUS

## 2016-06-28 MED ORDER — MIDAZOLAM HCL 5 MG/5ML IJ SOLN
INTRAMUSCULAR | Status: AC
  Filled 2016-06-28: qty 5

## 2016-06-28 MED ORDER — MIDAZOLAM HCL 2 MG/2ML IJ SOLN
INTRAMUSCULAR | Status: DC | PRN
  Administered 2016-06-28: 1 mg via INTRAVENOUS
  Administered 2016-06-28: 2 mg via INTRAVENOUS
  Administered 2016-06-28: 1 mg via INTRAVENOUS

## 2016-06-28 MED ORDER — HEPARIN SODIUM (PORCINE) 1000 UNIT/ML IJ SOLN
INTRAMUSCULAR | Status: AC
  Filled 2016-06-28: qty 1

## 2016-06-28 MED ORDER — CEFAZOLIN SODIUM-DEXTROSE 2-3 GM-% IV SOLR
2.0000 g | INTRAVENOUS | Status: DC
  Filled 2016-06-28: qty 50

## 2016-06-28 MED ORDER — FENTANYL CITRATE (PF) 100 MCG/2ML IJ SOLN
INTRAMUSCULAR | Status: AC
  Filled 2016-06-28: qty 2

## 2016-06-28 MED ORDER — LIDOCAINE-EPINEPHRINE (PF) 2 %-1:200000 IJ SOLN
INTRAMUSCULAR | Status: AC
  Filled 2016-06-28: qty 20

## 2016-06-28 MED ORDER — CEFAZOLIN IN D5W 1 GM/50ML IV SOLN
1.0000 g | Freq: Once | INTRAVENOUS | Status: AC
  Administered 2016-06-28: 1 g via INTRAVENOUS

## 2016-06-28 MED ORDER — CEFAZOLIN SODIUM-DEXTROSE 2-4 GM/100ML-% IV SOLN
2.0000 g | INTRAVENOUS | Status: DC
  Filled 2016-06-28: qty 100

## 2016-06-28 MED ORDER — HEPARIN (PORCINE) IN NACL 2-0.9 UNIT/ML-% IJ SOLN
INTRAMUSCULAR | Status: AC
  Filled 2016-06-28: qty 500

## 2016-06-28 SURGICAL SUPPLY — 2 items
CATH PALINDROME RT-P 15FX23CM (CATHETERS) ×3 IMPLANT
PACK ANGIOGRAPHY (CUSTOM PROCEDURE TRAY) ×3 IMPLANT

## 2016-06-28 NOTE — Telephone Encounter (Signed)
Patient went to ER last night because cath came out, was told to call this morning to get it scheduled to put back in. Call [place], [phone]

## 2016-06-28 NOTE — Op Note (Signed)
[facility]   OPERATIVE NOTE     PROCEDURE: 1. Insertion of a left IJ tunneled dialysis catheter. 2. Catheter placement and cannulation under ultrasound and fluoroscopic guidance  PRE-OPERATIVE DIAGNOSIS: end-stage renal requiring hemodialysis  POST-OPERATIVE DIAGNOSIS: same as above  SURGEON: Levora Dredge  ANESTHESIA: Conscious sedation was administered under my direct supervision. IV Versed plus fentanyl were utilized. Continuous ECG, pulse oximetry and blood pressure was monitored throughout the entire procedure.  Conscious sedation was for a total of 25 minutes.  ESTIMATED BLOOD LOSS: Minimal  FINDING(S): 1.  Tips of the catheter in the right atrium on fluoroscopy 2.  No obvious pneumothorax on fluoroscopy  SPECIMEN(S):  none  INDICATIONS:   Justin Fitzgerald is a 56 y.o. male  presents with end stage renal disease.  Therefore, the patient requires a tunneled dialysis catheter placement.  The patient is informed of  the risks catheter placement include but are not limited to: bleeding, infection, central venous injury, pneumothorax, possible venous stenosis, possible malpositioning in the venous system, and possible infections related to long-term catheter presence.  The patient was aware of these risks and agreed to proceed.  DESCRIPTION: The patient was taken back to Special Procedure suite.  Prior to sedation, the patient was given IV antibiotics.  After obtaining adequate sedation, the patient was prepped and draped in the standard fashion for a chest or neck tunneled dialysis catheter placement.  Appropriate Time Out is called.   The the left neck and chest wall are then infiltrated with 1% Lidocaine with epinepherine.  A 23 cm tip to cuff palindrome catheter is then selected, opened on the back table and prepped. Under ultrasound guidance, the left internal jugular vein was cannulated with the Seldinger needle.  A J-wire was then advanced under  fluoroscopic guidance into the inferior vena cava and the wire was secured.  Small counter incision was then made at the wire insertion site. A small pocket was fashioned with blunt dissection to allow easier passage of the cuff.  The dilator and peel-away sheath are then advanced over the wire under fluoroscopic guidance. The catheters and advanced through the peel-away sheath after removal of the wire. It is approximated to the chest wall after verifying the tips at the atrial caval junction and an exit site is selected.  Small incision is made at the selected exit site and the tunneling device was passed subcutaneously to the neck counter incision. Catheter is then pulled through the subcutaneous tunnel. The catheter is then verified for tip position under fluoroscopy, transected and the hub assembly connected.    Each port was tested by aspirating and flushing.  No resistance was noted.  Each port was then thoroughly flushed with heparinized saline.  The catheter was secured in placed with two interrupted stitches of 0 silk tied to the catheter.  The neck incision was closed with a U-stitch of 4-0 Monocryl.  The neck and chest incision were cleaned and sterile bandages applied including a Biopatch.  Each port was then packed with concentrated heparin (1000 Units/mL) at the manufacturer recommended volumes to each port.  Sterile caps were applied to each port.  On completion fluoroscopy, the tips of the catheter were in the right atrium, and there was no evidence of pneumothorax.  COMPLICATIONS: None  CONDITION: Unchanged   Levora Dredge [place] [facility] Office: [phone]   06/28/2016, 4:01 PM

## 2016-06-28 NOTE — H&P (Signed)
[facility] Admission History & Physical  MRN : [id number]  Justin Fitzgerald is a 56 y.o. (December 04, 1960) male who presents with chief complaint of No chief complaint on file. Justin Fitzgerald  History of Present Illness: I am asked to evaluate the patient by the dialysis center. The patient was sent here because they were unable to achieve adequate dialysis this morning. According to the patient his catheter"fell out".  Patient denies pain or tenderness overlying the access.  There was no pain with dialysis.      Current Facility-Administered Medications  Medication Dose Route Frequency Provider Last Rate Last Dose  . [START ON 06/29/2016] ceFAZolin (ANCEF) IVPB 2g/100 mL premix  2 g Intravenous On Call to OR Renford Dills, MD        Past Medical History:  Diagnosis Date  . ESRD (end stage renal disease) on dialysis (HCC)    "TTS; [facility] [place]" (06/22/2016)  . History of gout   . Hyperlipidemia   . Hypertension   . Peripheral vascular disease (HCC)   . Stroke (HCC) 06/20/2016   denies residual on 06/22/2016  . Type II diabetes mellitus (HCC)     Past Surgical History:  Procedure Laterality Date  . EXTERIORIZATION OF A CONTINUOUS AMBULATORY PERITONEAL DIALYSIS CATHETER    . FRACTURE SURGERY    . PERIPHERAL VASCULAR CATHETERIZATION N/A 03/08/2016   Procedure: Dialysis/Perma Catheter Insertion;  Surgeon: Renford Dills, MD;  Location: [facility] INVASIVE CV LAB;  Service: Cardiovascular;  Laterality: N/A;  . PERIPHERAL VASCULAR CATHETERIZATION N/A 04/20/2016   Procedure: Dialysis/Perma Catheter Removal;  Surgeon: Annice Needy, MD;  Location: [facility] INVASIVE CV LAB;  Service: Cardiovascular;  Laterality: N/A;  . PERIPHERAL VASCULAR CATHETERIZATION N/A 05/25/2016   Procedure: Dialysis/Perma Catheter Insertion;  Surgeon: Annice Needy, MD;  Location: [facility] INVASIVE CV LAB;  Service: Cardiovascular;  Laterality: N/A;  . RIGHT/LEFT HEART CATH AND CORONARY ANGIOGRAPHY N/A  06/23/2016   Procedure: Right/Left Heart Cath and Coronary Angiography;  Surgeon: [name], MD;  Location: [facility] INVASIVE CV LAB;  Service: Cardiovascular;  Laterality: N/A;  . WRIST FRACTURE SURGERY Right ~ 2000   "got 8 screws in there"    Social History Social History  Substance Use Topics  . Smoking status: Current Every Day Smoker    Packs/day: 0.50    Years: 40.00    Types: Cigarettes  . Smokeless tobacco: Former User    Types: Chew     Comment: 06/22/2016 "quit chewing when I was 22 or 56 years old"  . Alcohol use No    Family History Family History  Problem Relation Age of Onset  . Hypertension Mother   . CAD Mother     No family history of bleeding or clotting disorders, autoimmune disease or porphyria  Allergies  Allergen Reactions  . Ramipril Shortness Of Breath  . Aspirin     kidney disease     REVIEW OF SYSTEMS (Negative unless checked)  Constitutional: [] Weight loss  [] Fever  [] Chills Cardiac: [] Chest pain   [] Chest pressure   [] Palpitations   [] Shortness of breath when laying flat   [] Shortness of breath at rest   [x] Shortness of breath with exertion. Vascular:  [] Pain in legs with walking   [] Pain in legs at rest   [] Pain in legs when laying flat   [] Claudication   [] Pain in feet when walking  [] Pain in feet at rest  [] Pain in feet when laying flat   [] History of DVT   []   Phlebitis   [] Swelling in legs   [] Varicose veins   [] Non-healing ulcers Pulmonary:   [] Uses home oxygen   [] Productive cough   [] Hemoptysis   [] Wheeze  [] COPD   [] Asthma Neurologic:  [] Dizziness  [] Blackouts   [] Seizures   [] History of stroke   [] History of TIA  [] Aphasia   [] Temporary blindness   [] Dysphagia   [] Weakness or numbness in arms   [] Weakness or numbness in legs Musculoskeletal:  [] Arthritis   [] Joint swelling   [] Joint pain   [] Low back pain Hematologic:  [] Easy bruising  [] Easy bleeding   [] Hypercoagulable state   [] Anemic  [] Hepatitis Gastrointestinal:  [] Blood in stool    [] Vomiting blood  [] Gastroesophageal reflux/heartburn   [] Difficulty swallowing. Genitourinary:  [x] Chronic kidney disease   [] Difficult urination  [] Frequent urination  [] Burning with urination   [] Blood in urine Skin:  [] Rashes   [] Ulcers   [] Wounds Psychological:  [] History of anxiety   []  History of major depression.  Physical Examination  Vitals:   06/28/16 1228  BP: 107/76  Pulse: 85  Resp: 20  Temp: 98 F (36.7 C)  TempSrc: Oral  SpO2: 97%  Weight: (!) 138.8 kg (306 lb)  Height: 6' (1.829 m)   Body mass index is 41.5 kg/m. Gen: WD/WN, NAD Head: [place]/AT, No temporalis wasting. Prominent temp pulse not noted. Ear/Nose/Throat: Hearing grossly intact, nares w/o erythema or drainage, oropharynx w/o Erythema/Exudate,  Eyes: Conjunctiva clear, sclera non-icteric Neck: Trachea midline.  No JVD.  Pulmonary:  Good air movement, respirations not labored, no use of accessory muscles.  Cardiac: RRR, normal S1, S2. Vascular: catheter exit site clean and intact no drainage, nontender Vessel Right Left  Radial Palpable Palpable  Ulnar Not Palpable Not Palpable  Brachial Palpable Palpable  Carotid Palpable, without bruit Palpable, without bruit  Gastrointestinal: soft, non-tender/non-distended. No guarding/reflex.  Musculoskeletal: M/S 5/5 throughout.  Extremities without ischemic changes.  No deformity or atrophy.  Neurologic: Sensation grossly intact in extremities.  Symmetrical.  Speech is fluent. Motor exam as listed above. Psychiatric: Judgment intact, Mood & affect appropriate for pt's clinical situation. Dermatologic: No rashes or ulcers noted.  No cellulitis or open wounds. Lymph : No Cervical, Axillary, or Inguinal lymphadenopathy.   CBC Lab Results  Component Value Date   WBC 7.6 06/26/2016   HGB 8.8 (L) 06/26/2016   HCT 27.9 (L) 06/26/2016   MCV 90.9 06/26/2016   PLT 193 06/26/2016    BMET    Component Value Date/Time   NA 134 (L) 06/24/2016 0932   K 3.8  06/24/2016 0932   CL 97 (L) 06/24/2016 0932   CO2 26 06/24/2016 0932   GLUCOSE 115 (H) 06/24/2016 0932   BUN 25 (H) 06/24/2016 0932   CREATININE 7.43 (H) 06/24/2016 0932   CALCIUM 8.4 (L) 06/24/2016 0932   GFRNONAA 7 (L) 06/24/2016 0932   GFRAA 8 (L) 06/24/2016 0932   Estimated Creatinine Clearance: 15.8 mL/min (by C-G formula based on SCr of 7.43 mg/dL (H)).  COAG Lab Results  Component Value Date   INR 0.97 06/21/2016    Radiology Mr Maxine Glenn Head Wo Contrast  Result Date: 06/21/2016 CLINICAL DATA:  LEFT facial droop and slurred speech beginning this morning. LEFT hand weakness. Follow-up code stroke. History of end-stage on dialysis, hypertension, hyperlipidemia and diabetes. EXAM: MR HEAD WITHOUT CONTRAST MR CIRCLE OF WILLIS WITHOUT CONTRAST MRA OF THE NECK WITHOUT CONTRAST TECHNIQUE: Multiplanar, multiecho pulse sequences of the brain, circle of Willis and surrounding structures were obtained without intravenous contrast.  Angiographic images of the neck were obtained using MRA technique without intravenous contrast. COMPARISON:  CT HEAD June 21, 2016 at 1141 hours FINDINGS: MR HEAD FINDINGS- multiple sequences are mildly motion degraded. BRAIN: Subcentimeter focus of reduced diffusion RIGHT frontal and parietal lobes with low ADC values. Patchy reduced diffusion RIGHT basal ganglia and insula with normalizing ADC values in intermediate to bright FLAIR T2 hyperintense signal. No susceptibility artifact to suggest hemorrhage. The ventricles and sulci are normal for patient's age. Greater than expected scattered subcentimeter supratentorial white matter FLAIR T2 hyperintensities, exclusive of the aforementioned abnormality. No mass effect. No abnormal extra-axial fluid collections. VASCULAR: Normal major intracranial vascular flow voids present at skull base. SKULL AND UPPER CERVICAL SPINE: No abnormal sellar expansion. No suspicious calvarial bone marrow signal. Craniocervical junction  maintained. SINUSES/ORBITS: Mild paranasal sinus mucosal thickening. Mastoid air cells are well aerated. The included ocular globes and orbital contents are non-suspicious. OTHER: None. MR CIRCLE OF WILLIS FINDINGS- moderately motion degraded examination . ANTERIOR CIRCULATION: Normal flow related enhancement of the included cervical, petrous, cavernous and supraclinoid internal carotid arteries. Patent anterior communicating artery. Flow related enhancement of the anterior and middle cerebral arteries, including distal segments. Moderate stenosis distal RIGHT M1 and severe RIGHT M2 origins. Moderate stenosis proximal LEFT M2 origin, superior division. No large vessel occlusion, abnormal luminal irregularity, aneurysm. POSTERIOR CIRCULATION: LEFT vertebral artery is dominant. Basilar artery is patent, with normal flow related enhancement of the main branch vessels. Mild luminal irregularity bilateral vertebral arteries compatible with atherosclerosis. Normal flow related enhancement of the posterior cerebral arteries. No large vessel occlusion, high-grade stenosis, abnormal luminal irregularity, aneurysm. ANATOMIC VARIANTS: None. MRA NECK FINDINGS- limited noncontrast, moderately motion degraded examination. ANTERIOR CIRCULATION: The common carotid arteries are widely patent bilaterally origins not imaged due to time-of-flight technique. The carotid bifurcation is patent bilaterally and there is no hemodynamically significant carotid stenosis by NASCET criteria. No evidence for atherosclerosis or flow limiting stenosis of the cervical internal carotid arteries. POSTERIOR CIRCULATION: Bilateral vertebral arteries are patent to the vertebrobasilar junction, origins not imaged. No evidence for atherosclerosis or flow limiting stenosis. Source images and MIP image were reviewed. IMPRESSION: MRI HEAD: Mildly motion degraded examination. Multifocal acute on subacute RIGHT MCA territory infarcts including RIGHT basal  ganglia. Mild to moderate white matter changes compatible chronic small vessel ischemic disease. MRA HEAD: Moderately motion degraded examination. No emergent large vessel occlusion. Severe stenosis RIGHT M2 origin. Moderate stenosis RIGHT M1 and LEFT M2 origin. MRA NECK: Negative noncontrast moderately motion degraded examination. Electronically Signed   By: Awilda Metro M.D.   On: 06/21/2016 21:18   Mr Maxine Glenn Neck Wo Contrast  Result Date: 06/21/2016 CLINICAL DATA:  LEFT facial droop and slurred speech beginning this morning. LEFT hand weakness. Follow-up code stroke. History of end-stage on dialysis, hypertension, hyperlipidemia and diabetes. EXAM: MR HEAD WITHOUT CONTRAST MR CIRCLE OF WILLIS WITHOUT CONTRAST MRA OF THE NECK WITHOUT CONTRAST TECHNIQUE: Multiplanar, multiecho pulse sequences of the brain, circle of Willis and surrounding structures were obtained without intravenous contrast. Angiographic images of the neck were obtained using MRA technique without intravenous contrast. COMPARISON:  CT HEAD June 21, 2016 at 1141 hours FINDINGS: MR HEAD FINDINGS- multiple sequences are mildly motion degraded. BRAIN: Subcentimeter focus of reduced diffusion RIGHT frontal and parietal lobes with low ADC values. Patchy reduced diffusion RIGHT basal ganglia and insula with normalizing ADC values in intermediate to bright FLAIR T2 hyperintense signal. No susceptibility artifact to suggest hemorrhage. The ventricles and sulci are normal  for patient's age. Greater than expected scattered subcentimeter supratentorial white matter FLAIR T2 hyperintensities, exclusive of the aforementioned abnormality. No mass effect. No abnormal extra-axial fluid collections. VASCULAR: Normal major intracranial vascular flow voids present at skull base. SKULL AND UPPER CERVICAL SPINE: No abnormal sellar expansion. No suspicious calvarial bone marrow signal. Craniocervical junction maintained. SINUSES/ORBITS: Mild paranasal sinus  mucosal thickening. Mastoid air cells are well aerated. The included ocular globes and orbital contents are non-suspicious. OTHER: None. MR CIRCLE OF WILLIS FINDINGS- moderately motion degraded examination . ANTERIOR CIRCULATION: Normal flow related enhancement of the included cervical, petrous, cavernous and supraclinoid internal carotid arteries. Patent anterior communicating artery. Flow related enhancement of the anterior and middle cerebral arteries, including distal segments. Moderate stenosis distal RIGHT M1 and severe RIGHT M2 origins. Moderate stenosis proximal LEFT M2 origin, superior division. No large vessel occlusion, abnormal luminal irregularity, aneurysm. POSTERIOR CIRCULATION: LEFT vertebral artery is dominant. Basilar artery is patent, with normal flow related enhancement of the main branch vessels. Mild luminal irregularity bilateral vertebral arteries compatible with atherosclerosis. Normal flow related enhancement of the posterior cerebral arteries. No large vessel occlusion, high-grade stenosis, abnormal luminal irregularity, aneurysm. ANATOMIC VARIANTS: None. MRA NECK FINDINGS- limited noncontrast, moderately motion degraded examination. ANTERIOR CIRCULATION: The common carotid arteries are widely patent bilaterally origins not imaged due to time-of-flight technique. The carotid bifurcation is patent bilaterally and there is no hemodynamically significant carotid stenosis by NASCET criteria. No evidence for atherosclerosis or flow limiting stenosis of the cervical internal carotid arteries. POSTERIOR CIRCULATION: Bilateral vertebral arteries are patent to the vertebrobasilar junction, origins not imaged. No evidence for atherosclerosis or flow limiting stenosis. Source images and MIP image were reviewed. IMPRESSION: MRI HEAD: Mildly motion degraded examination. Multifocal acute on subacute RIGHT MCA territory infarcts including RIGHT basal ganglia. Mild to moderate white matter changes  compatible chronic small vessel ischemic disease. MRA HEAD: Moderately motion degraded examination. No emergent large vessel occlusion. Severe stenosis RIGHT M2 origin. Moderate stenosis RIGHT M1 and LEFT M2 origin. MRA NECK: Negative noncontrast moderately motion degraded examination. Electronically Signed   By: Awilda Metro M.D.   On: 06/21/2016 21:18   Mr Brain Wo Contrast  Result Date: 06/21/2016 CLINICAL DATA:  LEFT facial droop and slurred speech beginning this morning. LEFT hand weakness. Follow-up code stroke. History of end-stage on dialysis, hypertension, hyperlipidemia and diabetes. EXAM: MR HEAD WITHOUT CONTRAST MR CIRCLE OF WILLIS WITHOUT CONTRAST MRA OF THE NECK WITHOUT CONTRAST TECHNIQUE: Multiplanar, multiecho pulse sequences of the brain, circle of Willis and surrounding structures were obtained without intravenous contrast. Angiographic images of the neck were obtained using MRA technique without intravenous contrast. COMPARISON:  CT HEAD June 21, 2016 at 1141 hours FINDINGS: MR HEAD FINDINGS- multiple sequences are mildly motion degraded. BRAIN: Subcentimeter focus of reduced diffusion RIGHT frontal and parietal lobes with low ADC values. Patchy reduced diffusion RIGHT basal ganglia and insula with normalizing ADC values in intermediate to bright FLAIR T2 hyperintense signal. No susceptibility artifact to suggest hemorrhage. The ventricles and sulci are normal for patient's age. Greater than expected scattered subcentimeter supratentorial white matter FLAIR T2 hyperintensities, exclusive of the aforementioned abnormality. No mass effect. No abnormal extra-axial fluid collections. VASCULAR: Normal major intracranial vascular flow voids present at skull base. SKULL AND UPPER CERVICAL SPINE: No abnormal sellar expansion. No suspicious calvarial bone marrow signal. Craniocervical junction maintained. SINUSES/ORBITS: Mild paranasal sinus mucosal thickening. Mastoid air cells are well  aerated. The included ocular globes and orbital contents are non-suspicious. OTHER: None. MR  CIRCLE OF WILLIS FINDINGS- moderately motion degraded examination . ANTERIOR CIRCULATION: Normal flow related enhancement of the included cervical, petrous, cavernous and supraclinoid internal carotid arteries. Patent anterior communicating artery. Flow related enhancement of the anterior and middle cerebral arteries, including distal segments. Moderate stenosis distal RIGHT M1 and severe RIGHT M2 origins. Moderate stenosis proximal LEFT M2 origin, superior division. No large vessel occlusion, abnormal luminal irregularity, aneurysm. POSTERIOR CIRCULATION: LEFT vertebral artery is dominant. Basilar artery is patent, with normal flow related enhancement of the main branch vessels. Mild luminal irregularity bilateral vertebral arteries compatible with atherosclerosis. Normal flow related enhancement of the posterior cerebral arteries. No large vessel occlusion, high-grade stenosis, abnormal luminal irregularity, aneurysm. ANATOMIC VARIANTS: None. MRA NECK FINDINGS- limited noncontrast, moderately motion degraded examination. ANTERIOR CIRCULATION: The common carotid arteries are widely patent bilaterally origins not imaged due to time-of-flight technique. The carotid bifurcation is patent bilaterally and there is no hemodynamically significant carotid stenosis by NASCET criteria. No evidence for atherosclerosis or flow limiting stenosis of the cervical internal carotid arteries. POSTERIOR CIRCULATION: Bilateral vertebral arteries are patent to the vertebrobasilar junction, origins not imaged. No evidence for atherosclerosis or flow limiting stenosis. Source images and MIP image were reviewed. IMPRESSION: MRI HEAD: Mildly motion degraded examination. Multifocal acute on subacute RIGHT MCA territory infarcts including RIGHT basal ganglia. Mild to moderate white matter changes compatible chronic small vessel ischemic disease. MRA  HEAD: Moderately motion degraded examination. No emergent large vessel occlusion. Severe stenosis RIGHT M2 origin. Moderate stenosis RIGHT M1 and LEFT M2 origin. MRA NECK: Negative noncontrast moderately motion degraded examination. Electronically Signed   By: Awilda Metro M.D.   On: 06/21/2016 21:18   Ct Head Code Stroke W/o Cm  Result Date: 06/21/2016 CLINICAL DATA:  Code stroke. Code stroke. Left-sided weakness and slurred speech. Facial droop. EXAM: CT HEAD WITHOUT CONTRAST TECHNIQUE: Contiguous axial images were obtained from the base of the skull through the vertex without intravenous contrast. COMPARISON:  None. FINDINGS: Brain: There is subtle loss of gray-white differentiation along the posterior right insula and in the right lentiform nucleus. Fall this could be artifact, early ischemia is certainly considered. Acute hemorrhage or mass lesion is present. Cortex over the convexities is within normal limits. The ventricles are of normal size. No significant extra-axial fluid collection is present. Vascular: Atherosclerotic calcifications are present within the cavernous internal carotid arteries. There is no hyperdense vessel. A calcification is evident near the right MCA bifurcation. There is no hyperdense vessel. Skull: The calvarium is within normal limits. Sinuses/Orbits: Chronic bilateral maxillary sinus disease is present. There is soft tissue opacification of the ostiomeatal complex bilaterally. This may represent polyp point disease. The globes and orbits are within normal limits. ASPECTS [facility] [place] Stroke Program Early CT Score) - Ganglionic level infarction (caudate, lentiform nuclei, internal capsule, insula, M1-M3 cortex): 5/7. Points lost for the posterior right insula and lentiform nucleus. - Supraganglionic infarction (M4-M6 cortex): 3/3 Total score (0-10 with 10 being normal): 8/10 IMPRESSION: 1. Subtle loss of gray-white differentiation in the posterior right insula and lentiform  nucleus suggesting acute ischemia. 2. Chronic bilateral maxillary sinus disease 3. ASPECTS is 8/10 Electronically Signed   By: Marin Roberts M.D.   On: 06/21/2016 11:58    Assessment/Plan 1.  Complication dialysis device with thrombosis AV access:  Patient's catheter dialysis access has malfunctioned. The patient will undergo angiography and placement of a new catheter.  The risks and benefits were described to the patient.  All questions were answered.  The patient  agrees to proceed. Potassium will be drawn to ensure that it is an appropriate level prior to performing intervention. 2.  End-stage renal disease requiring hemodialysis:  Patient will continue dialysis therapy without further interruption if a successful intervention is not achieved then a tunneled catheter will be placed. Dialysis has already been arranged. 3.  Hypertension:  Patient will continue medical management; nephrology is following no changes in oral medications. 4. Diabetes mellitus:  Glucose will be monitored and oral medications been held this morning once the patient has undergone the patient's procedure po intake will be reinitiated and again Accu-Cheks will be used to assess the blood glucose level and treat as needed. The patient will be restarted on the patient's usual hypoglycemic regime    Levora Dredge, MD  06/28/2016 2:14 PM

## 2016-06-28 NOTE — Progress Notes (Signed)
Called [facility],spoke with [facility] for appointment set-up to Dr. Pearlean Brownie.Dianne made a confirmed appointment on April 9,2018,Monday.Patient's contact number called Gloriajean Dell and made aware of this medical appointment.Doctor's name ,Telephone number and address of the clinic was given to patient's spouse.She asked this Clinical research associate tha her husband have already an appointment made to [facility]. [place] at [employer] this new appointment to this new doctor.This Clinical research associate explained to Patient spouse that Dr. Pearlean Brownie is a stroke especialist  Her husband needs to see the stroke doctor on his appointment date.Rosey Bath said that they are o.k with the appointment date.Reminded patient's wife that please read the book that I gave to them and if he has a symptoms appearing again before the appointment date ,immediately called [phone].Patient's wife understood instructions.

## 2016-06-29 ENCOUNTER — Telehealth: Payer: Self-pay

## 2016-06-29 ENCOUNTER — Encounter: Payer: Self-pay | Admitting: Vascular Surgery

## 2016-06-29 ENCOUNTER — Telehealth: Payer: Self-pay | Admitting: Physician Assistant

## 2016-06-29 NOTE — Telephone Encounter (Signed)
Error

## 2016-06-29 NOTE — Telephone Encounter (Signed)
-----   Message from Sondra Barges, PA-C sent at 06/29/2016  3:50 PM EST ----- Regarding: RE: Cardiac Monitor  After reviewing EP note by Dr. Ladona Ridgel, we should go ahead and get a 30-day event monitor set up for him.  ----- Message ----- From: Marilynne Halsted, RN Sent: 06/29/2016   1:57 PM To: Sondra Barges, PA-C Subject: FW: Cardiac Monitor                            Do you want him to put a monitor on before he sees your or wait until his appt to discuss?  ----- Message ----- From: Charlynn Grimes Sent: 06/29/2016   1:33 PM To: [facility] Triage Subject: Cardiac Monitor                                Pt is in the work que for a cardiac monitor  He was seen in hospital by Dr Elease Hashimoto but is coming on 07/10/16 to see Alycia Rossetti  Not sure if we need to set that up and or wait until he comes to see Alycia Rossetti in office Please advise

## 2016-06-29 NOTE — Telephone Encounter (Signed)
Spoke w/ pt.  Advised him of Ryan's recommendation. He is agreeable to wearing 30 day monitor.  Advised him that [employer] will contact him to verify his address before mailing monitor to his home. He is appreciative and will keep upcoming appt on 07/10/16.

## 2016-07-04 ENCOUNTER — Other Ambulatory Visit: Payer: Self-pay

## 2016-07-04 NOTE — Patient Outreach (Signed)
[facility]) Care Management  07/04/2016  Justin Fitzgerald 16-May-1960 [id number]     EMMI-Stroke RED ON EMMI ALERT Day # 6 Date: 07/03/16 Red Alert Reason: " Smoked or been around smoke? Yes"   Outreach attempt #1 to patient. Spoke briefly with patient as he was currently at dialysis getting treatment. Advised that RN CM would call back at another time to follow up with him.    Plan: RN CM will make outreach attempt to patient within one business day.   Antionette Fairy, RN,BSN,CCM [facility] Care Management Telephonic Care Management Coordinator Direct Phone: [phone] Toll Free: [phone] Fax: [phone]

## 2016-07-05 ENCOUNTER — Ambulatory Visit (INDEPENDENT_AMBULATORY_CARE_PROVIDER_SITE_OTHER): Payer: Medicare Other

## 2016-07-05 ENCOUNTER — Ambulatory Visit (INDEPENDENT_AMBULATORY_CARE_PROVIDER_SITE_OTHER): Payer: Medicare Other | Admitting: Vascular Surgery

## 2016-07-05 ENCOUNTER — Other Ambulatory Visit: Payer: Self-pay

## 2016-07-05 DIAGNOSIS — N184 Chronic kidney disease, stage 4 (severe): Secondary | ICD-10-CM | POA: Diagnosis not present

## 2016-07-05 NOTE — Patient Outreach (Signed)
[facility]) Care Management  07/05/2016  Justin Fitzgerald 11-Jun-1960 [id number]   EMMI-Stroke RED ON EMMI ALERT Day # 6 Date: 07/03/16 Red Alert Reason: " Smoked or been around smoke? Yes"  Outreach attempt #2 to patient. Spoke with patient. He states that he is doing good since discharge from the hospital. Patient aware of smoking implications and increased risk of stroke and/or cardiac event. He declined any further education on this. He state that he had dialysis treatment on yesterday. He has PCP appt on next week as well as appt with Eduard Clos. He denies any issues with transportation. Patient reports he has all his meds and has no issues managing and affording meds. No further RN CM needs or concerns at this time. Advised patient that he would continue to get automated EMMI Stroke post discharge calls for duration of program and will receive a call from a nurse if any of his responses are abnormal.    Plan: RN CM will notify [facility] administrative assistant of case status.  Antionette Fairy, RN,BSN,CCM [facility] Care Management Telephonic Care Management Coordinator Direct Phone: [phone] Toll Free: [phone] Fax: [phone]

## 2016-07-10 ENCOUNTER — Ambulatory Visit (INDEPENDENT_AMBULATORY_CARE_PROVIDER_SITE_OTHER): Payer: Medicare Other | Admitting: Physician Assistant

## 2016-07-10 ENCOUNTER — Ambulatory Visit (INDEPENDENT_AMBULATORY_CARE_PROVIDER_SITE_OTHER): Payer: Medicare Other

## 2016-07-10 ENCOUNTER — Encounter: Payer: Self-pay | Admitting: Physician Assistant

## 2016-07-10 VITALS — BP 102/60 | HR 88 | Ht 72.0 in | Wt 316.2 lb

## 2016-07-10 DIAGNOSIS — I2510 Atherosclerotic heart disease of native coronary artery without angina pectoris: Secondary | ICD-10-CM | POA: Diagnosis not present

## 2016-07-10 DIAGNOSIS — I639 Cerebral infarction, unspecified: Secondary | ICD-10-CM | POA: Diagnosis not present

## 2016-07-10 DIAGNOSIS — I5042 Chronic combined systolic (congestive) and diastolic (congestive) heart failure: Secondary | ICD-10-CM

## 2016-07-10 DIAGNOSIS — I5189 Other ill-defined heart diseases: Secondary | ICD-10-CM

## 2016-07-10 DIAGNOSIS — N186 End stage renal disease: Secondary | ICD-10-CM

## 2016-07-10 DIAGNOSIS — I10 Essential (primary) hypertension: Secondary | ICD-10-CM | POA: Diagnosis not present

## 2016-07-10 NOTE — Patient Instructions (Signed)
Medication Instructions:  Your physician recommends that you continue on your current medications as directed. Please refer to the Current Medication list given to you today.   Labwork: none   Testing/Procedures: Cardiac CT Angiography (CTA), is a special type of CT scan that uses a computer to produce multi-dimensional views of major blood vessels throughout the body. In CT angiography, a contrast material is injected through an IV to help visualize the blood vessels.  - This will be done at [facility]. - Someone will call you from scheduling. It may take a week to process and file with your insurance before you hear from scheduler.    Follow-Up: Your physician recommends that you schedule a follow-up appointment in: 1 MONTH WITH DR Randall An, PA.  If you need a refill on your cardiac medications before your next appointment, please call your pharmacy.

## 2016-07-10 NOTE — Progress Notes (Signed)
Cardiology Office Note Date:  07/10/2016  Patient ID:  Justin Fitzgerald 1960/07/09, MRN [id number] PCP:  Dorothey Baseman, MD  Cardiologist:  Dr. Mariah Milling, MD (last saw 2011)    Chief Complaint: Hospital follow up  History of Present Illness: Justin Fitzgerald is a 56 y.o. 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   Patient was previously seen by Dr. Mariah Milling in 2011 however notes are not on file for review. Patient was recently admitted to [facility] in late December for acute hypoxic respiratory failure requiring oxygen supplementation in the setting of COPD exacerbation. He was treated with IV steroids and nebulizers and antibiotics. Hospital admission was complicated with peritonitis requiring antibiotic therapy. He was noted to have a mildly elevated troponin at that time with a peak of 0.76. This was felt to be secondary to demand ischemia by the primary team. Cardiology was not consulted at that time.   Patient was admitted February 7th with stroke after developing left facial droop and slurring of his speech shortly after waking up. He did not receive IV TPA as he was outside the treatment window. MRI brain demonstrated right MCA territory infarct felt to be embolic secondary to unknown etiology. MRA neck was negative. Lower extremity dopplers were negative for DVT. Echo showed newly diagnosed systolic heart failure with EF of 20-25%, diffuse hypokinesis, G1DD, mild aortic stenosis, aortic root mildly dilated, mildly to moderately calcified mitral annulus, left atrium moderately dilated. There was concern for potential cardioembolic  source of his stroke thus he underwent TEE that showed a walled off region next to the aorta between the left and noncoronary cusps of the aortic valve but was felt to possibly represent a remote abscess. There did not appear to be communication with the aorta. Cardiac CT was recommended to evaluate this further as an outpatient. There was no evidence of thrombus in the biatrial appendage or cavity. No PFO. He underwent right and left heart catheterization on February 9 that showed heavily calcified coronary arteries with chronic occlusion of OM1, moderate diffuse RCA stenosis, and mild nonobstructive LAD stenosis. Normal right heart hemodynamics, normal LVEDP. It was recommended to continue medical therapy for his coronary artery disease and cardiomyopathy. There were no targets for PCI, it was suspected his cardiomyopathy was primarily of nonischemic etiology. He was seen by EP for evaluation of possible ILR. The patient preferred to wear a 30 day event monitor initially to evaluate for possible arrhythmia rather than proceeding directly with an ILR for evaluation of his cryptogenic stroke. While inpatient he was noted to have some asymptomatic NSVT, beta blocker was titrated as tolerated.   Cardiac discharge medications included: aspirin 81 mg, Plavix 75 mg, Lipitor 80 mg, Coreg 3.125 mg twice a day, and Lasix 160 mg twice a day.   Right and left heart catheterization to 06/23/2016: Mid LAD 40% stenosed with calcified lesion, diffuse calcific LAD stenosis, nonobstructive. OM1 CTO. Proximal RCA 60% stenosed, mid to distal RCA 50% stenosed. Right heart catheterization showed a mean right atrial pressure of 6 mmHg, mean pulmonary capillary wedge pressure of 9 mmHg, LVEDP 14 mmHg, cardiac output 3.6.   He comes in  today doing well. He has noted some intermittent shortness of breath with exertion, though this is stable. He is uncertain what his dry weight is to be, reports approximately 135 kg. He undergoes HD  every Tuesday, Thursday, and Saturday with a reported removal of 4 kg goal each session. He is tolerating Coreg 3.125 mg twice a day, ASA 81 mg daily, Plavix 75 mg daily without issues. He frequently forgets to take his twice a day Lasix. He does not weigh himself at home any longer since he has transitioned to HD. He does check blood pressures regularly with systolic readings in the low 100s to 1-teens. He has stable 3 pillow orthopnea. No early satiety. No cough. He has chronic lower extremity swelling for which she intermittently uses compression stockings. No chest pain.   Past Medical History:  Diagnosis Date  . ESRD (end stage renal disease) on dialysis (HCC)    "TTS; Justin Ditto, Fitzgerald" (06/22/2016)  . History of gout   . Hyperlipidemia   . Hypertension   . Peripheral vascular disease (HCC)   . Stroke (HCC) 06/20/2016   denies residual on 06/22/2016  . Type II diabetes mellitus (HCC)     Past Surgical History:  Procedure Laterality Date  . DIALYSIS/PERMA CATHETER INSERTION N/A 06/28/2016   Procedure: Dialysis/Perma Catheter Insertion;  Surgeon: Renford Dills, MD;  Location: [facility] INVASIVE CV LAB;  Service: Cardiovascular;  Laterality: N/A;  . EXTERIORIZATION OF A CONTINUOUS AMBULATORY PERITONEAL DIALYSIS CATHETER    . FRACTURE SURGERY    . PERIPHERAL VASCULAR CATHETERIZATION N/A 03/08/2016   Procedure: Dialysis/Perma Catheter Insertion;  Surgeon: Renford Dills, MD;  Location: [facility] INVASIVE CV LAB;  Service: Cardiovascular;  Laterality: N/A;  . PERIPHERAL VASCULAR CATHETERIZATION N/A 04/20/2016   Procedure: Dialysis/Perma Catheter Removal;  Surgeon: Annice Needy, MD;  Location: [facility] INVASIVE CV LAB;  Service: Cardiovascular;  Laterality: N/A;  . PERIPHERAL VASCULAR CATHETERIZATION N/A 05/25/2016   Procedure: Dialysis/Perma Catheter Insertion;  Surgeon: Annice Needy, MD;  Location: [facility] INVASIVE CV LAB;  Service: Cardiovascular;  Laterality: N/A;  . RIGHT/LEFT HEART CATH AND CORONARY  ANGIOGRAPHY N/A 06/23/2016   Procedure: Right/Left Heart Cath and Coronary Angiography;  Surgeon: [name], MD;  [facility] INVAS[facility] CV LAB;  Service: Cardiovascular;  Laterality: N/A;  . TEE WITHOUT CARDIOVERSION N/A 06/26/2016   Procedure: TRANSESOPHAGEAL ECHOCARDIOGRAM (TEE);  Surgeon: Pricilla Riffle, MD;  Location: [facility] ENDOSCOPY;  Service: Cardiovascular;  Laterality: N/A;  . WRIST FRACTURE SURGERY Right ~ 2000   "got 8 screws in there"    Current Outpatient Prescriptions  Medication Sig Dispense Refill  . aspirin EC 81 MG EC tablet Take 1 tablet (81 mg total) by mouth daily. 90 tablet 3  . atorvastatin (LIPITOR) 80 MG tablet Take 1 tablet (80 mg total) by mouth daily at 6 PM. 60 tablet 1  . beclomethasone (QVAR) 40 MCG/ACT inhaler Inhale 1 puff into the lungs 2 (two) times daily. 1 Inhaler 0  . carvedilol (COREG) 3.125 MG tablet Take 1 tablet (3.125 mg total) by mouth 2 (two) times daily with a meal. 60 tablet 1  . cinacalcet (SENSIPAR) 30 MG tablet Take 4 tablets (120 mg total) by mouth daily with supper. 120 tablet 0  . clopidogrel (PLAVIX) 75 MG tablet Take 75 mg by mouth daily.    . furosemide (LASIX) 80 MG tablet Take 160 mg by mouth 2 (two) times daily.    [name] gabapentin (NEURONTIN) 300 MG capsule Take 300 mg by mouth at  bedtime.     . sevelamer carbonate (RENVELA) 2.4 g PACK Take 2.4 g by mouth 3 (three) times daily with meals. 90 each 0  . SPIRIVA HANDIHALER 18 MCG inhalation capsule Place 18 mcg into inhaler and inhale daily.      No current facility-administered medications for this visit.     Allergies:   Ramipril and Aspirin   Social History:  The patient  reports that he has been smoking Cigarettes.  He has a 20.00 pack-year smoking history. He has quit using smokeless tobacco. His smokeless tobacco use included Chew. He reports that he does not drink alcohol or use drugs.   Family History:  The patient's family history includes CAD in his mother; Hypertension in his  mother.  ROS:   Review of Systems  Constitutional: Positive for malaise/fatigue. Negative for chills, diaphoresis, fever and weight loss.  HENT: Negative for congestion.   Eyes: Negative for discharge and redness.  Respiratory: Positive for shortness of breath. Negative for cough, hemoptysis, sputum production and wheezing.   Cardiovascular: Negative for chest pain, palpitations, orthopnea, claudication, leg swelling and PND.  Gastrointestinal: Negative for abdominal pain, blood in stool, heartburn, melena, nausea and vomiting.  Genitourinary: Negative for hematuria.  Musculoskeletal: Negative for falls and myalgias.  Skin: Negative for rash.  Neurological: Positive for weakness. Negative for dizziness, tingling, tremors, sensory change, speech change, focal weakness and loss of consciousness.  Endo/Heme/Allergies: Does not bruise/bleed easily.  Psychiatric/Behavioral: Negative for substance abuse. The patient is not nervous/anxious.      PHYSICAL EXAM:  VS:  BP 102/60 (BP Location: Right Arm, Patient Position: Sitting, Cuff Size: Normal)   Pulse 88   Ht 6' (1.829 m)   Wt (!) 316 lb 4 oz (143.5 kg)   BMI 42.89 kg/m  BMI: Body mass index is 42.89 kg/m.  Physical Exam  Constitutional: He is oriented to person, place, and time. He appears well-developed and well-nourished.  HENT:  Head: Normocephalic and atraumatic.  Eyes: Right eye exhibits no discharge. Left eye exhibits no discharge.  Neck: Normal range of motion. No JVD present.  Cardiovascular: Normal rate, regular rhythm, S1 normal and S2 normal.  Exam reveals no distant heart sounds, no friction rub, no midsystolic click and no opening snap.   Murmur heard.  Harsh midsystolic murmur is present with a grade of 2/6  at the upper right sternal border radiating to the neck Pulmonary/Chest: Effort normal and breath sounds normal. No respiratory distress. He has no decreased breath sounds. He has no wheezes. He has no rales. He  exhibits no tenderness.  Abdominal: Soft. He exhibits no distension. There is no tenderness.  Obese   Musculoskeletal: He exhibits no edema.  Neurological: He is alert and oriented to person, place, and time.  Skin: Skin is warm and dry. No cyanosis. Nails show no clubbing.  Psychiatric: He has a normal mood and affect. His speech is normal and behavior is normal. Judgment and thought content normal.     EKG:  Was ordered and interpreted by me today. Shows NSR, 88 bpm, prior inferior infarct, TWI leads aVL, V3-V5  Recent Labs: 06/21/2016: ALT 14 06/23/2016: TSH 2.302 06/24/2016: BUN 25; Creatinine, Ser 7.43; Potassium 3.8; Sodium 134 06/26/2016: Hemoglobin 8.8; Platelets 193  06/22/2016: Cholesterol 170; HDL 35; LDL Cholesterol 98; Total CHOL/HDL Ratio 4.9; Triglycerides 185; VLDL 37   Estimated Creatinine Clearance: 16.1 mL/min (by C-G formula based on SCr of 7.43 mg/dL (H)).   Wt Readings from Last 3 Encounters:  07/10/16 (!) 316 lb 4 oz (143.5 kg)  06/28/16 (!) 306 lb (138.8 kg)  06/24/16 (!) 306 lb 6.4 oz (139 kg)     Other studies reviewed: Additional studies/records reviewed today include: summarized above  ASSESSMENT AND PLAN:  1. Chronic combined CHF/mostly NICM:Volume status is difficult to assess given his body habitus. However, his weight is up 10 pounds from his weight on February 14. He has not missed any dialysis sessions. He makes minimal urine per his report. Volume managed by HD on Tuesday, Thursday, Saturday along with Lasix 80 mg twice a day, which he takes intermittently. Advised patient to weigh self daily and call for weight gain greater than 3 pounds overnight or 5 pounds in a 7 day time spent. CHF education provided. Perhaps he would benefit from cardiac or pulmonary rehabilitation moving forward. For now, continue Coreg 3.125 mg twice a day. Unable to add Imdur/hydralazine, ARB/ACEi, or spironolactone given soft blood pressure with reported hypotension during dialysis  as well as renal disease. This was discussed with him in the office. Handicap form will be filled out for him at this time.  2. Abnormal TEE finding: TEE done at [facility] showed a walled off region next to the aorta between the left and noncoronary cusps of the aortic valve that may represent a remote abscess. It was recommended the patient undergo cardiac CT to further evaluate this area of concern. This was discussed with him at his office visit today and he is willing to proceed with cardiac CT. We will schedule this for him.  3. CAD:No symptoms concerning for angina. Continue dual antiplatelet therapy with aspirin and Plavix daily. No further ischemic workup at this time.  4. Cryptogenic stroke:Has not been wearing event monitor since his hospital discharge as he did not know how to set this up. He denies any noted palpitations. Our office staff will set up his event monitor prior to leaving the office today. Should this outpatient cardiac monitor did not show any significant arrhythmia concerning for etiology of his stroke he may require further EP evaluation to discuss possible implantation of ILR. He understands this and is willing to proceed if needed.  5. Labile HTN:Has noted some drops in his systolic blood pressure during dialysis. Currently with a systolic blood pressure of 10 2 mmHg. Because of his somewhat soft blood pressure/renal disease we are unable to add further heart failure medications at this time including Imdur/hydralazine, retrial of ARB/entresto, ACE inhibitor, or spironolactone. For now, continue Coreg 3.125 mg twice a day and monitor blood pressure daily.  6. ESRD on HD:Per renal. Dialysis on Tuesday, Thursday, Saturday. Will need to contact nephrology office, Dr. Thedore Mins, to find out what his dry weight is.  Disposition: F/u with Dr. Mariah Milling or myself in 1 month.   Current medicines are reviewed at length with the patient today.  The patient did not have any concerns regarding  medicines.  Elinor Dodge PA-C 07/10/2016 2:34 PM     [facility] [address] [place], [place] [address] [phone]

## 2016-07-11 ENCOUNTER — Encounter: Payer: Self-pay | Admitting: Physician Assistant

## 2016-07-11 ENCOUNTER — Other Ambulatory Visit: Payer: Self-pay

## 2016-07-11 NOTE — Patient Outreach (Signed)
[facility]) Care Management  07/11/2016  Austin Broadwater 15-Oct-1960 [id number]     EMMI- Stroke RED ON EMMI ALERT Day # 13 Date: 07/10/16 Red Alert Reason: "Went to follow up appointment? No"    Outreach attempt #1 to patient. No answer at present. RN CM left HIPAA compliant voicemail message along with contact info.    Plan: RN CM will make outreach attempt to patient within one business day if no return call from patient.   Antionette Fairy, RN,BSN,CCM [facility] Care Management Telephonic Care Management Coordinator Direct Phone: [phone] Toll Free: [phone] Fax: [phone]

## 2016-07-12 ENCOUNTER — Other Ambulatory Visit: Payer: Self-pay

## 2016-07-12 NOTE — Patient Outreach (Signed)
[facility]) Care Management  07/12/2016  Cledith Sein 04-Nov-1960 [id number]   EMMI- Stroke RED ON EMMI ALERT Day # 13 Date: 07/10/16 Red Alert Reason: "Went to follow up appointment? No"    Outreach attempt #2 to patient. Spoke with patient. Addressed and reviewed red alert. Patient reports he saw cardiologist on 07/10/16. He has PCP appt in March. He states that MD office aware of recent hospitalization and he was told it was okay to keep March appt unless he developed any changes in his condition then he would need to be seen sooner. Patient has completed automated EMMI post discharge calls. He denies any RN CM needs or concerns.    Plan: RN CM will notify [facility] administrative assistant of case status.   Antionette Fairy, RN,BSN,CCM [facility] Care Management Telephonic Care Management Coordinator Direct Phone: [phone] Toll Free: [phone] Fax: [phone]

## 2016-07-17 ENCOUNTER — Encounter (INDEPENDENT_AMBULATORY_CARE_PROVIDER_SITE_OTHER): Payer: Self-pay | Admitting: Vascular Surgery

## 2016-07-17 ENCOUNTER — Encounter (INDEPENDENT_AMBULATORY_CARE_PROVIDER_SITE_OTHER): Payer: Self-pay

## 2016-07-17 ENCOUNTER — Other Ambulatory Visit (INDEPENDENT_AMBULATORY_CARE_PROVIDER_SITE_OTHER): Payer: Self-pay | Admitting: Vascular Surgery

## 2016-07-17 ENCOUNTER — Ambulatory Visit (INDEPENDENT_AMBULATORY_CARE_PROVIDER_SITE_OTHER): Payer: Medicare Other | Admitting: Vascular Surgery

## 2016-07-17 VITALS — BP 137/78 | HR 91 | Resp 17 | Ht 72.0 in | Wt 312.0 lb

## 2016-07-17 DIAGNOSIS — E1122 Type 2 diabetes mellitus with diabetic chronic kidney disease: Secondary | ICD-10-CM | POA: Diagnosis not present

## 2016-07-17 DIAGNOSIS — I10 Essential (primary) hypertension: Secondary | ICD-10-CM

## 2016-07-17 DIAGNOSIS — N186 End stage renal disease: Secondary | ICD-10-CM

## 2016-07-17 DIAGNOSIS — T8090XS Unspecified complication following infusion and therapeutic injection, sequela: Secondary | ICD-10-CM | POA: Diagnosis not present

## 2016-07-17 DIAGNOSIS — E782 Mixed hyperlipidemia: Secondary | ICD-10-CM

## 2016-07-17 DIAGNOSIS — N185 Chronic kidney disease, stage 5: Secondary | ICD-10-CM

## 2016-07-17 DIAGNOSIS — I639 Cerebral infarction, unspecified: Secondary | ICD-10-CM

## 2016-07-18 DIAGNOSIS — E785 Hyperlipidemia, unspecified: Secondary | ICD-10-CM | POA: Insufficient documentation

## 2016-07-18 DIAGNOSIS — T8090XA Unspecified complication following infusion and therapeutic injection, initial encounter: Secondary | ICD-10-CM | POA: Insufficient documentation

## 2016-07-18 NOTE — Progress Notes (Signed)
MRN : [id number]  Justin Fitzgerald is a 56 y.o. (1960/11/03) male who presents with chief complaint of  Chief Complaint  Patient presents with  . Re-evaluation    Discuss ultrasound from 2/21  .  History of Present Illness:  The patient is seen for evaluation of dialysis access.  The patient has a history of failed accesses.  Currently his PD catheter is not functioning.  Current access is via a catheter which is functioning adequately.  There have not been any episodes of catheter infection.  The patient denies amaurosis fugax or recent TIA symptoms. There are no recent neurological changes noted. The patient denies claudication symptoms or rest pain symptoms. The patient denies history of DVT, PE or superficial thrombophlebitis. The patient denies recent episodes of angina or shortness of breath.   Vein mapping shows adequate left cephalic vein for fistula.  No outpatient prescriptions have been marked as taking for the 07/17/16 encounter (Office Visit) with Renford Dills, MD.    Past Medical History:  Diagnosis Date  . ESRD (end stage renal disease) on dialysis (HCC)    "TTS; [facility] [place]" (06/22/2016)  . History of gout   . Hyperlipidemia   . Hypertension   . Peripheral vascular disease (HCC)   . Stroke (HCC) 06/20/2016   denies residual on 06/22/2016  . Type II diabetes mellitus (HCC)     Past Surgical History:  Procedure Laterality Date  . DIALYSIS/PERMA CATHETER INSERTION N/A 06/28/2016   Procedure: Dialysis/Perma Catheter Insertion;  Surgeon: Renford Dills, MD;  Location: [facility] INVASIVE CV LAB;  Service: Cardiovascular;  Laterality: N/A;  . EXTERIORIZATION OF A CONTINUOUS AMBULATORY PERITONEAL DIALYSIS CATHETER    . FRACTURE SURGERY    . PERIPHERAL VASCULAR CATHETERIZATION N/A 03/08/2016   Procedure: Dialysis/Perma Catheter Insertion;  Surgeon: Renford Dills, MD;  Location: [facility] INVASIVE CV LAB;  Service: Cardiovascular;  Laterality: N/A;  .  PERIPHERAL VASCULAR CATHETERIZATION N/A 04/20/2016   Procedure: Dialysis/Perma Catheter Removal;  Surgeon: Annice Needy, MD;  Location: [facility] INVASIVE CV LAB;  Service: Cardiovascular;  Laterality: N/A;  . PERIPHERAL VASCULAR CATHETERIZATION N/A 05/25/2016   Procedure: Dialysis/Perma Catheter Insertion;  Surgeon: Annice Needy, MD;  Location: [facility] INVASIVE CV LAB;  Service: Cardiovascular;  Laterality: N/A;  . RIGHT/LEFT HEART CATH AND CORONARY ANGIOGRAPHY N/A 06/23/2016   Procedure: Right/Left Heart Cath and Coronary Angiography;  Surgeon: [name], MD;  Location: [facility] INVASIVE CV LAB;  Service: Cardiovascular;  Laterality: N/A;  . TEE WITHOUT CARDIOVERSION N/A 06/26/2016   Procedure: TRANSESOPHAGEAL ECHOCARDIOGRAM (TEE);  Surgeon: Pricilla Riffle, MD;  Location: [facility] ENDOSCOPY;  Service: Cardiovascular;  Laterality: N/A;  . WRIST FRACTURE SURGERY Right ~ 2000   "got 8 screws in there"    Social History Social History  Substance Use Topics  . Smoking status: Current Every Day Smoker    Packs/day: 0.50    Years: 40.00    Types: Cigarettes  . Smokeless tobacco: Former User    Types: Chew     Comment: 06/22/2016 "quit chewing when I was 3 or 56 years old"  . Alcohol use No    Family History Family History  Problem Relation Age of Onset  . Hypertension Mother   . CAD Mother   No family history of bleeding/clotting disorders, porphyria or autoimmune disease   Allergies  Allergen Reactions  . Ramipril Shortness Of Breath  . Aspirin     kidney disease     REVIEW OF SYSTEMS (Negative  unless checked)  Constitutional: [] Weight loss  [] Fever  [] Chills Cardiac: [] Chest pain   [] Chest pressure   [] Palpitations   [] Shortness of breath when laying flat   [] Shortness of breath with exertion. Vascular:  [] Pain in legs with walking   [] Pain in legs at rest  [] History of DVT   [] Phlebitis   [] Swelling in legs   [] Varicose veins   [] Non-healing ulcers Pulmonary:   [] Uses home oxygen   [] Productive  cough   [] Hemoptysis   [] Wheeze  [] COPD   [] Asthma Neurologic:  [] Dizziness   [] Seizures   [] History of stroke   [] History of TIA  [] Aphasia   [] Vissual changes   [] Weakness or numbness in arm   [] Weakness or numbness in leg Musculoskeletal:   [] Joint swelling   [] Joint pain   [] Low back pain Hematologic:  [] Easy bruising  [] Easy bleeding   [] Hypercoagulable state   [] Anemic Gastrointestinal:  [] Diarrhea   [] Vomiting  [] Gastroesophageal reflux/heartburn   [] Difficulty swallowing. Genitourinary:  [x] Chronic kidney disease   [] Difficult urination  [] Frequent urination   [] Blood in urine Skin:  [] Rashes   [] Ulcers  Psychological:  [] History of anxiety   []  History of major depression.  Physical Examination  Vitals:   07/17/16 1051  BP: 137/78  Pulse: 91  Resp: 17  Weight: (!) 141.5 kg (312 lb)  Height: 6' (1.829 m)   Body mass index is 42.31 kg/m. Gen: WD/WN, NAD Head: [place]/AT, No temporalis wasting.  Ear/Nose/Throat: Hearing grossly intact, nares w/o erythema or drainage, poor dentition Eyes: PER, EOMI, sclera nonicteric.  Neck: Supple, no masses.  No bruit or JVD.  Pulmonary:  Good air movement, clear to auscultation bilaterally, no use of accessory muscles.  Cardiac: RRR, normal S1, S2, no Murmurs. Vascular: right IJ cath CD&I  PD is intact and nontender Vessel Right Left  Radial Palpable Palpable  Ulnar Palpable Palpable  Brachial Palpable Palpable  Carotid Palpable Palpable  Femoral Palpable Palpable  Popliteal Palpable Palpable  PT Palpable Palpable  DP Palpable Palpable   Gastrointestinal: soft, non-distended. No guarding/no peritoneal signs.  Musculoskeletal: M/S 5/5 throughout.  No deformity or atrophy.  Neurologic: CN 2-12 intact. Pain and light touch intact in extremities.  Symmetrical.  Speech is fluent. Motor exam as listed above. Psychiatric: Judgment intact, Mood & affect appropriate for pt's clinical situation. Dermatologic: No rashes or ulcers noted.  No changes  consistent with cellulitis. Lymph : No Cervical lymphadenopathy, no lichenification or skin changes of chronic lymphedema.  CBC Lab Results  Component Value Date   WBC 7.6 06/26/2016   HGB 8.8 (L) 06/26/2016   HCT 27.9 (L) 06/26/2016   MCV 90.9 06/26/2016   PLT 193 06/26/2016    BMET    Component Value Date/Time   NA 134 (L) 06/24/2016 0932   K 3.8 06/24/2016 0932   CL 97 (L) 06/24/2016 0932   CO2 26 06/24/2016 0932   GLUCOSE 115 (H) 06/24/2016 0932   BUN 25 (H) 06/24/2016 0932   CREATININE 7.43 (H) 06/24/2016 0932   CALCIUM 8.4 (L) 06/24/2016 0932   GFRNONAA 7 (L) 06/24/2016 0932   GFRAA 8 (L) 06/24/2016 0932   CrCl cannot be calculated (Patient's most recent lab result is older than the maximum 21 days allowed.).  COAG Lab Results  Component Value Date   INR 0.97 06/21/2016    Radiology Mr Maxine Glenn Head Wo Contrast  Result Date: 06/21/2016 CLINICAL DATA:  LEFT facial droop and slurred speech beginning this morning. LEFT hand weakness. Follow-up code  stroke. History of end-stage on dialysis, hypertension, hyperlipidemia and diabetes. EXAM: MR HEAD WITHOUT CONTRAST MR CIRCLE OF WILLIS WITHOUT CONTRAST MRA OF THE NECK WITHOUT CONTRAST TECHNIQUE: Multiplanar, multiecho pulse sequences of the brain, circle of Willis and surrounding structures were obtained without intravenous contrast. Angiographic images of the neck were obtained using MRA technique without intravenous contrast. COMPARISON:  CT HEAD June 21, 2016 at 1141 hours FINDINGS: MR HEAD FINDINGS- multiple sequences are mildly motion degraded. BRAIN: Subcentimeter focus of reduced diffusion RIGHT frontal and parietal lobes with low ADC values. Patchy reduced diffusion RIGHT basal ganglia and insula with normalizing ADC values in intermediate to bright FLAIR T2 hyperintense signal. No susceptibility artifact to suggest hemorrhage. The ventricles and sulci are normal for patient's age. Greater than expected scattered  subcentimeter supratentorial white matter FLAIR T2 hyperintensities, exclusive of the aforementioned abnormality. No mass effect. No abnormal extra-axial fluid collections. VASCULAR: Normal major intracranial vascular flow voids present at skull base. SKULL AND UPPER CERVICAL SPINE: No abnormal sellar expansion. No suspicious calvarial bone marrow signal. Craniocervical junction maintained. SINUSES/ORBITS: Mild paranasal sinus mucosal thickening. Mastoid air cells are well aerated. The included ocular globes and orbital contents are non-suspicious. OTHER: None. MR CIRCLE OF WILLIS FINDINGS- moderately motion degraded examination . ANTERIOR CIRCULATION: Normal flow related enhancement of the included cervical, petrous, cavernous and supraclinoid internal carotid arteries. Patent anterior communicating artery. Flow related enhancement of the anterior and middle cerebral arteries, including distal segments. Moderate stenosis distal RIGHT M1 and severe RIGHT M2 origins. Moderate stenosis proximal LEFT M2 origin, superior division. No large vessel occlusion, abnormal luminal irregularity, aneurysm. POSTERIOR CIRCULATION: LEFT vertebral artery is dominant. Basilar artery is patent, with normal flow related enhancement of the main branch vessels. Mild luminal irregularity bilateral vertebral arteries compatible with atherosclerosis. Normal flow related enhancement of the posterior cerebral arteries. No large vessel occlusion, high-grade stenosis, abnormal luminal irregularity, aneurysm. ANATOMIC VARIANTS: None. MRA NECK FINDINGS- limited noncontrast, moderately motion degraded examination. ANTERIOR CIRCULATION: The common carotid arteries are widely patent bilaterally origins not imaged due to time-of-flight technique. The carotid bifurcation is patent bilaterally and there is no hemodynamically significant carotid stenosis by NASCET criteria. No evidence for atherosclerosis or flow limiting stenosis of the cervical  internal carotid arteries. POSTERIOR CIRCULATION: Bilateral vertebral arteries are patent to the vertebrobasilar junction, origins not imaged. No evidence for atherosclerosis or flow limiting stenosis. Source images and MIP image were reviewed. IMPRESSION: MRI HEAD: Mildly motion degraded examination. Multifocal acute on subacute RIGHT MCA territory infarcts including RIGHT basal ganglia. Mild to moderate white matter changes compatible chronic small vessel ischemic disease. MRA HEAD: Moderately motion degraded examination. No emergent large vessel occlusion. Severe stenosis RIGHT M2 origin. Moderate stenosis RIGHT M1 and LEFT M2 origin. MRA NECK: Negative noncontrast moderately motion degraded examination. Electronically Signed   By: Awilda Metro M.D.   On: 06/21/2016 21:18   Mr Maxine Glenn Neck Wo Contrast  Result Date: 06/21/2016 CLINICAL DATA:  LEFT facial droop and slurred speech beginning this morning. LEFT hand weakness. Follow-up code stroke. History of end-stage on dialysis, hypertension, hyperlipidemia and diabetes. EXAM: MR HEAD WITHOUT CONTRAST MR CIRCLE OF WILLIS WITHOUT CONTRAST MRA OF THE NECK WITHOUT CONTRAST TECHNIQUE: Multiplanar, multiecho pulse sequences of the brain, circle of Willis and surrounding structures were obtained without intravenous contrast. Angiographic images of the neck were obtained using MRA technique without intravenous contrast. COMPARISON:  CT HEAD June 21, 2016 at 1141 hours FINDINGS: MR HEAD FINDINGS- multiple sequences are mildly motion degraded. BRAIN:  Subcentimeter focus of reduced diffusion RIGHT frontal and parietal lobes with low ADC values. Patchy reduced diffusion RIGHT basal ganglia and insula with normalizing ADC values in intermediate to bright FLAIR T2 hyperintense signal. No susceptibility artifact to suggest hemorrhage. The ventricles and sulci are normal for patient's age. Greater than expected scattered subcentimeter supratentorial white matter FLAIR T2  hyperintensities, exclusive of the aforementioned abnormality. No mass effect. No abnormal extra-axial fluid collections. VASCULAR: Normal major intracranial vascular flow voids present at skull base. SKULL AND UPPER CERVICAL SPINE: No abnormal sellar expansion. No suspicious calvarial bone marrow signal. Craniocervical junction maintained. SINUSES/ORBITS: Mild paranasal sinus mucosal thickening. Mastoid air cells are well aerated. The included ocular globes and orbital contents are non-suspicious. OTHER: None. MR CIRCLE OF WILLIS FINDINGS- moderately motion degraded examination . ANTERIOR CIRCULATION: Normal flow related enhancement of the included cervical, petrous, cavernous and supraclinoid internal carotid arteries. Patent anterior communicating artery. Flow related enhancement of the anterior and middle cerebral arteries, including distal segments. Moderate stenosis distal RIGHT M1 and severe RIGHT M2 origins. Moderate stenosis proximal LEFT M2 origin, superior division. No large vessel occlusion, abnormal luminal irregularity, aneurysm. POSTERIOR CIRCULATION: LEFT vertebral artery is dominant. Basilar artery is patent, with normal flow related enhancement of the main branch vessels. Mild luminal irregularity bilateral vertebral arteries compatible with atherosclerosis. Normal flow related enhancement of the posterior cerebral arteries. No large vessel occlusion, high-grade stenosis, abnormal luminal irregularity, aneurysm. ANATOMIC VARIANTS: None. MRA NECK FINDINGS- limited noncontrast, moderately motion degraded examination. ANTERIOR CIRCULATION: The common carotid arteries are widely patent bilaterally origins not imaged due to time-of-flight technique. The carotid bifurcation is patent bilaterally and there is no hemodynamically significant carotid stenosis by NASCET criteria. No evidence for atherosclerosis or flow limiting stenosis of the cervical internal carotid arteries. POSTERIOR CIRCULATION:  Bilateral vertebral arteries are patent to the vertebrobasilar junction, origins not imaged. No evidence for atherosclerosis or flow limiting stenosis. Source images and MIP image were reviewed. IMPRESSION: MRI HEAD: Mildly motion degraded examination. Multifocal acute on subacute RIGHT MCA territory infarcts including RIGHT basal ganglia. Mild to moderate white matter changes compatible chronic small vessel ischemic disease. MRA HEAD: Moderately motion degraded examination. No emergent large vessel occlusion. Severe stenosis RIGHT M2 origin. Moderate stenosis RIGHT M1 and LEFT M2 origin. MRA NECK: Negative noncontrast moderately motion degraded examination. Electronically Signed   By: Awilda Metro M.D.   On: 06/21/2016 21:18   Mr Brain Wo Contrast  Result Date: 06/21/2016 CLINICAL DATA:  LEFT facial droop and slurred speech beginning this morning. LEFT hand weakness. Follow-up code stroke. History of end-stage on dialysis, hypertension, hyperlipidemia and diabetes. EXAM: MR HEAD WITHOUT CONTRAST MR CIRCLE OF WILLIS WITHOUT CONTRAST MRA OF THE NECK WITHOUT CONTRAST TECHNIQUE: Multiplanar, multiecho pulse sequences of the brain, circle of Willis and surrounding structures were obtained without intravenous contrast. Angiographic images of the neck were obtained using MRA technique without intravenous contrast. COMPARISON:  CT HEAD June 21, 2016 at 1141 hours FINDINGS: MR HEAD FINDINGS- multiple sequences are mildly motion degraded. BRAIN: Subcentimeter focus of reduced diffusion RIGHT frontal and parietal lobes with low ADC values. Patchy reduced diffusion RIGHT basal ganglia and insula with normalizing ADC values in intermediate to bright FLAIR T2 hyperintense signal. No susceptibility artifact to suggest hemorrhage. The ventricles and sulci are normal for patient's age. Greater than expected scattered subcentimeter supratentorial white matter FLAIR T2 hyperintensities, exclusive of the aforementioned  abnormality. No mass effect. No abnormal extra-axial fluid collections. VASCULAR: Normal major intracranial vascular flow voids  present at skull base. SKULL AND UPPER CERVICAL SPINE: No abnormal sellar expansion. No suspicious calvarial bone marrow signal. Craniocervical junction maintained. SINUSES/ORBITS: Mild paranasal sinus mucosal thickening. Mastoid air cells are well aerated. The included ocular globes and orbital contents are non-suspicious. OTHER: None. MR CIRCLE OF WILLIS FINDINGS- moderately motion degraded examination . ANTERIOR CIRCULATION: Normal flow related enhancement of the included cervical, petrous, cavernous and supraclinoid internal carotid arteries. Patent anterior communicating artery. Flow related enhancement of the anterior and middle cerebral arteries, including distal segments. Moderate stenosis distal RIGHT M1 and severe RIGHT M2 origins. Moderate stenosis proximal LEFT M2 origin, superior division. No large vessel occlusion, abnormal luminal irregularity, aneurysm. POSTERIOR CIRCULATION: LEFT vertebral artery is dominant. Basilar artery is patent, with normal flow related enhancement of the main branch vessels. Mild luminal irregularity bilateral vertebral arteries compatible with atherosclerosis. Normal flow related enhancement of the posterior cerebral arteries. No large vessel occlusion, high-grade stenosis, abnormal luminal irregularity, aneurysm. ANATOMIC VARIANTS: None. MRA NECK FINDINGS- limited noncontrast, moderately motion degraded examination. ANTERIOR CIRCULATION: The common carotid arteries are widely patent bilaterally origins not imaged due to time-of-flight technique. The carotid bifurcation is patent bilaterally and there is no hemodynamically significant carotid stenosis by NASCET criteria. No evidence for atherosclerosis or flow limiting stenosis of the cervical internal carotid arteries. POSTERIOR CIRCULATION: Bilateral vertebral arteries are patent to the  vertebrobasilar junction, origins not imaged. No evidence for atherosclerosis or flow limiting stenosis. Source images and MIP image were reviewed. IMPRESSION: MRI HEAD: Mildly motion degraded examination. Multifocal acute on subacute RIGHT MCA territory infarcts including RIGHT basal ganglia. Mild to moderate white matter changes compatible chronic small vessel ischemic disease. MRA HEAD: Moderately motion degraded examination. No emergent large vessel occlusion. Severe stenosis RIGHT M2 origin. Moderate stenosis RIGHT M1 and LEFT M2 origin. MRA NECK: Negative noncontrast moderately motion degraded examination. Electronically Signed   By: Awilda Metro M.D.   On: 06/21/2016 21:18   Ct Head Code Stroke W/o Cm  Result Date: 06/21/2016 CLINICAL DATA:  Code stroke. Code stroke. Left-sided weakness and slurred speech. Facial droop. EXAM: CT HEAD WITHOUT CONTRAST TECHNIQUE: Contiguous axial images were obtained from the base of the skull through the vertex without intravenous contrast. COMPARISON:  None. FINDINGS: Brain: There is subtle loss of gray-white differentiation along the posterior right insula and in the right lentiform nucleus. Fall this could be artifact, early ischemia is certainly considered. Acute hemorrhage or mass lesion is present. Cortex over the convexities is within normal limits. The ventricles are of normal size. No significant extra-axial fluid collection is present. Vascular: Atherosclerotic calcifications are present within the cavernous internal carotid arteries. There is no hyperdense vessel. A calcification is evident near the right MCA bifurcation. There is no hyperdense vessel. Skull: The calvarium is within normal limits. Sinuses/Orbits: Chronic bilateral maxillary sinus disease is present. There is soft tissue opacification of the ostiomeatal complex bilaterally. This may represent polyp point disease. The globes and orbits are within normal limits. ASPECTS [facility] Stroke Program  Early CT Score) - Ganglionic level infarction (caudate, lentiform nuclei, internal capsule, insula, M1-M3 cortex): 5/7. Points lost for the posterior right insula and lentiform nucleus. - Supraganglionic infarction (M4-M6 cortex): 3/3 Total score (0-10 with 10 being normal): 8/10 IMPRESSION: 1. Subtle loss of gray-white differentiation in the posterior right insula and lentiform nucleus suggesting acute ischemia. 2. Chronic bilateral maxillary sinus disease 3. ASPECTS is 8/10 Electronically Signed   By: Marin Roberts M.D.   On: 06/21/2016 11:58  Assessment/Plan 1. End stage renal disease (HCC) Recommend:  At this time the patient does not have appropriate extremity access for dialysis  Patient should have a left arm fistula created.  I will evaluate the wrist location but have discussed the the vein is better at the upper arm level and this is likely were the fistula will be created.  The risks, benefits and alternative therapies were reviewed in detail with the patient.  All questions were answered.  The patient agrees to proceed with surgery.    2. Complication of renal dialysis, sequela I will plan removal of the PD catheter at the time of fistula creation.  The risks, benefits and alternative therapies were reviewed in detail with the patient.  All questions were answered.  The patient agrees to proceed with surgery.   3. Essential hypertension Continue antihypertensive medications as already ordered, these medications have been reviewed and there are no changes at this time.   4. Mixed hyperlipidemia Continue statin as ordered and reviewed, no changes at this time   5. Type 2 diabetes mellitus with stage 5 chronic kidney disease not on chronic dialysis, without long-term current use of insulin (HCC) Continue hypoglycemic medications as already ordered, these medications have been reviewed and there are no changes at this time.  Hgb A1C to be monitored as already arranged  by primary service   Levora Dredge, MD  07/18/2016 8:39 AM

## 2016-07-21 ENCOUNTER — Telehealth: Payer: Self-pay | Admitting: Cardiovascular Disease

## 2016-07-21 ENCOUNTER — Ambulatory Visit (HOSPITAL_COMMUNITY)
Admission: RE | Admit: 2016-07-21 | Discharge: 2016-07-21 | Disposition: A | Discharge status: Home / Self Care | Payer: Medicare Other | Source: Ambulatory Visit | Attending: Physician Assistant | Admitting: Physician Assistant

## 2016-07-21 ENCOUNTER — Encounter
Admission: RE | Admit: 2016-07-21 | Discharge: 2016-07-21 | Disposition: A | Discharge status: Home / Self Care | Payer: Medicare Other | Source: Ambulatory Visit | Attending: Vascular Surgery | Admitting: Vascular Surgery

## 2016-07-21 ENCOUNTER — Inpatient Hospital Stay: Admission: RE | Admit: 2016-07-21 | Payer: Self-pay | Source: Ambulatory Visit

## 2016-07-21 DIAGNOSIS — I10 Essential (primary) hypertension: Secondary | ICD-10-CM | POA: Insufficient documentation

## 2016-07-21 DIAGNOSIS — N186 End stage renal disease: Secondary | ICD-10-CM | POA: Diagnosis not present

## 2016-07-21 DIAGNOSIS — Z01818 Encounter for other preprocedural examination: Secondary | ICD-10-CM | POA: Diagnosis present

## 2016-07-21 DIAGNOSIS — I5189 Other ill-defined heart diseases: Secondary | ICD-10-CM | POA: Insufficient documentation

## 2016-07-21 HISTORY — DX: Dependence on renal dialysis: Z99.2

## 2016-07-21 HISTORY — DX: Dyspnea, unspecified: R06.00

## 2016-07-21 HISTORY — DX: Chronic obstructive pulmonary disease, unspecified: J44.9

## 2016-07-21 HISTORY — DX: Umbilical hernia without obstruction or gangrene: K42.9

## 2016-07-21 LAB — APTT: APTT: 33 s (ref 24–36)

## 2016-07-21 LAB — CBC WITH DIFFERENTIAL/PLATELET
BASOS ABS: 0.1 10*3/uL (ref 0–0.1)
BASOS PCT: 1 %
Eosinophils Absolute: 0.7 10*3/uL (ref 0–0.7)
Eosinophils Relative: 7 %
HEMATOCRIT: 34.4 % — AB (ref 40.0–52.0)
HEMOGLOBIN: 11.5 g/dL — AB (ref 13.0–18.0)
Lymphocytes Relative: 13 %
Lymphs Abs: 1.3 10*3/uL (ref 1.0–3.6)
MCH: 29.8 pg (ref 26.0–34.0)
MCHC: 33.3 g/dL (ref 32.0–36.0)
MCV: 89.4 fL (ref 80.0–100.0)
Monocytes Absolute: 0.7 10*3/uL (ref 0.2–1.0)
Monocytes Relative: 7 %
NEUTROS PCT: 72 %
Neutro Abs: 7.5 10*3/uL — ABNORMAL HIGH (ref 1.4–6.5)
Platelets: 328 10*3/uL (ref 150–440)
RBC: 3.85 MIL/uL — AB (ref 4.40–5.90)
RDW: 17.8 % — ABNORMAL HIGH (ref 11.5–14.5)
WBC: 10.2 10*3/uL (ref 3.8–10.6)

## 2016-07-21 LAB — BASIC METABOLIC PANEL
ANION GAP: 11 (ref 5–15)
BUN: 26 mg/dL — ABNORMAL HIGH (ref 6–20)
CALCIUM: 9.7 mg/dL (ref 8.9–10.3)
CO2: 27 mmol/L (ref 22–32)
Chloride: 93 mmol/L — ABNORMAL LOW (ref 101–111)
Creatinine, Ser: 6.86 mg/dL — ABNORMAL HIGH (ref 0.61–1.24)
GFR, EST AFRICAN AMERICAN: 9 mL/min — AB (ref >60)
GFR, EST NON AFRICAN AMERICAN: 8 mL/min — AB (ref >60)
Glucose, Bld: 87 mg/dL (ref 65–99)
Potassium: 4 mmol/L (ref 3.5–5.1)
Sodium: 131 mmol/L — ABNORMAL LOW (ref 135–145)

## 2016-07-21 LAB — SURGICAL PCR SCREEN
MRSA, PCR: NEGATIVE
Staphylococcus aureus: NEGATIVE

## 2016-07-21 LAB — TYPE AND SCREEN
ABO/RH(D): A POS
Antibody Screen: NEGATIVE

## 2016-07-21 LAB — PROTIME-INR
INR: 0.92
Prothrombin Time: 12.4 seconds (ref 11.4–15.2)

## 2016-07-21 NOTE — Patient Instructions (Signed)
Your procedure is scheduled on: July 28, 2016 (FRIDAY) Report to Same Day Surgery 2nd floor medical mall (Medical Mall Entrance-take elevator on left to 2nd floor.  Check in with surgery information desk.) To find out your arrival time please call [phone] between 1PM - 3PM on July 27, 2016 (THURSDAY)  Remember: Instructions that are not followed completely may result in serious medical risk, up to and including death, or upon the discretion of your surgeon and anesthesiologist your surgery may need to be rescheduled.    _x___ 1. Do not eat food or drink liquids after midnight. No gum chewing or                              hard candies.     __x__ 2. No Alcohol for 24 hours before or after surgery.   __x__3. No Smoking for 24 prior to surgery.   ____  4. Bring all medications with you on the day of surgery if instructed.    __x__ 5. Notify your doctor if there is any change in your medical condition     (cold, fever, infections).     Do not wear jewelry, make-up, hairpins, clips or nail polish.  Do not wear lotions, powders, or perfumes. You may wear deodorant.  Do not shave 48 hours prior to surgery. Men may shave face and neck.  Do not bring valuables to the hospital.    [facility] is not responsible for any belongings or valuables.               Contacts, dentures or bridgework may not be worn into surgery.  Leave your suitcase in the car. After surgery it may be brought to your room.  For patients admitted to the hospital, discharge time is determined by your                       treatment team.   Patients discharged the day of surgery will not be allowed to drive home.  You will need someone to drive you home and stay with you the night of your procedure.    Please read over the following fact sheets that you were given:   [facility] Preparing for Surgery and or MRSA Information   _x___ Take anti-hypertensive (unless it includes a diuretic), cardiac, seizure, asthma,      anti-reflux and psychiatric medicines. These include:  1. COREG  2.  3.  4.  5.  6.  ____Fleets enema or Magnesium Citrate as directed.   _x___ Use CHG Soap or sage wipes as directed on instruction sheet   _x___ Use inhalers on the day of surgery and bring to hospital day of surgery (USE QVAR AND SPIRIVA INHALERS THE MORNING OF SURGERY AND BRING QVAR INHALER WITH YOU TO HOSPITAL THE DAY OF SURGERY)  ____ Stop Metformin and Janumet 2 days prior to surgery.    ____ Take 1/2 of usual insulin dose the night before surgery and none on the morning     surgery.   _x___ Follow recommendations from Cardiologist, Pulmonologist or PCP regarding          stopping Aspirin, Coumadin, Pllavix ,Eliquis, Effient, or Pradaxa, and Pletal. (CONTINUE ASPIRIN AND PLAVIX)  X____Stop Anti-inflammatories such as Advil, Aleve, Ibuprofen, Motrin, Naproxen, Naprosyn, Goodies powders or aspirin products. OK to take Tylenol    _x___ Stop supplements until after surgery.  But may continue Vitamin  D, Vitamin B, and multivitamin.       ____ Bring C-Pap to the hospital.

## 2016-07-21 NOTE — Telephone Encounter (Signed)
Received cardiac clearance request for pt to proceed w/ left upper extremity fistula w/ Dr. Gilda Crease on 07/28/16.   Pt previously saw Eula Listen, PA on 07/10/16 for hosp f/u; he is currently wearing a cardiac monitor. Pt sched to est care w/ Dr. Mariah Milling on 08/07/16. Please review and route clearance to [facility] @ [phone], Attn: Vernona Rieger.

## 2016-07-21 NOTE — Progress Notes (Signed)
Pt here for CT heart, has been to [place] this am for pre-surgery blood work for Fistula next week. Pt states RN coudlnt get blood from [facility]; which will be needong here for IV access. Pt asks if IV team can come and stick him, so he doesn't have to be stuck several times. States he is a "hard" stick, and also has limited access for IV placement.

## 2016-07-21 NOTE — Pre-Procedure Instructions (Signed)
Cardiac clearance request called and faxed to Dr. Gilda Crease office (spoke to [place]).

## 2016-07-21 NOTE — Pre-Procedure Instructions (Signed)
Dr. Pernell Dupre notified of recent CVA (07/18/16) , medical history and patient wearing a cardiac monitor. Also informed that patient will be wearing monitor until 08/07/16 and surgery scheduled for 07/28/16. Cardiac clearance requested.

## 2016-07-21 NOTE — Pre-Procedure Instructions (Signed)
Cardiac clearance request faxed and called to Dr. Mariah Milling office this morning.

## 2016-07-24 NOTE — Telephone Encounter (Signed)
PREOPERATIVE CARDIAC RISK ASSESSMENT:   Revised Cardiac Risk Index:  High Risk Surgery (defined as Intraperitoneal, intrathoracic or suprainguinal vascular): Yes; (vascular)  Active CAD: No  CHF: Yes  Cerebrovascular Disease: Yes   Diabetes: Yes; On Insulin: No  CKD (Cr >~ 2): Yes   Estimated Risk of Adverse Outcome: High risk for high risk surgery. Estimated Risk of MI, PE, VF/VT (Cardiac Arrest), Complete Heart Block: 11%   ACC/AHA Guidelines for "Clearance":  Step 1 - Need for Emergency Surgery: No   If Yes - go straight to OR with perioperative surveillance  Step 2 - Active Cardiac Conditions (Unstable Angina, Decompensated HF, Significant  Arrhytmias - Complete HB, Mobitz II, Symptomatic VT or SVT, Severe Aortic Stenosis - mean gradient > 40 mmHg, Valve area < 1.0 cm2): No   If Yes - Evaluate & Treat per ACC/AHA Guidelines  Step 3 -  Low Risk Surgery: No  If Yes --> proceed to OR  If No --> Step 4  Step 4 - Functional Capacity >= 4 METS without symptoms: Yes  If Yes --> proceed to OR  If No --> Step 5  Step 5 --  Clinical Risk Factors (CRF)  - Zero --> proceed to OR     Patient with recent hospital admission to [facility] for acute stroke. Placed on dual antiplatelet therapy at that time with aspirin and Plavix. Echocardiogram during that admission showed a newly reduced ejection fraction of 25%. He underwent right and left heart catheterization heavily calcified coronary arteries with chronic occlusion of OM1, moderate diffuse RCA stenosis, and mild nonobstructive LAD stenosis. Normal right heart hemodynamics with normal LVEDP. Continued medical therapy was advised. 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 TEE did show a walled off region next to the aorta that was felt to possibly represent a remote abscess. There was further recommendation to evaluate this via cardiac CT as an outpatient, results are pending at this time. Patient  was doing reasonably well at his hospital follow-up on 07/10/2016 with recommendation to manage his volume via hemodialysis.  Patient would be high risk for planned vascular surgery given the above. Will defer management of his dual antiplatelet therapy in the perioperative time frame to PCP and/or neurology given he is on these medications for noncardiac reasons as above.

## 2016-07-24 NOTE — Telephone Encounter (Signed)
Routed to fax # provided. 

## 2016-07-25 NOTE — Pre-Procedure Instructions (Signed)
Cleared high risk by Eula Listen [facility] 07/24/16 AND DR PISCITELLO NOTIFIED.

## 2016-07-27 MED ORDER — FAMOTIDINE 20 MG PO TABS
20.0000 mg | ORAL_TABLET | Freq: Once | ORAL | Status: AC
  Administered 2016-07-28: 20 mg via ORAL

## 2016-07-27 MED ORDER — CEFAZOLIN SODIUM-DEXTROSE 2-4 GM/100ML-% IV SOLN
2.0000 g | INTRAVENOUS | Status: AC
  Administered 2016-07-28: 2 g via INTRAVENOUS

## 2016-07-28 ENCOUNTER — Ambulatory Visit: Payer: Medicare Other | Admitting: Anesthesiology

## 2016-07-28 ENCOUNTER — Encounter
Admission: RE | Disposition: A | Discharge status: Home / Self Care | Payer: Self-pay | Source: Ambulatory Visit | Attending: Vascular Surgery

## 2016-07-28 ENCOUNTER — Encounter: Payer: Self-pay | Admitting: *Deleted

## 2016-07-28 ENCOUNTER — Ambulatory Visit
Admission: RE | Admit: 2016-07-28 | Discharge: 2016-07-28 | Disposition: A | Discharge status: Home / Self Care | Payer: Medicare Other | Source: Ambulatory Visit | Attending: Vascular Surgery | Admitting: Vascular Surgery

## 2016-07-28 DIAGNOSIS — I2510 Atherosclerotic heart disease of native coronary artery without angina pectoris: Secondary | ICD-10-CM | POA: Insufficient documentation

## 2016-07-28 DIAGNOSIS — Z7982 Long term (current) use of aspirin: Secondary | ICD-10-CM | POA: Insufficient documentation

## 2016-07-28 DIAGNOSIS — Z79899 Other long term (current) drug therapy: Secondary | ICD-10-CM | POA: Diagnosis not present

## 2016-07-28 DIAGNOSIS — E1122 Type 2 diabetes mellitus with diabetic chronic kidney disease: Secondary | ICD-10-CM | POA: Diagnosis not present

## 2016-07-28 DIAGNOSIS — Z7951 Long term (current) use of inhaled steroids: Secondary | ICD-10-CM | POA: Diagnosis not present

## 2016-07-28 DIAGNOSIS — F17210 Nicotine dependence, cigarettes, uncomplicated: Secondary | ICD-10-CM | POA: Diagnosis not present

## 2016-07-28 DIAGNOSIS — E1151 Type 2 diabetes mellitus with diabetic peripheral angiopathy without gangrene: Secondary | ICD-10-CM | POA: Insufficient documentation

## 2016-07-28 DIAGNOSIS — I509 Heart failure, unspecified: Secondary | ICD-10-CM | POA: Diagnosis not present

## 2016-07-28 DIAGNOSIS — N186 End stage renal disease: Secondary | ICD-10-CM | POA: Insufficient documentation

## 2016-07-28 DIAGNOSIS — J449 Chronic obstructive pulmonary disease, unspecified: Secondary | ICD-10-CM | POA: Diagnosis not present

## 2016-07-28 DIAGNOSIS — E782 Mixed hyperlipidemia: Secondary | ICD-10-CM | POA: Insufficient documentation

## 2016-07-28 DIAGNOSIS — Z8673 Personal history of transient ischemic attack (TIA), and cerebral infarction without residual deficits: Secondary | ICD-10-CM | POA: Diagnosis not present

## 2016-07-28 DIAGNOSIS — Z992 Dependence on renal dialysis: Secondary | ICD-10-CM | POA: Diagnosis not present

## 2016-07-28 DIAGNOSIS — Z7902 Long term (current) use of antithrombotics/antiplatelets: Secondary | ICD-10-CM | POA: Diagnosis not present

## 2016-07-28 DIAGNOSIS — T829XXS Unspecified complication of cardiac and vascular prosthetic device, implant and graft, sequela: Secondary | ICD-10-CM

## 2016-07-28 DIAGNOSIS — I132 Hypertensive heart and chronic kidney disease with heart failure and with stage 5 chronic kidney disease, or end stage renal disease: Secondary | ICD-10-CM | POA: Diagnosis not present

## 2016-07-28 HISTORY — PX: AV FISTULA PLACEMENT: SHX1204

## 2016-07-28 LAB — POCT I-STAT 4, (NA,K, GLUC, HGB,HCT)
GLUCOSE: 96 mg/dL (ref 65–99)
HEMATOCRIT: 35 % — AB (ref 39.0–52.0)
Hemoglobin: 11.9 g/dL — ABNORMAL LOW (ref 13.0–17.0)
Potassium: 3.7 mmol/L (ref 3.5–5.1)
SODIUM: 131 mmol/L — AB (ref 135–145)

## 2016-07-28 LAB — GLUCOSE, CAPILLARY
GLUCOSE-CAPILLARY: 79 mg/dL (ref 65–99)
Glucose-Capillary: 97 mg/dL (ref 65–99)

## 2016-07-28 LAB — ABO/RH: ABO/RH(D): A POS

## 2016-07-28 SURGERY — ARTERIOVENOUS (AV) FISTULA CREATION
Anesthesia: Monitor Anesthesia Care | Laterality: Left | Wound class: Clean

## 2016-07-28 MED ORDER — LIDOCAINE HCL (CARDIAC) 20 MG/ML IV SOLN
INTRAVENOUS | Status: DC | PRN
  Administered 2016-07-28 (×2): 60 mg via INTRAVENOUS
  Administered 2016-07-28: 80 mg via INTRAVENOUS

## 2016-07-28 MED ORDER — DEXAMETHASONE SODIUM PHOSPHATE 10 MG/ML IJ SOLN
INTRAMUSCULAR | Status: AC
  Filled 2016-07-28: qty 1

## 2016-07-28 MED ORDER — FENTANYL CITRATE (PF) 100 MCG/2ML IJ SOLN
INTRAMUSCULAR | Status: AC
  Filled 2016-07-28: qty 2

## 2016-07-28 MED ORDER — LIDOCAINE HCL (PF) 1 % IJ SOLN
INTRAMUSCULAR | Status: AC
  Filled 2016-07-28: qty 5

## 2016-07-28 MED ORDER — CHLORHEXIDINE GLUCONATE CLOTH 2 % EX PADS: 6.0000 | MEDICATED_PAD | Freq: Once | CUTANEOUS | Status: DC

## 2016-07-28 MED ORDER — FENTANYL CITRATE (PF) 100 MCG/2ML IJ SOLN
50.0000 ug | Freq: Once | INTRAMUSCULAR | Status: AC
  Administered 2016-07-28: 50 ug via INTRAVENOUS

## 2016-07-28 MED ORDER — PROPOFOL 500 MG/50ML IV EMUL
INTRAVENOUS | Status: DC | PRN
  Administered 2016-07-28: 100 ug/kg/min via INTRAVENOUS

## 2016-07-28 MED ORDER — SODIUM CHLORIDE 0.9 % IV SOLN
INTRAVENOUS | Status: DC | PRN
  Administered 2016-07-28: 50 ug/min via INTRAVENOUS

## 2016-07-28 MED ORDER — ROCURONIUM BROMIDE 50 MG/5ML IV SOLN
INTRAVENOUS | Status: AC
  Filled 2016-07-28: qty 1

## 2016-07-28 MED ORDER — SODIUM CHLORIDE 0.9 % IV SOLN
INTRAVENOUS | Status: DC | PRN
  Administered 2016-07-28: 10 mL via INTRAMUSCULAR

## 2016-07-28 MED ORDER — HEPARIN SODIUM (PORCINE) 5000 UNIT/ML IJ SOLN
INTRAMUSCULAR | Status: AC
  Filled 2016-07-28: qty 1

## 2016-07-28 MED ORDER — PROPOFOL 10 MG/ML IV BOLUS
INTRAVENOUS | Status: AC
  Filled 2016-07-28: qty 20

## 2016-07-28 MED ORDER — PROPOFOL 500 MG/50ML IV EMUL
INTRAVENOUS | Status: AC
  Filled 2016-07-28: qty 50

## 2016-07-28 MED ORDER — MIDAZOLAM HCL 2 MG/2ML IJ SOLN
INTRAMUSCULAR | Status: DC | PRN
  Administered 2016-07-28: 2 mg via INTRAVENOUS

## 2016-07-28 MED ORDER — EPHEDRINE SULFATE 50 MG/ML IJ SOLN
INTRAMUSCULAR | Status: AC
  Filled 2016-07-28: qty 1

## 2016-07-28 MED ORDER — OXYCODONE HCL 5 MG/5ML PO SOLN: 5.0000 mg | Freq: Once | ORAL | Status: DC | PRN

## 2016-07-28 MED ORDER — ROPIVACAINE HCL 5 MG/ML IJ SOLN
INTRAMUSCULAR | Status: AC
  Filled 2016-07-28: qty 40

## 2016-07-28 MED ORDER — ONDANSETRON HCL 4 MG/2ML IJ SOLN
INTRAMUSCULAR | Status: AC
  Filled 2016-07-28: qty 2

## 2016-07-28 MED ORDER — SUCCINYLCHOLINE CHLORIDE 20 MG/ML IJ SOLN
INTRAMUSCULAR | Status: AC
  Filled 2016-07-28: qty 1

## 2016-07-28 MED ORDER — ROPIVACAINE HCL 5 MG/ML IJ SOLN
INTRAMUSCULAR | Status: DC | PRN
  Administered 2016-07-28: 7 mL via PERINEURAL
  Administered 2016-07-28: 6 mL via PERINEURAL
  Administered 2016-07-28: 7 mL via EPIDURAL

## 2016-07-28 MED ORDER — FENTANYL CITRATE (PF) 100 MCG/2ML IJ SOLN: 25.0000 ug | INTRAMUSCULAR | Status: DC | PRN

## 2016-07-28 MED ORDER — HYDROCODONE-ACETAMINOPHEN 5-325 MG PO TABS: 1.0000 | ORAL_TABLET | Freq: Four times a day (QID) | ORAL | 0 refills | Status: DC | PRN

## 2016-07-28 MED ORDER — PHENYLEPHRINE HCL 10 MG/ML IJ SOLN
INTRAMUSCULAR | Status: AC
  Filled 2016-07-28: qty 1

## 2016-07-28 MED ORDER — MIDAZOLAM HCL 2 MG/2ML IJ SOLN
1.0000 mg | Freq: Once | INTRAMUSCULAR | Status: AC
  Administered 2016-07-28: 1 mg via INTRAVENOUS

## 2016-07-28 MED ORDER — BUPIVACAINE HCL (PF) 0.5 % IJ SOLN
INTRAMUSCULAR | Status: AC
  Filled 2016-07-28: qty 30

## 2016-07-28 MED ORDER — MIDAZOLAM HCL 2 MG/2ML IJ SOLN
INTRAMUSCULAR | Status: AC
  Administered 2016-07-28: 1 mg via INTRAVENOUS
  Filled 2016-07-28: qty 2

## 2016-07-28 MED ORDER — OXYCODONE HCL 5 MG PO TABS: 5.0000 mg | ORAL_TABLET | Freq: Once | ORAL | Status: DC | PRN

## 2016-07-28 MED ORDER — FENTANYL CITRATE (PF) 100 MCG/2ML IJ SOLN
INTRAMUSCULAR | Status: AC
  Administered 2016-07-28: 50 ug via INTRAVENOUS
  Filled 2016-07-28: qty 2

## 2016-07-28 MED ORDER — SODIUM CHLORIDE 0.9 % IJ SOLN
INTRAMUSCULAR | Status: AC
  Filled 2016-07-28: qty 10

## 2016-07-28 MED ORDER — MIDAZOLAM HCL 2 MG/2ML IJ SOLN
INTRAMUSCULAR | Status: AC
  Filled 2016-07-28: qty 2

## 2016-07-28 MED ORDER — SODIUM CHLORIDE 0.9 % IV SOLN
INTRAVENOUS | Status: DC
  Administered 2016-07-28 (×2): via INTRAVENOUS

## 2016-07-28 MED ORDER — FAMOTIDINE 20 MG PO TABS
ORAL_TABLET | ORAL | Status: AC
  Filled 2016-07-28: qty 1

## 2016-07-28 MED ORDER — CEFAZOLIN SODIUM-DEXTROSE 2-4 GM/100ML-% IV SOLN
INTRAVENOUS | Status: AC
  Filled 2016-07-28: qty 100

## 2016-07-28 MED ORDER — BUPIVACAINE HCL (PF) 0.5 % IJ SOLN
INTRAMUSCULAR | Status: DC | PRN
  Administered 2016-07-28: 8 mL

## 2016-07-28 SURGICAL SUPPLY — 56 items
APPLIER CLIP 11 MED OPEN (CLIP)
APPLIER CLIP 9.375 SM OPEN (CLIP)
BAG DECANTER FOR FLEXI CONT (MISCELLANEOUS) ×3 IMPLANT
BLADE SURG SZ11 CARB STEEL (BLADE) ×3 IMPLANT
BOOT SUTURE AID YELLOW STND (SUTURE) ×3 IMPLANT
BRUSH SCRUB 4% CHG (MISCELLANEOUS) ×3 IMPLANT
CANISTER SUCT 1200ML W/VALVE (MISCELLANEOUS) ×3 IMPLANT
CHLORAPREP W/TINT 26ML (MISCELLANEOUS) ×3 IMPLANT
CLIP APPLIE 11 MED OPEN (CLIP) IMPLANT
CLIP APPLIE 9.375 SM OPEN (CLIP) IMPLANT
DERMABOND ADVANCED (GAUZE/BANDAGES/DRESSINGS) ×2
DERMABOND ADVANCED .7 DNX12 (GAUZE/BANDAGES/DRESSINGS) ×1 IMPLANT
DRESSING SURGICEL FIBRLLR 1X2 (HEMOSTASIS) ×1 IMPLANT
DRSG SURGICEL FIBRILLAR 1X2 (HEMOSTASIS) ×3
ELECT CAUTERY BLADE 6.4 (BLADE) ×3 IMPLANT
ELECT REM PT RETURN 9FT ADLT (ELECTROSURGICAL) ×3
ELECTRODE REM PT RTRN 9FT ADLT (ELECTROSURGICAL) ×1 IMPLANT
GEL ULTRASOUND 20GR AQUASONIC (MISCELLANEOUS) IMPLANT
GLOVE BIO SURGEON STRL SZ7 (GLOVE) ×9 IMPLANT
GLOVE INDICATOR 7.5 STRL GRN (GLOVE) ×6 IMPLANT
GLOVE SURG SYN 8.0 (GLOVE) ×3 IMPLANT
GOWN STRL REUS W/ TWL LRG LVL3 (GOWN DISPOSABLE) ×1 IMPLANT
GOWN STRL REUS W/ TWL XL LVL3 (GOWN DISPOSABLE) ×1 IMPLANT
GOWN STRL REUS W/TWL LRG LVL3 (GOWN DISPOSABLE) ×2
GOWN STRL REUS W/TWL XL LVL3 (GOWN DISPOSABLE) ×2
IV NS 500ML (IV SOLUTION) ×2
IV NS 500ML BAXH (IV SOLUTION) ×1 IMPLANT
KIT RM TURNOVER STRD PROC AR (KITS) ×3 IMPLANT
LABEL OR SOLS (LABEL) ×3 IMPLANT
LOOP RED MAXI  1X406MM (MISCELLANEOUS) ×2
LOOP VESSEL MAXI 1X406 RED (MISCELLANEOUS) ×1 IMPLANT
LOOP VESSEL MINI 0.8X406 BLUE (MISCELLANEOUS) ×1 IMPLANT
LOOPS BLUE MINI 0.8X406MM (MISCELLANEOUS) ×2
NEEDLE FILTER BLUNT 18X 1/2SAF (NEEDLE) ×2
NEEDLE FILTER BLUNT 18X1 1/2 (NEEDLE) ×1 IMPLANT
NEEDLE HYPO 30X.5 LL (NEEDLE) IMPLANT
NS IRRIG 500ML POUR BTL (IV SOLUTION) IMPLANT
PACK EXTREMITY ARMC (MISCELLANEOUS) ×3 IMPLANT
PAD PREP 24X41 OB/GYN DISP (PERSONAL CARE ITEMS) ×3 IMPLANT
PUNCH SURGICAL ROTATE 2.7MM (MISCELLANEOUS) IMPLANT
STOCKINETTE STRL 4IN 9604848 (GAUZE/BANDAGES/DRESSINGS) ×3 IMPLANT
STOCKINETTE STRL 6IN 960660 (GAUZE/BANDAGES/DRESSINGS) ×3 IMPLANT
SUT MNCRL+ 5-0 UNDYED PC-3 (SUTURE) ×1 IMPLANT
SUT MONOCRYL 5-0 (SUTURE) ×2
SUT PROLENE 6 0 BV (SUTURE) ×12 IMPLANT
SUT SILK 2 0 (SUTURE) ×2
SUT SILK 2-0 18XBRD TIE 12 (SUTURE) ×1 IMPLANT
SUT SILK 3 0 (SUTURE) ×2
SUT SILK 3-0 18XBRD TIE 12 (SUTURE) ×1 IMPLANT
SUT SILK 4 0 (SUTURE) ×2
SUT SILK 4-0 18XBRD TIE 12 (SUTURE) ×1 IMPLANT
SUT VIC AB 3-0 SH 27 (SUTURE) ×2
SUT VIC AB 3-0 SH 27X BRD (SUTURE) ×1 IMPLANT
SYR 20CC LL (SYRINGE) ×3 IMPLANT
SYR 3ML LL SCALE MARK (SYRINGE) ×3 IMPLANT
TOWEL OR 17X26 4PK STRL BLUE (TOWEL DISPOSABLE) IMPLANT

## 2016-07-28 NOTE — Progress Notes (Signed)
Wife and patient report to Dr. Randa Ngo he has been Wearing a heart monitor at home, did not wear to Hospital today.  He is supposed to wear this for 30 days.

## 2016-07-28 NOTE — Transfer of Care (Signed)
Immediate Anesthesia Transfer of Care Note  Patient: Justin Fitzgerald  Procedure(s) Performed: Procedure(s): ARTERIOVENOUS (AV) FISTULA CREATION (Left)  Patient Location: PACU  Anesthesia Type:General  Level of Consciousness: awake, alert  and oriented  Airway & Oxygen Therapy: Patient Spontanous Breathing and Patient connected to face mask oxygen  Post-op Assessment: Report given to RN and Post -op Vital signs reviewed and stable  Post vital signs: Reviewed and stable  Last Vitals:  Vitals:   07/28/16 0739 07/28/16 0744  BP: 105/67 105/75  Pulse: 84 84  Resp: 18 19  Temp:      Last Pain:  Vitals:   07/28/16 0611  TempSrc: Tympanic         Complications: No apparent anesthesia complications

## 2016-07-28 NOTE — OR Nursing (Signed)
Left arm thrill is very weak near the fistula but a good bruit is heard with a stethoscope. Pt. And his wife were shown how to listen for a bruit and they report they will check daily. Dr. Randa Ngo into see patient and a sling was applied to the left arm until patient has control of arm after block wears out.

## 2016-07-28 NOTE — H&P (Signed)
[facility] History & Physical Update  The patient was interviewed and re-examined.  The patient's previous History and Physical has been reviewed and is unchanged.  There is no change in the plan of care. We plan to proceed with the scheduled procedure.  Levora Dredge, MD  07/28/2016, 7:29 AM

## 2016-07-28 NOTE — Anesthesia Preprocedure Evaluation (Signed)
Anesthesia Evaluation  Patient identified by MRN, date of birth, ID band Patient awake    Reviewed: Allergy & Precautions, H&P , NPO status , Patient's Chart, lab work & pertinent test results  History of Anesthesia Complications Negative for: history of anesthetic complications  Airway Mallampati: III  TM Distance: >3 FB Neck ROM: full    Dental  (+) Poor Dentition, Chipped, Caps   Pulmonary shortness of breath and with exertion, COPD, Current Smoker,    Pulmonary exam normal breath sounds clear to auscultation       Cardiovascular Exercise Tolerance: Good hypertension, + CAD, + Peripheral Vascular Disease and +CHF  Normal cardiovascular exam Rhythm:regular Rate:Normal     Neuro/Psych CVA (reports new limp), Residual Symptoms negative psych ROS   GI/Hepatic negative GI ROS, Neg liver ROS,   Endo/Other  diabetes, Type 2  Renal/GU DialysisRenal disease  negative genitourinary   Musculoskeletal   Abdominal   Peds  Hematology negative hematology ROS (+)   Anesthesia Other Findings Past Medical History: No date: COPD (chronic obstructive pulmonary disease) (* No date: Dialysis patient ([place])     Comment: Tues. Thurs. Sat. No date: Dyspnea     Comment: with exertion No date: ESRD (end stage renal disease) on dialysis ([facility]*     Comment: "TTS; [facility]; Phillip Heal, [place]" (06/22/2016) No date: History of gout No date: Hyperlipidemia No date: Hypertension No date: Peripheral vascular disease ([place]) 06/20/2016: Stroke ([place])     Comment: denies residual on 06/22/2016 No date: Type II diabetes mellitus ([place]) No date: Umbilical hernia  Past Surgical History: No date: CARDIAC CATHETERIZATION 06/28/2016: DIALYSIS/PERMA CATHETER INSERTION N/A     Comment: Procedure: Dialysis/Perma Catheter Insertion;               Surgeon: Katha Cabal, MD;  Location: [facility];  Service: Cardiovascular;    Laterality: N/A; No date: EXTERIORIZATION OF A CONTINUOUS AMBULATORY PER* No date: FRACTURE SURGERY 03/08/2016: PERIPHERAL VASCULAR CATHETERIZATION N/A     Comment: Procedure: Dialysis/Perma Catheter Insertion;               Surgeon: Katha Cabal, MD;  Location: [facility];  Service: Cardiovascular;                Laterality: N/A; 04/20/2016: PERIPHERAL VASCULAR CATHETERIZATION N/A     Comment: Procedure: Dialysis/Perma Catheter Removal;                Surgeon: Algernon Huxley, MD;  Location: [facility];  Service: Cardiovascular;                Laterality: N/A; 05/25/2016: PERIPHERAL VASCULAR CATHETERIZATION N/A     Comment: Procedure: Dialysis/Perma Catheter Insertion;               Surgeon: Algernon Huxley, MD;  Location: [facility];  Service: Cardiovascular;                Laterality: N/A; 06/23/2016: RIGHT/LEFT HEART CATH AND CORONARY ANGIOGRAPHY N/A     Comment: Procedure: Right/Left Heart Cath and Coronary               Angiography;  Surgeon: Sherren Mocha, MD;                Location: [facility];  Service:               Cardiovascular;  Laterality: N/A; 06/26/2016: TEE WITHOUT CARDIOVERSION N/A     Comment: Procedure: TRANSESOPHAGEAL ECHOCARDIOGRAM               (TEE);  Surgeon: Fay Records, MD;  Location:               [facility] END[facility];  Service: Cardiovascular;                Laterality: N/A; ~ 2000: WRIST FRACTURE SURGERY Right     Comment: "got 8 screws in there"     Reproductive/Obstetrics negative OB ROS                             Anesthesia Physical Anesthesia Plan  ASA: IV  Anesthesia Plan: MAC and Regional   Post-op Pain Management:    Induction:   Airway Management Planned:   Additional Equipment:   Intra-op Plan:   Post-operative Plan:   Informed Consent: I have reviewed the patients History and Physical, chart, labs and discussed the procedure  including the risks, benefits and alternatives for the proposed anesthesia with the patient or authorized representative who has indicated his/her understanding and acceptance.   Dental Advisory Given  Plan Discussed with: Anesthesiologist, CRNA and Surgeon  Anesthesia Plan Comments: (Patient informed that they are higher risk for complications from anesthesia during this procedure due to their medical history.  Patient voiced understanding. )        Anesthesia Quick Evaluation

## 2016-07-28 NOTE — Op Note (Signed)
     OPERATIVE NOTE   PROCEDURE: left brachial cephalic arteriovenous fistula placement  PRE-OPERATIVE DIAGNOSIS: End Stage Renal Disease  POST-OPERATIVE DIAGNOSIS: End Stage Renal Disease  SURGEON: Levora Dredge  ASSISTANT(S): None  ANESTHESIA: regional  ESTIMATED BLOOD LOSS: <50 cc  FINDING(S): 4 mm cephalic vein  SPECIMEN(S):  none  INDICATIONS:   Justin Fitzgerald is a 56 y.o. male who presents with end stage renal disease.  The patient is scheduled for left brachiocephalic arteriovenous fistula placement.  The patient is aware the risks include but are not limited to: bleeding, infection, steal syndrome, nerve damage, ischemic monomelic neuropathy, failure to mature, and need for additional procedures.  The patient is aware of the risks of the procedure and elects to proceed forward.  DESCRIPTION: After full informed written consent was obtained from the patient, the patient was brought back to the operating room and placed supine upon the operating table.  Prior to induction, the patient received IV antibiotics.   After obtaining adequate anesthesia, the patient was then prepped and draped in the standard fashion for a left arm access procedure.    A curvilinear incision was then created midway between the radial impulse and the cephalic vein. The cephalic vein was then identified and dissected circumferentially. It was marked with a surgical marker.    Attention was then turned to the brachial artery which was exposed through the same incision and looped proximally and distally. Side branches were controlled with 4-0 silk ties.  The distal segment of the vein was ligated with a  2-0 silk, and the vein was transected.  The proximal segment was interrogated with serial dilators.  The vein accepted up to a 4 mm dilator without any difficulty. Heparinized saline was infused into the vein and clamped it with a small bulldog.  At this point, I reset my exposure of the  brachial artery and controlled the artery with vessel loops proximally and distally.  An arteriotomy was then made with a #11 blade, and extended with a Potts scissor.  Heparinized saline was injected proximal and distal into the radial artery.  The vein was then approximated to the artery while the artery was in its native bed and subsequently the vein was beveled using Potts scissors. The vein was then sewn to the artery in an end-to-side configuration with a running stitch of 6-0 Prolene.  Prior to completing this anastomosis Flushing maneuvers were performed and the artery was allowed to forward and back bleed.  There was no evidence of clot from any vessels.  I completed the anastomosis in the usual fashion and then released all vessel loops and clamps.    There was good  thrill in the venous outflow, and there was 1+ palpable radial pulse.  At this point, I irrigated out the surgical wound.  There was no further active bleeding.  The subcutaneous tissue was reapproximated with a running stitch of 3-0 Vicryl.  The skin was then reapproximated with a running subcuticular stitch of 4-0 Vicryl.  The skin was then cleaned, dried, and reinforced with Dermabond.    The patient tolerated this procedure well.   COMPLICATIONS: None  CONDITION: [name]  Office: [phone]   07/28/2016, 9:59 AM

## 2016-07-28 NOTE — Anesthesia Procedure Notes (Signed)
Anesthesia Regional Block: Interscalene brachial plexus block   Pre-Anesthetic Checklist: ,, timeout performed, Correct Patient, Correct Site, Correct Laterality, Correct Procedure, Correct Position, site marked, Risks and benefits discussed,  Surgical consent,  Pre-op evaluation,  At surgeon's request and post-op pain management  Laterality: Upper and Left  Prep: chloraprep       Needles:  Injection technique: Single-shot  Needle Type: Stimiplex     Needle Length: 5cm  Needle Gauge: 22     Additional Needles:   Procedures: ultrasound guided,,,,,,,,  Narrative:  Start time: 07/28/2016 7:15 AM End time: 07/28/2016 7:18 AM Injection made incrementally with aspirations every 5 mL.  Performed by: Personally  Anesthesiologist: Margorie John K  Additional Notes: Functioning IV was confirmed and monitors were applied.  A 40mm 22ga Stimuplex needle was used. Sterile prep,hand hygiene and sterile gloves were used.  Negative aspiration and negative test dose prior to incremental administration of local anesthetic. The patient tolerated the procedure well with no immediate complications.

## 2016-07-28 NOTE — Progress Notes (Signed)
Report given to Lannie Fields, patient taken to PACU for Block.  Report also given to Telford Nab RN.

## 2016-07-28 NOTE — Anesthesia Postprocedure Evaluation (Signed)
Anesthesia Post Note  Patient: Bram Hasser  Procedure(s) Performed: Procedure(s) (LRB): ARTERIOVENOUS (AV) FISTULA CREATION (Left)  Patient location during evaluation: PACU Anesthesia Type: Regional Level of consciousness: awake and alert Pain management: pain level controlled Vital Signs Assessment: post-procedure vital signs reviewed and stable Respiratory status: spontaneous breathing, nonlabored ventilation, respiratory function stable and patient connected to nasal cannula oxygen Cardiovascular status: blood pressure returned to baseline and stable Postop Assessment: no signs of nausea or vomiting Anesthetic complications: no     Last Vitals:  Vitals:   07/28/16 1028 07/28/16 1106  BP: (!) 101/59 105/62  Pulse: 87 91  Resp: (!) 22 20  Temp: 36.2 C     Last Pain:  Vitals:   07/28/16 1106  TempSrc:   PainSc: 0-No pain                 Cleda Mccreedy Bethanne Mule

## 2016-07-28 NOTE — Anesthesia Post-op Follow-up Note (Cosign Needed)
Anesthesia QCDR form completed.        

## 2016-07-28 NOTE — Discharge Instructions (Signed)
AV Fistula Placement, Care After Refer to this sheet in the next few weeks. These instructions provide you with information about caring for yourself after your procedure. Your health care provider may also give you more specific instructions. Your treatment has been planned according to current medical practices, but problems sometimes occur. Call your health care provider if you have any problems or questions after your procedure. What can I expect after the procedure? After your procedure, it is common to:  Feel sore.  Have numbness.  Feel cold.  Feel a vibration (thrill) over the fistula. Follow these instructions at home:   Incision care   Do not take baths or showers, swim, or use a hot tub until your health care provider approves.  Keep the area around your cut from surgery (incision) clean and dry.  Follow instructions from your health care provider about how to take care of your incision. Make sure you:  Wash your hands with soap and water before you change your bandage (dressing). If soap and water are not available, use hand sanitizer.  Change your dressing as told by your health care provider.  Leave stitches (sutures) and clips in place. They may need to stay in place for 2 weeks or longer. Fistula Care   Check your fistula site every day to make sure the thrill feels the same.  Check your fistula site every day for signs of infection. Watch for:  Redness.  Swelling.  Discharge.  Tenderness.  Enlargement.  Keep your arm raised (elevated) while you rest.  Do not lift anything that is heavier than a gallon of milk with the arm that has the fistula.  Do not lie down on your fistula arm.  Do not let anyone draw blood or take a blood pressure reading on your fistula arm.  Do not wear tight jewelry or clothing over your fistula arm. General instructions   Rest at home for a day or two.  Gradually start doing your usual activities again. Ask your surgeon  when you can return to work or school.  Take over-the-counter and prescription medicines only as told by your health care provider.  Keep all follow-up visits as told by your health care provider. This is important. Contact a health care provider if:  You have chills or a fever.  You have pain at your fistula site that is not going away.  You have numbness or coldness at your fistula site that is not going away.  You feel a decrease or a change in the thrill.  You have swelling in your arm or hand.  You have redness, swelling, discharge, tenderness, or enlargement at your fistula site. Get help right away if:  You are bleeding from your fistula site.  You have chest pain.  You have trouble breathing. This information is not intended to replace advice given to you by your health care provider. Make sure you discuss any questions you have with your health care provider. Document Released: 05/01/2005 Document Revised: 09/08/2015 Document Reviewed: 07/22/2014 Elsevier Interactive Patient Education  2017 [employer]  

## 2016-07-29 ENCOUNTER — Encounter: Payer: Self-pay | Admitting: Vascular Surgery

## 2016-08-07 ENCOUNTER — Ambulatory Visit (HOSPITAL_COMMUNITY)
Admission: RE | Admit: 2016-08-07 | Discharge: 2016-08-07 | Disposition: A | Discharge status: Home / Self Care | Payer: Medicare Other | Source: Ambulatory Visit | Attending: Cardiovascular Disease | Admitting: Cardiovascular Disease

## 2016-08-07 ENCOUNTER — Encounter (HOSPITAL_COMMUNITY): Payer: Self-pay

## 2016-08-07 ENCOUNTER — Ambulatory Visit: Payer: Self-pay | Admitting: Cardiovascular Disease

## 2016-08-07 ENCOUNTER — Other Ambulatory Visit: Payer: Self-pay | Admitting: Cardiovascular Disease

## 2016-08-07 ENCOUNTER — Ambulatory Visit (HOSPITAL_COMMUNITY)
Admission: RE | Admit: 2016-08-07 | Discharge: 2016-08-07 | Disposition: A | Discharge status: Home / Self Care | Payer: Medicare Other | Source: Ambulatory Visit | Attending: Physician Assistant | Admitting: Physician Assistant

## 2016-08-07 DIAGNOSIS — I700 Atherosclerosis of aorta: Secondary | ICD-10-CM | POA: Diagnosis not present

## 2016-08-07 DIAGNOSIS — I10 Essential (primary) hypertension: Secondary | ICD-10-CM | POA: Diagnosis present

## 2016-08-07 DIAGNOSIS — R031 Nonspecific low blood-pressure reading: Secondary | ICD-10-CM | POA: Diagnosis not present

## 2016-08-07 DIAGNOSIS — I878 Other specified disorders of veins: Secondary | ICD-10-CM

## 2016-08-07 DIAGNOSIS — I5189 Other ill-defined heart diseases: Secondary | ICD-10-CM | POA: Diagnosis present

## 2016-08-07 DIAGNOSIS — I2510 Atherosclerotic heart disease of native coronary artery without angina pectoris: Secondary | ICD-10-CM

## 2016-08-07 HISTORY — PX: IR GENERIC HISTORICAL: IMG1180011

## 2016-08-07 MED ORDER — IOPAMIDOL (ISOVUE-370) INJECTION 76%
80.0000 mL | Freq: Once | INTRAVENOUS | Status: AC | PRN
  Administered 2016-08-07: 80 mL via INTRAVENOUS

## 2016-08-07 MED ORDER — METOPROLOL TARTRATE 5 MG/5ML IV SOLN
5.0000 mg | Freq: Once | INTRAVENOUS | Status: DC
  Filled 2016-08-07: qty 5

## 2016-08-07 MED ORDER — METOPROLOL TARTRATE 5 MG/5ML IV SOLN
5.0000 mg | Freq: Once | INTRAVENOUS | Status: AC
  Administered 2016-08-07: 5 mg via INTRAVENOUS
  Filled 2016-08-07: qty 5

## 2016-08-07 MED ORDER — LIDOCAINE HCL 1 % IJ SOLN
INTRAMUSCULAR | Status: DC | PRN
  Administered 2016-08-07: 10 mL

## 2016-08-07 MED ORDER — METOPROLOL TARTRATE 5 MG/5ML IV SOLN
INTRAVENOUS | Status: AC
  Administered 2016-08-07: 5 mg via INTRAVENOUS
  Filled 2016-08-07: qty 10

## 2016-08-07 MED ORDER — IOPAMIDOL (ISOVUE-370) INJECTION 76%
INTRAVENOUS | Status: AC
  Filled 2016-08-07: qty 100

## 2016-08-07 MED ORDER — LIDOCAINE HCL (PF) 1 % IJ SOLN
INTRAMUSCULAR | Status: AC
  Filled 2016-08-07: qty 30

## 2016-08-07 NOTE — Procedures (Signed)
Successful placement of micro sheath to right brachial vein.  No complications  Ready for use.  Jerry Caras Conall Vangorder PA-C 08/07/2016 14:15

## 2016-08-07 NOTE — Progress Notes (Signed)
Micro sheath removed prior to d/c. VSS

## 2016-08-07 NOTE — Progress Notes (Signed)
Patient BP dropped to 83/72 after first dose of 5mg  iv metoprolol. Called Dr. Eden Emms and okay to hold second dose of medication. Patient awake and alert, asymptomatic. HR=70. BP rechecked 10 mins later and 103/59.

## 2016-08-08 ENCOUNTER — Other Ambulatory Visit (INDEPENDENT_AMBULATORY_CARE_PROVIDER_SITE_OTHER): Payer: Self-pay | Admitting: Nephrology

## 2016-08-09 NOTE — Progress Notes (Signed)
Cardiology Office Note  Date:  08/11/2016   ID:  Justin Fitzgerald, Justin Fitzgerald July 30, 1960, MRN [id number]  PCP:  Justin Baseman, MD   Chief Complaint  Patient presents with  . OTHER    F/u angiography c/o leg pain. Meds reviewed verbally with pt.    HPI:  Justin Fitzgerald is a 56 y.o. male with history of  ESRD on PD for at least 9 years recently started with HD,  Remote peritonitis infection, in hospital on ABX chronic combined systolic and diastolic heart failure with EF of 20%, admission in early 06/2016,  TEE With EF 35%, Nonischemic cardiomyopathy February 2018 hypertension,  PVD,  diabetes mellitus,  Still smoking, started young recent stroke in 2/18  who presents for follow-up of his stroke, chronic diastolic and systolic CHF   Recent hospitalization at [facility] for stroke February 2018 Hospital records reviewed as below transesophageal echo/TEE  ejection fraction 35% "walled off regiion next to aorta between left and noncoronary cusps of aortic valve that probably represents remote abscess. There does not appear to be a communication with the aorta. "  He had follow-up CT cardiac study Poor venous access, inadequate opacification of aortic root Moderately calcified aortic atherosclerosis 12 x 19 mm cavity behind non coronary cusp/ and [place]/LC fissure that may represent a sinus of Valsalva aneurysm with possible contained rupture vs wall off abscess.  Recommendation was for revisualization with TEE at 3 months  Cardiac catheterization study reviewed Heavily calcified coronary arteries with chronic occlusion of the first OM branch of the circumflex, moderate diffuse RCA stenosis, and mild nonobstructive LAD stenosis Normal right heart hemodynamics and normal LVEDP Known severe LV systolic dysfunction Medical management was recommended  Lab work reviewed previously by myself HBA1C 5.6 Total chol 170,  LDL 98  Reports that he feels well, occasional shortness of  breath HD every Tuesday, Thursday, and Saturday Continues to smoke Takes Lasix 160 mg twice a day Does not weigh himself  Other past medical history reviewed  admitted to [facility] in late December 2017 for acute hypoxic respiratory failure requiring oxygen supplementation in the setting of COPD exacerbation. treated with IV steroids and nebulizers and antibiotics. complicated with peritonitis requiring antibiotic therapy.  mildly elevated troponin  felt to be secondary to demand ischemia    admitted June 21 2016 with stroke after developing left facial droop and slurring of his speech   MRI brain demonstrated right MCA territory infarct felt to be embolic  Lower extremity dopplers were negative for DVT.  Echo showed newly diagnosed systolic heart failure with EF of 20-25%, diffuse hypokinesis, G1DD, mild aortic stenosis, aortic root mildly dilated, mildly to moderately calcified mitral annulus, left atrium moderately dilated.    30 day event monitor   asymptomatic NSVT while in the hospital, beta blocker was titrated as tolerated.   Right and left heart catheterization to 06/23/2016: Mid LAD 40% stenosed with calcified lesion, diffuse calcific LAD stenosis, nonobstructive. OM1 CTO. Proximal RCA 60% stenosed, mid to distal RCA 50% stenosed.     PMH:   has a past medical history of COPD (chronic obstructive pulmonary disease) (HCC); Dialysis patient [facility]); Dyspnea; ESRD (end stage renal disease) on dialysis (HCC); History of gout; Hyperlipidemia; Hypertension; Peripheral vascular disease (HCC); Stroke [facility]) (06/20/2016); Type II diabetes mellitus (HCC); and Umbilical hernia.  PSH:    Past Surgical History:  Procedure Laterality Date  . AV FISTULA PLACEMENT Left 07/28/2016   Procedure: ARTERIOVENOUS (AV) FISTULA CREATION;  Surgeon: Justin Dills, MD;  Location: [facility] ORS;  Service: Vascular;  Laterality: Left;  . CARDIAC CATHETERIZATION    . DIALYSIS/PERMA CATHETER INSERTION N/A  06/28/2016   Procedure: Dialysis/Perma Catheter Insertion;  Surgeon: Justin Dills, MD;  Location: [facility] INVASIVE CV LAB;  Service: Cardiovascular;  Laterality: N/A;  . EXTERIORIZATION OF A CONTINUOUS AMBULATORY PERITONEAL DIALYSIS CATHETER    . FRACTURE SURGERY    . IR GENERIC HISTORICAL  08/07/2016   IR VENIPUNCTURE 92YRS/OLDER BY MD 08/07/2016 Justin Click, MD [facility]-INTERV RAD  . IR GENERIC HISTORICAL  08/07/2016   IR US GUIDE VASC ACCESS RIGHT 08/07/2016 Justin Click, MD [facility]-INTERV RAD  . PERIPHERAL VASCULAR CATHETERIZATION N/A 03/08/2016   Procedure: Dialysis/Perma Catheter Insertion;  Surgeon: Justin Dills, MD;  Location: [facility] INVASIVE CV LAB;  Service: Cardiovascular;  Laterality: N/A;  . PERIPHERAL VASCULAR CATHETERIZATION N/A 04/20/2016   Procedure: Dialysis/Perma Catheter Removal;  Surgeon: Justin Needy, MD;  Location: [facility] INVASIVE CV LAB;  Service: Cardiovascular;  Laterality: N/A;  . PERIPHERAL VASCULAR CATHETERIZATION N/A 05/25/2016   Procedure: Dialysis/Perma Catheter Insertion;  Surgeon: Justin Needy, MD;  Location: [facility] INVASIVE CV LAB;  Service: Cardiovascular;  Laterality: N/A;  . RIGHT/LEFT HEART CATH AND CORONARY ANGIOGRAPHY N/A 06/23/2016   Procedure: Right/Left Heart Cath and Coronary Angiography;  Surgeon: Justin Bollman, MD;  Location: [facility] INVASIVE CV LAB;  Service: Cardiovascular;  Laterality: N/A;  . TEE WITHOUT CARDIOVERSION N/A 06/26/2016   Procedure: TRANSESOPHAGEAL ECHOCARDIOGRAM (TEE);  Surgeon: Justin Riffle, MD;  Location: [facility] ENDOSCOPY;  Service: Cardiovascular;  Laterality: N/A;  . WRIST FRACTURE SURGERY Right ~ 2000   "got 8 screws in there"    Current Outpatient Prescriptions  Medication Sig Dispense Refill  . aspirin EC 81 MG EC tablet Take 1 tablet (81 mg total) by mouth daily. 90 tablet 3  . atorvastatin (LIPITOR) 80 MG tablet Take 1 tablet (80 mg total) by mouth daily at 6 PM. 60 tablet 1  . beclomethasone (QVAR) 40 MCG/ACT inhaler Inhale 1 puff into the lungs 2  (two) times daily. 1 Inhaler 0  . carvedilol (COREG) 3.125 MG tablet Take 1 tablet (3.125 mg total) by mouth 2 (two) times daily with a meal. 60 tablet 1  . cinacalcet (SENSIPAR) 30 MG tablet Take 4 tablets (120 mg total) by mouth daily with supper. (Patient taking differently: Take 30 mg by mouth daily with supper. ) 120 tablet 0  . cinacalcet (SENSIPAR) 90 MG tablet Take 90 mg by mouth daily with supper.    . clopidogrel (PLAVIX) 75 MG tablet Take 75 mg by mouth daily.    . folic acid-vitamin b complex-vitamin c-selenium-zinc (DIALYVITE) 3 MG TABS tablet Take 1 tablet by mouth Every Tuesday,Thursday,and Saturday with dialysis.    . furosemide (LASIX) 80 MG tablet Take 160 mg by mouth 2 (two) times daily.    [name] gabapentin (NEURONTIN) 300 MG capsule Take 300 mg by mouth at bedtime.     [name] HYDROcodone-acetaminophen (NORCO) 5-325 MG tablet Take 1-2 tablets by mouth every 6 (six) hours as needed for moderate pain or severe pain. 50 tablet 0  . sevelamer carbonate (RENVELA) 2.4 g PACK Take 2.4 g by mouth 3 (three) times daily with meals. 90 each 0  . SPIRIVA HANDIHALER 18 MCG inhalation capsule Place 18 mcg into inhaler and inhale daily.      No current facility-administered medications for this visit.      Allergies:   Ramipril and Aspirin   Social History:  The patient  reports that  he has been smoking Cigarettes.  He has a 20.00 pack-year smoking history. He has quit using smokeless tobacco. His smokeless tobacco use included Chew. He reports that he does not drink alcohol or use drugs.   Family History:   family history includes CAD in his mother; Hypertension in his mother.    Review of Systems: Review of Systems  Constitutional: Negative.   Respiratory: Positive for shortness of breath.   Cardiovascular: Positive for leg swelling.  Gastrointestinal: Negative.   Musculoskeletal: Negative.   Neurological: Negative.   Psychiatric/Behavioral: Negative.   All other systems reviewed and are  negative.    PHYSICAL EXAM: VS:  BP (!) 98/50 (BP Location: Right Arm, Patient Position: Sitting, Cuff Size: Large)   Pulse 96   Ht 6' (1.829 m)   Wt (!) 311 lb 4 oz (141.2 kg)   BMI 42.21 kg/m  , BMI Body mass index is 42.21 kg/m. GEN: Well nourished, well developed, in no acute distress , Morbidly obese HEENT: normal  Neck: no JVD, carotid bruits, or masses Cardiac: RRR; no murmurs, rubs, or gallops,Nonpitting lower extremity edema  Respiratory:  clear to auscultation bilaterally, normal work of breathing GI: soft, nontender, nondistended, + BS MS: no deformity or atrophy  Skin: warm and dry, no rash Neuro:  Strength and sensation are intact Psych: euthymic mood, full affect    Recent Labs: 06/21/2016: ALT 14 06/23/2016: TSH 2.302 07/21/2016: BUN 26; Creatinine, Ser 6.86; Platelets 328 07/28/2016: Hemoglobin 11.9; Potassium 3.7; Sodium 131    Lipid Panel Lab Results  Component Value Date   CHOL 170 06/22/2016   HDL 35 (L) 06/22/2016   LDLCALC 98 06/22/2016   TRIG 185 (H) 06/22/2016      Wt Readings from Last 3 Encounters:  08/11/16 (!) 311 lb 4 oz (141.2 kg)  07/21/16 (!) 306 lb (138.8 kg)  07/17/16 (!) 312 lb (141.5 kg)       ASSESSMENT AND PLAN:  Essential hypertension Blood pressure is well controlled on today's visit. No changes made to the medications. Blood pressure tends to run low after dialysis  Cerebrovascular accident (CVA) due to embolism of right middle cerebral artery (HCC) 30 day monitor reviewed, final report still pending though reviewing each day there is no significant arrhythmia. We'll continue on aspirin and Plavix Unable to exclude mural thrombus given his cardiomyopathy  unable to use warfarin as he is on dialysis, hemodialysis  Chronic combined systolic and diastolic congestive heart failure (HCC) On high dose Lasix twice a day with hemodialysis  Peripheral vascular disease (HCC)  Mixed hyperlipidemia We will add zetia with  statin Goal LDL <70  Type 2 diabetes mellitus with stage 5 chronic kidney disease not on chronic dialysis, without long-term current use of insulin (HCC) We have encouraged continued exercise, careful diet management in an effort to lose weight. Hemoglobin A1c relatively well-controlled  Acute respiratory failure with hypoxia and hypercapnia (HCC) Respiratory status is stable Continues to smoke We have encouraged him to continue to work on weaning his cigarettes and smoking cessation. He will continue to work on this and does not want any assistance with chantix.   Aortic abscess Etiology unclear, recommendation made to consider revisualization by TEE in 3 months time Seen on TEE in the past and CT Cardiac scan    Disposition:   F/U  6 months   Total encounter time more than 45 minutes  Greater than 50% was spent in counseling and coordination of care with the patient  No orders of the defined types were placed in this encounter.    Signed, Dossie Arbour, M.D., Ph.D. 08/11/2016  [facility] [place], [place] [phone]

## 2016-08-11 ENCOUNTER — Ambulatory Visit (INDEPENDENT_AMBULATORY_CARE_PROVIDER_SITE_OTHER): Payer: Medicare Other | Admitting: Cardiovascular Disease

## 2016-08-11 ENCOUNTER — Encounter: Payer: Self-pay | Admitting: Cardiovascular Disease

## 2016-08-11 VITALS — BP 98/50 | HR 96 | Ht 72.0 in | Wt 311.2 lb

## 2016-08-11 DIAGNOSIS — I5042 Chronic combined systolic (congestive) and diastolic (congestive) heart failure: Secondary | ICD-10-CM

## 2016-08-11 DIAGNOSIS — I739 Peripheral vascular disease, unspecified: Secondary | ICD-10-CM

## 2016-08-11 DIAGNOSIS — J9602 Acute respiratory failure with hypercapnia: Secondary | ICD-10-CM | POA: Diagnosis not present

## 2016-08-11 DIAGNOSIS — I63411 Cerebral infarction due to embolism of right middle cerebral artery: Secondary | ICD-10-CM | POA: Diagnosis not present

## 2016-08-11 DIAGNOSIS — E782 Mixed hyperlipidemia: Secondary | ICD-10-CM | POA: Diagnosis not present

## 2016-08-11 DIAGNOSIS — N185 Chronic kidney disease, stage 5: Secondary | ICD-10-CM | POA: Diagnosis not present

## 2016-08-11 DIAGNOSIS — I639 Cerebral infarction, unspecified: Secondary | ICD-10-CM | POA: Diagnosis not present

## 2016-08-11 DIAGNOSIS — I10 Essential (primary) hypertension: Secondary | ICD-10-CM | POA: Diagnosis not present

## 2016-08-11 DIAGNOSIS — J9601 Acute respiratory failure with hypoxia: Secondary | ICD-10-CM

## 2016-08-11 DIAGNOSIS — E1122 Type 2 diabetes mellitus with diabetic chronic kidney disease: Secondary | ICD-10-CM

## 2016-08-11 MED ORDER — EZETIMIBE 10 MG PO TABS: 10.0000 mg | ORAL_TABLET | Freq: Every day | ORAL | 3 refills | Status: AC

## 2016-08-11 NOTE — Patient Instructions (Signed)
Medication Instructions:   Please start zetia one pill a day for cholesterol  Labwork:  No new labs needed  Testing/Procedures:  No further testing at this time   I recommend watching educational videos on topics of interest to you at:       [email or web address]  Enter code: HEARTCARE    Follow-Up: It was a pleasure seeing you in the office today. Please call us if you have new issues that need to be addressed before your next appt.  [phone]  Your physician wants you to follow-up in: 3 months.  You will receive a reminder letter in the mail two months in advance. If you don't receive a letter, please call our office to schedule the follow-up appointment.  If you need a refill on your cardiac medications before your next appointment, please call your pharmacy.

## 2016-08-19 ENCOUNTER — Other Ambulatory Visit: Payer: Self-pay | Admitting: Internal Medicine

## 2016-08-21 ENCOUNTER — Ambulatory Visit: Payer: Self-pay | Admitting: Neurology

## 2016-08-22 ENCOUNTER — Encounter: Payer: Self-pay | Admitting: Neurology

## 2016-08-24 ENCOUNTER — Other Ambulatory Visit: Payer: Self-pay | Admitting: Internal Medicine

## 2016-08-24 ENCOUNTER — Encounter (INDEPENDENT_AMBULATORY_CARE_PROVIDER_SITE_OTHER): Payer: Self-pay

## 2016-08-24 ENCOUNTER — Other Ambulatory Visit (INDEPENDENT_AMBULATORY_CARE_PROVIDER_SITE_OTHER): Payer: Self-pay | Admitting: Vascular Surgery

## 2016-08-25 ENCOUNTER — Encounter
Admission: RE | Disposition: A | Discharge status: Home / Self Care | Payer: Self-pay | Source: Ambulatory Visit | Attending: Vascular Surgery

## 2016-08-25 ENCOUNTER — Ambulatory Visit
Admission: RE | Admit: 2016-08-25 | Discharge: 2016-08-25 | Disposition: A | Discharge status: Home / Self Care | Payer: Medicare Other | Source: Ambulatory Visit | Attending: Vascular Surgery | Admitting: Vascular Surgery

## 2016-08-25 DIAGNOSIS — J9601 Acute respiratory failure with hypoxia: Secondary | ICD-10-CM | POA: Insufficient documentation

## 2016-08-25 DIAGNOSIS — J9602 Acute respiratory failure with hypercapnia: Secondary | ICD-10-CM | POA: Diagnosis not present

## 2016-08-25 DIAGNOSIS — I5042 Chronic combined systolic (congestive) and diastolic (congestive) heart failure: Secondary | ICD-10-CM | POA: Diagnosis not present

## 2016-08-25 DIAGNOSIS — I132 Hypertensive heart and chronic kidney disease with heart failure and with stage 5 chronic kidney disease, or end stage renal disease: Secondary | ICD-10-CM | POA: Diagnosis not present

## 2016-08-25 DIAGNOSIS — Z888 Allergy status to other drugs, medicaments and biological substances status: Secondary | ICD-10-CM | POA: Insufficient documentation

## 2016-08-25 DIAGNOSIS — Z9889 Other specified postprocedural states: Secondary | ICD-10-CM | POA: Insufficient documentation

## 2016-08-25 DIAGNOSIS — I63411 Cerebral infarction due to embolism of right middle cerebral artery: Secondary | ICD-10-CM | POA: Insufficient documentation

## 2016-08-25 DIAGNOSIS — Z8673 Personal history of transient ischemic attack (TIA), and cerebral infarction without residual deficits: Secondary | ICD-10-CM | POA: Insufficient documentation

## 2016-08-25 DIAGNOSIS — Z7902 Long term (current) use of antithrombotics/antiplatelets: Secondary | ICD-10-CM | POA: Diagnosis not present

## 2016-08-25 DIAGNOSIS — M109 Gout, unspecified: Secondary | ICD-10-CM | POA: Insufficient documentation

## 2016-08-25 DIAGNOSIS — Z992 Dependence on renal dialysis: Secondary | ICD-10-CM | POA: Insufficient documentation

## 2016-08-25 DIAGNOSIS — Z7982 Long term (current) use of aspirin: Secondary | ICD-10-CM | POA: Insufficient documentation

## 2016-08-25 DIAGNOSIS — N186 End stage renal disease: Secondary | ICD-10-CM | POA: Insufficient documentation

## 2016-08-25 DIAGNOSIS — J449 Chronic obstructive pulmonary disease, unspecified: Secondary | ICD-10-CM | POA: Diagnosis not present

## 2016-08-25 DIAGNOSIS — F17200 Nicotine dependence, unspecified, uncomplicated: Secondary | ICD-10-CM | POA: Insufficient documentation

## 2016-08-25 DIAGNOSIS — E1122 Type 2 diabetes mellitus with diabetic chronic kidney disease: Secondary | ICD-10-CM | POA: Diagnosis not present

## 2016-08-25 DIAGNOSIS — Z8249 Family history of ischemic heart disease and other diseases of the circulatory system: Secondary | ICD-10-CM | POA: Insufficient documentation

## 2016-08-25 HISTORY — PX: DIALYSIS/PERMA CATHETER INSERTION: CATH118288

## 2016-08-25 LAB — POTASSIUM (ARMC VASCULAR LAB ONLY): POTASSIUM (ARMC VASCULAR LAB): 4.8 (ref 3.5–5.1)

## 2016-08-25 SURGERY — DIALYSIS/PERMA CATHETER INSERTION
Anesthesia: Moderate Sedation

## 2016-08-25 MED ORDER — ONDANSETRON HCL 4 MG/2ML IJ SOLN: 4.0000 mg | Freq: Four times a day (QID) | INTRAMUSCULAR | Status: DC | PRN

## 2016-08-25 MED ORDER — CEFAZOLIN IN D5W 1 GM/50ML IV SOLN
INTRAVENOUS | Status: AC
  Filled 2016-08-25: qty 50

## 2016-08-25 MED ORDER — FENTANYL CITRATE (PF) 100 MCG/2ML IJ SOLN
INTRAMUSCULAR | Status: AC
  Filled 2016-08-25: qty 2

## 2016-08-25 MED ORDER — LIDOCAINE-EPINEPHRINE (PF) 2 %-1:200000 IJ SOLN
INTRAMUSCULAR | Status: AC
  Filled 2016-08-25: qty 20

## 2016-08-25 MED ORDER — HEPARIN SODIUM (PORCINE) 10000 UNIT/ML IJ SOLN
INTRAMUSCULAR | Status: AC
  Filled 2016-08-25: qty 1

## 2016-08-25 MED ORDER — FAMOTIDINE 20 MG PO TABS: 40.0000 mg | ORAL_TABLET | ORAL | Status: DC | PRN

## 2016-08-25 MED ORDER — METHYLPREDNISOLONE SODIUM SUCC 125 MG IJ SOLR: 125.0000 mg | INTRAMUSCULAR | Status: DC | PRN

## 2016-08-25 MED ORDER — CEFAZOLIN IN D5W 1 GM/50ML IV SOLN
1.0000 g | Freq: Once | INTRAVENOUS | Status: DC
  Administered 2016-08-25: 1 g via INTRAVENOUS

## 2016-08-25 MED ORDER — HEPARIN SODIUM (PORCINE) 1000 UNIT/ML IJ SOLN
INTRAMUSCULAR | Status: AC
  Filled 2016-08-25: qty 1

## 2016-08-25 MED ORDER — HYDROMORPHONE HCL 1 MG/ML IJ SOLN: 1.0000 mg | Freq: Once | INTRAMUSCULAR | Status: DC | PRN

## 2016-08-25 MED ORDER — FENTANYL CITRATE (PF) 100 MCG/2ML IJ SOLN
INTRAMUSCULAR | Status: DC | PRN
  Administered 2016-08-25 (×2): 25 ug via INTRAVENOUS
  Administered 2016-08-25: 50 ug via INTRAVENOUS

## 2016-08-25 MED ORDER — SODIUM CHLORIDE 0.9 % IV SOLN
INTRAVENOUS | Status: DC
  Administered 2016-08-25: 07:00:00 via INTRAVENOUS

## 2016-08-25 MED ORDER — MIDAZOLAM HCL 5 MG/5ML IJ SOLN
INTRAMUSCULAR | Status: AC
  Filled 2016-08-25: qty 5

## 2016-08-25 MED ORDER — MIDAZOLAM HCL 2 MG/2ML IJ SOLN
INTRAMUSCULAR | Status: DC | PRN
  Administered 2016-08-25: 2 mg via INTRAVENOUS
  Administered 2016-08-25 (×2): 1 mg via INTRAVENOUS

## 2016-08-25 SURGICAL SUPPLY — 7 items
CATH PALINDROME RT-P 15FX28CM (CATHETERS) ×3 IMPLANT
DERMABOND ADVANCED (GAUZE/BANDAGES/DRESSINGS) ×2
DERMABOND ADVANCED .7 DNX12 (GAUZE/BANDAGES/DRESSINGS) ×1 IMPLANT
GUIDEWIRE SUPER STIFF .035X180 (WIRE) ×3 IMPLANT
PACK ANGIOGRAPHY (CUSTOM PROCEDURE TRAY) ×3 IMPLANT
SUT MNCRL AB 4-0 PS2 18 (SUTURE) ×3 IMPLANT
SUT PROLENE 0 CT 1 30 (SUTURE) ×3 IMPLANT

## 2016-08-25 NOTE — Op Note (Signed)
OPERATIVE NOTE    PRE-OPERATIVE DIAGNOSIS: 1. ESRD 2. Non-functional permcath  POST-OPERATIVE DIAGNOSIS: same as above  PROCEDURE: 1. Fluoroscopic guidance for placement of catheter 2. Placement of a 28 cm tip to cuff tunneled hemodialysis catheter via the left internal jugular vein and removal of previous catheter  SURGEON: Festus Barren, MD  ANESTHESIA:  Local with moderate conscious sedation for 15 minutes using 4 mg of Versed and 100 mcg of Fentanyl  ESTIMATED BLOOD LOSS: 10 cc  FINDING(S): none  SPECIMEN(S):  None  INDICATIONS:   Patient is a 56 y.o.male who presents with non-functional dialysis catheter and ESRD.  The patient needs long term dialysis access for their ESRD, and a Permcath is necessary.  Risks and benefits are discussed and informed consent is obtained.    DESCRIPTION: After obtaining full informed written consent, the patient was brought back to the vascular suite. The patient received moderate conscious sedation during a face-to-face encounter with me present throughout the entire procedure and supervising the RN monitoring the vital signs, pulse oximetry, telemetry, and mental status throughout the entire procedure. The patient's existing catheter, left neck and chest were sterilely prepped and draped in a sterile surgical field was created.  The existing catheter was dissected free from the fibrous sheath securing the cuff with hemostats and blunt dissection.  A wire was placed. The existing catheter was then removed and the wire used to keep venous access. I selected a 28 cm tip to cuff tunneled dialysis catheter.  Using fluoroscopic guidance the catheter tips were parked in the right atrium. The appropriate distal connectors were placed. It withdrew blood well and flushed easily with heparinized saline and a concentrated heparin solution was then placed. It was secured to the chest wall with 2 Prolene sutures. A 4-0 Monocryl pursestring suture was placed around the  exit site. Sterile dressings were placed. The patient tolerated the procedure well and was taken to the recovery room in stable condition.  COMPLICATIONS: None  CONDITION: Stable  Festus Barren 08/25/2016 8:32 AM   This note was created with Dragon Medical transcription system. Any errors in dictation are purely unintentional.

## 2016-08-25 NOTE — Discharge Instructions (Signed)
Tunneled Catheter Insertion, Care After  Refer to this sheet in the next few weeks. These instructions provide you with information about caring for yourself after your procedure. Your health care provider may also give you more specific instructions. Your treatment has been planned according to current medical practices, but problems sometimes occur. Call your health care provider if you have any problems or questions after your procedure.  What can I expect after the procedure?  After the procedure, it is common to have:  · Some mild redness, swelling, and pain around your catheter site.  · A small amount of blood or clear fluid coming from your incisions.    Follow these instructions at home:  Incision care   · Check your incision areas every day for signs of infection. Check for:  ? More redness, swelling, or pain.  ? More fluid or blood.  ? Warmth.  ? Pus or a bad smell.  · Follow instructions from your health care provider about how to take care of your incisions. Make sure you:  ? Wash your hands with soap and water before you change your bandages (dressings). If soap and water are not available, use hand sanitizer.  ? Change your dressings as told by your health care provider. Wash the area around your incisions with a germ-killing (antiseptic) solution when you change your dressing, as told by your health care provider.  ? Leave stitches (sutures), skin glue, or adhesive strips in place. These skin closures may need to stay in place for 2 weeks or longer. If adhesive strip edges start to loosen and curl up, you may trim the loose edges. Do not remove adhesive strips completely unless your health care provider tells you to do that.  Catheter Care     · Wash your hands with soap and water before and after caring for your catheter. If soap and water are not available, use hand sanitizer.  · Keep your catheter site and your dressings clean and dry.  · Apply an antibiotic ointment to your catheter site as told  by your health care provider.  · Flush your catheter as told by your health care provider. This helps prevent it from becoming clogged.  · Do not open the caps on the ends of the catheter.  · Do not pull on your catheter.  · If your catheter is in your arm:  ? Avoid wearing tight clothes or tight jewelry on your arm that has the catheter.  ? Do not sleep with your head on the arm that has the catheter.  ? Do not allow your blood pressure to be taken on the arm that has the catheter.  ? Do not allow your blood to be drawn from the arm that has the catheter, except through the catheter itself.  Medicines   · Take over-the-counter and prescription medicines only as told by your health care provider.  · If you were prescribed an antibiotic medicine, take it as told by your health care provider. Do not stop taking the antibiotic even if you start to feel better.  Activity   · Return to your normal activities as told by your health care provider. Ask your health care provider what activities are safe for you.  · Do not lift anything that is heavier than 10 lb (4.5 kg) for 3 weeks or as long as told by your health care provider.  Driving   · Do not drive until your health care provider approves.  ·   Do not drive or operate heavy machinery while taking prescription pain medicine.  General instructions   · Follow your health care provider's specific instructions for the type of catheter that you have.  · Do not take baths, swim, or use a hot tub until your health care provider approves.  · Follow instructions from your health care provider about eating or drinking restrictions.  · Wear compression stockings as told by your health care provider. These stockings help to prevent blood clots and reduce swelling in your legs.  · Keep all follow-up visits as told by your health care provider. This is important.  Contact a health care provider if:  · You have more fluid or blood coming from your incisions.  · You have more redness,  swelling, or pain at your incisions or around the area where your catheter is inserted.  · Your incisions feel warm to the touch.  · You feel unusually weak.  · You feel nauseous.  · Your catheter is not working properly.  · You have blood or fluid draining from your catheter.  · You are unable to flush your catheter.  Get help right away if:  · Your catheter breaks.  · A hole develops in your catheter.  · Your catheter comes loose or gets pulled completely out. If this happens, press on your catheter site firmly with your hand or a clean cloth until you get medical help.  · Your catheter becomes blocked.  · You have swelling in your arm, shoulder, neck, or face.  · You develop chest pain.  · You have difficulty breathing.  · You feel dizzy or light-headed.  · You have pus or a bad smell coming from your incisions.  · You have a fever.  · You develop bleeding from your catheter or your insertion site, and your bleeding does not stop.  This information is not intended to replace advice given to you by your health care provider. Make sure you discuss any questions you have with your health care provider.  Document Released: 04/17/2012 Document Revised: 01/02/2016 Document Reviewed: 01/25/2015  Elsevier Interactive Patient Education © 2017 [employer]

## 2016-08-25 NOTE — H&P (Signed)
[facility] History & Physical Update  The patient was interviewed and re-examined.  The patient's previous History and Physical has been reviewed and is unchanged.  There is no change in the plan of care. We plan to proceed with the scheduled procedure.  Festus Barren, MD  08/25/2016, 8:13 AM

## 2016-08-28 ENCOUNTER — Encounter: Payer: Self-pay | Admitting: Vascular Surgery

## 2016-09-07 ENCOUNTER — Encounter: Payer: Self-pay | Admitting: Medical Oncology

## 2016-09-07 ENCOUNTER — Emergency Department
Admission: EM | Admit: 2016-09-07 | Discharge: 2016-09-07 | Disposition: A | Discharge status: Home / Self Care | Payer: Medicare Other | Attending: Emergency Medicine | Admitting: Emergency Medicine

## 2016-09-07 DIAGNOSIS — J449 Chronic obstructive pulmonary disease, unspecified: Secondary | ICD-10-CM | POA: Diagnosis not present

## 2016-09-07 DIAGNOSIS — Z452 Encounter for adjustment and management of vascular access device: Secondary | ICD-10-CM | POA: Insufficient documentation

## 2016-09-07 DIAGNOSIS — I5042 Chronic combined systolic (congestive) and diastolic (congestive) heart failure: Secondary | ICD-10-CM | POA: Diagnosis not present

## 2016-09-07 DIAGNOSIS — F17210 Nicotine dependence, cigarettes, uncomplicated: Secondary | ICD-10-CM | POA: Insufficient documentation

## 2016-09-07 DIAGNOSIS — Z7982 Long term (current) use of aspirin: Secondary | ICD-10-CM | POA: Insufficient documentation

## 2016-09-07 DIAGNOSIS — Z992 Dependence on renal dialysis: Secondary | ICD-10-CM | POA: Diagnosis not present

## 2016-09-07 DIAGNOSIS — E1122 Type 2 diabetes mellitus with diabetic chronic kidney disease: Secondary | ICD-10-CM | POA: Insufficient documentation

## 2016-09-07 DIAGNOSIS — N186 End stage renal disease: Secondary | ICD-10-CM | POA: Diagnosis not present

## 2016-09-07 DIAGNOSIS — I132 Hypertensive heart and chronic kidney disease with heart failure and with stage 5 chronic kidney disease, or end stage renal disease: Secondary | ICD-10-CM | POA: Diagnosis not present

## 2016-09-07 DIAGNOSIS — T829XXA Unspecified complication of cardiac and vascular prosthetic device, implant and graft, initial encounter: Secondary | ICD-10-CM

## 2016-09-07 NOTE — ED Provider Notes (Signed)
[facility] Emergency Department Provider Note  ____________________________________________  Time seen: Approximately 5:04 PM  I have reviewed the triage vital signs and the nursing notes.   HISTORY  Chief Complaint Vascular Access Problem    HPI Miran Kautzman is a 56 y.o. male with ESRD on HD presenting for perm catheter falling out. The patient went to dialysis today when he arrived home, his PermCath fell out. He states this is a second time this has happened. He is completely asymptomatic.   Past Medical History:  Diagnosis Date  . COPD (chronic obstructive pulmonary disease) (HCC)   . Dialysis patient (HCC)    Tues. Thurs. Sat.  [name] Dyspnea    with exertion  . ESRD (end stage renal disease) on dialysis (HCC)    "TTS; [facility] [place]" (06/22/2016)  . History of gout   . Hyperlipidemia   . Hypertension   . Peripheral vascular disease (HCC)   . Stroke (HCC) 06/20/2016   denies residual on 06/22/2016  . Type II diabetes mellitus (HCC)   . Umbilical hernia     Patient Active Problem List   Diagnosis Date Noted  . Complication of renal dialysis 07/18/2016  . Hyperlipidemia 07/18/2016  . Chronic combined systolic and diastolic congestive heart failure (HCC)   . CVA (cerebral vascular accident) (HCC) 06/21/2016  . Acute ischemic stroke (HCC) 06/21/2016  . Peripheral vascular disease (HCC) 06/21/2016  . Acute respiratory failure (HCC) 05/11/2016  . Peritonitis (HCC) 05/11/2016  . End stage renal disease (HCC) 03/27/2016  . Complication from renal dialysis device 03/27/2016  . Lymphedema 02/28/2016  . Essential hypertension 02/28/2016  . Type 2 diabetes mellitus (HCC) 02/28/2016    Past Surgical History:  Procedure Laterality Date  . AV FISTULA PLACEMENT Left 07/28/2016   Procedure: ARTERIOVENOUS (AV) FISTULA CREATION;  Surgeon: Renford Dills, MD;  Location: [facility] ORS;  Service: Vascular;  Laterality: Left;  . CARDIAC  CATHETERIZATION    . DIALYSIS/PERMA CATHETER INSERTION N/A 06/28/2016   Procedure: Dialysis/Perma Catheter Insertion;  Surgeon: Renford Dills, MD;  Location: [facility] INVASIVE CV LAB;  Service: Cardiovascular;  Laterality: N/A;  . DIALYSIS/PERMA CATHETER INSERTION N/A 08/25/2016   Procedure: Dialysis/Perma Catheter Insertion;  Surgeon: Annice Needy, MD;  Location: [facility] INVASIVE CV LAB;  Service: Cardiovascular;  Laterality: N/A;  . EXTERIORIZATION OF A CONTINUOUS AMBULATORY PERITONEAL DIALYSIS CATHETER    . FRACTURE SURGERY    . IR GENERIC HISTORICAL  08/07/2016   IR VENIPUNCTURE 23YRS/OLDER BY MD 08/07/2016 Jolaine Click, MD [facility]-INTER[facility]  . IR GENERIC HISTORICAL  08/07/2016   IR US GUIDE VASC ACCESS RIGHT 08/07/2016 Jolaine Click, MD [facility]  . PERIPHERAL VASCULAR CATHETERIZATION N/A 03/08/2016   Procedure: Dialysis/Perma Catheter Insertion;  Surgeon: Renford Dills, MD;  Location: [facility] INVASIVE CV LAB;  Service: Cardiovascular;  Laterality: N/A;  . PERIPHERAL VASCULAR CATHETERIZATION N/A 04/20/2016   Procedure: Dialysis/Perma Catheter Removal;  Surgeon: Annice Needy, MD;  Location: [facility] INVASIVE CV LAB;  Service: Cardiovascular;  Laterality: N/A;  . PERIPHERAL VASCULAR CATHETERIZATION N/A 05/25/2016   Procedure: Dialysis/Perma Catheter Insertion;  Surgeon: Annice Needy, MD;  Location: [facility] INVASIVE CV LAB;  Service: Cardiovascular;  Laterality: N/A;  . RIGHT/LEFT HEART CATH AND CORONARY ANGIOGRAPHY N/A 06/23/2016   Procedure: Right/Left Heart Cath and Coronary Angiography;  Surgeon: [name], MD;  Location: [facility] INVASIVE CV LAB;  Service: Cardiovascular;  Laterality: N/A;  . TEE WITHOUT CARDIOVERSION N/A 06/26/2016   Procedure: TRANSESOPHAGEAL ECHOCARDIOGRAM (TEE);  Surgeon:  Pricilla Riffle, MD;  Location: [facility] ENDOSCOPY;  Service: Cardiovascular;  Laterality: N/A;  . WRIST FRACTURE SURGERY Right ~ 2000   "got 8 screws in there"    Current Outpatient Rx  . Order #: [id number] Class: Normal  . Order  #: [id number] Class: Normal  . Order #: [id number] Class: Print  . Order #: [id number] Class: Normal  . Order #: [id number] Class: Print  . Order #: [id number] Class: Historical Med  . Order #: [id number] Class: Historical Med  . Order #: [id number] Class: Normal  . Order #: [id number] Class: Historical Med  . Order #: [id number] Class: Historical Med  . Order #: [id number] Class: Historical Med  . Order #: [id number] Class: Print  . Order #: [id number] Class: Historical Med    Allergies Ramipril  Family History  Problem Relation Age of Onset  . Hypertension Mother   . CAD Mother     Social History Social History  Substance Use Topics  . Smoking status: Current Every Day Smoker    Packs/day: 0.50    Years: 40.00    Types: Cigarettes  . Smokeless tobacco: Former User    Types: Chew     Comment: 06/22/2016 "quit chewing when I was 86 or 56 years old"  . Alcohol use No    Review of Systems Constitutional: No fever/chills. Eyes: No Eye discharge ENT: No congestion or rhinorrhea. Cardiovascular: Denies chest pain.  Respiratory: Denies shortness of breath.  No cough. Gastrointestinal: No abdominal pain.  No nausea, no vomiting.   Genitourinary: No longer makes urine Skin: Negative for rash. Positive for permacath placement area. Neurological: Negative for headaches. No focal numbness, tingling or weakness.  Hematological/Lymphatic:Permacath fell out 10-point ROS otherwise negative.  ____________________________________________   PHYSICAL EXAM:  VITAL SIGNS: ED Triage Vitals  Enc Vitals Group     BP 09/07/16 1621 (!) 97/59     Pulse Rate 09/07/16 1621 93     Resp 09/07/16 1621 18     Temp 09/07/16 1621 98.4 F (36.9 C)     Temp Source 09/07/16 1621 Oral     SpO2 09/07/16 1621 99 %     Weight 09/07/16 1622 (!) 316 lb (143.3 kg)     Height 09/07/16 1622 6' (1.829 m)     Head Circumference --      Peak Flow --      Pain Score --      Pain Loc --      Pain Edu? --      Excl. in GC?  --     Constitutional: Alert and oriented. Chronically ill appearing and in no acute distress. Answers questions appropriately. Eyes: Conjunctivae are normal.  EOMI. No scleral icterus. Head: Atraumatic. Nose: No congestion/rhinnorhea. Mouth/Throat: Mucous membranes are moist.  Neck: No stridor.  Supple.   CHEST: In the left upper chest wall, there is a 0.75 x 0.5 cm open wound where the patient's permacath had been placed without any surrounding erythema, swelling, pain. There is no bleeding or purulent discharge from the site. Cardiovascular: Normal rate Respiratory: Normal respiratory effort.   Gastrointestinal: Obese.  Neurologic:  A&Ox3.  Speech is clear.  Face and smile are symmetric.  EOMI.  Moves all extremities well. Skin:  Skin is warm, dry. Psychiatric: Mood and affect are normal.  ____________________________________________   LABS (all labs ordered are listed, but only abnormal results are displayed)  Labs Reviewed - No data to display ____________________________________________  EKG  Not indicated ____________________________________________  RADIOLOGY  No results found.  ____________________________________________   PROCEDURES  Procedure(s) performed:  None  Procedures  Critical Care performed: No ____________________________________________   INITIAL IMPRESSION / ASSESSMENT AND PLAN / ED COURSE  Pertinent labs & imaging results that were available during my care of the patient were reviewed by me and considered in my medical decision making (see chart for details).  56 y.o. male whose PermCath fell out after dialysis today. Overall, the patient has reassuring vital signs per his blood pressure is 97 are 59, but this is likely because his dialysis was completed today and he is a little volume down. No additional action is required to address this. I have spoken with Dr. Evaristo Bury, who will see the patient tomorrow for PermCath replacement. The  patient understands return precautions as well as follow-up instructions.  ____________________________________________  FINAL CLINICAL IMPRESSION(S) / ED DIAGNOSES  Final diagnoses:  None         NEW MEDICATIONS STARTED DURING THIS VISIT:  New Prescriptions   No medications on file      Rockne Menghini, MD 09/07/16 1715

## 2016-09-07 NOTE — ED Triage Notes (Signed)
Pt reports that he had dialysis this am and after he got home his perma cath fell out. Pt has area covered, no bleeding noted. Denies pain.

## 2016-09-07 NOTE — Discharge Instructions (Signed)
Please call Dr. Marijean Heath office to schedule an appointment to have your permcath replaced.  DO NOT EAT OR DRINK ANYTHING AFTER MIDNIGHT, or you will not be eligible for your procedure.  Return to the emergency department if you develop lightheadedness or fainting, fever, shortness of breath, or any other symptoms concerning to you.

## 2016-09-08 ENCOUNTER — Encounter: Payer: Self-pay | Admitting: Vascular Surgery

## 2016-09-08 ENCOUNTER — Ambulatory Visit
Admission: RE | Admit: 2016-09-08 | Discharge: 2016-09-08 | Disposition: A | Discharge status: Home / Self Care | Payer: Medicare Other | Source: Ambulatory Visit | Attending: Vascular Surgery | Admitting: Vascular Surgery

## 2016-09-08 ENCOUNTER — Encounter
Admission: RE | Disposition: A | Discharge status: Home / Self Care | Payer: Self-pay | Source: Ambulatory Visit | Attending: Vascular Surgery

## 2016-09-08 DIAGNOSIS — Z888 Allergy status to other drugs, medicaments and biological substances status: Secondary | ICD-10-CM | POA: Diagnosis not present

## 2016-09-08 DIAGNOSIS — M109 Gout, unspecified: Secondary | ICD-10-CM | POA: Diagnosis not present

## 2016-09-08 DIAGNOSIS — N186 End stage renal disease: Secondary | ICD-10-CM | POA: Insufficient documentation

## 2016-09-08 DIAGNOSIS — J449 Chronic obstructive pulmonary disease, unspecified: Secondary | ICD-10-CM | POA: Diagnosis not present

## 2016-09-08 DIAGNOSIS — Z955 Presence of coronary angioplasty implant and graft: Secondary | ICD-10-CM | POA: Diagnosis not present

## 2016-09-08 DIAGNOSIS — Z9889 Other specified postprocedural states: Secondary | ICD-10-CM | POA: Insufficient documentation

## 2016-09-08 DIAGNOSIS — Z8249 Family history of ischemic heart disease and other diseases of the circulatory system: Secondary | ICD-10-CM | POA: Insufficient documentation

## 2016-09-08 DIAGNOSIS — Z992 Dependence on renal dialysis: Secondary | ICD-10-CM | POA: Diagnosis not present

## 2016-09-08 DIAGNOSIS — E785 Hyperlipidemia, unspecified: Secondary | ICD-10-CM | POA: Diagnosis not present

## 2016-09-08 DIAGNOSIS — F17210 Nicotine dependence, cigarettes, uncomplicated: Secondary | ICD-10-CM | POA: Insufficient documentation

## 2016-09-08 DIAGNOSIS — I120 Hypertensive chronic kidney disease with stage 5 chronic kidney disease or end stage renal disease: Secondary | ICD-10-CM | POA: Insufficient documentation

## 2016-09-08 DIAGNOSIS — Z8673 Personal history of transient ischemic attack (TIA), and cerebral infarction without residual deficits: Secondary | ICD-10-CM | POA: Insufficient documentation

## 2016-09-08 DIAGNOSIS — E1151 Type 2 diabetes mellitus with diabetic peripheral angiopathy without gangrene: Secondary | ICD-10-CM | POA: Insufficient documentation

## 2016-09-08 DIAGNOSIS — E1122 Type 2 diabetes mellitus with diabetic chronic kidney disease: Secondary | ICD-10-CM | POA: Diagnosis not present

## 2016-09-08 HISTORY — PX: DIALYSIS/PERMA CATHETER INSERTION: CATH118288

## 2016-09-08 LAB — GLUCOSE, CAPILLARY: Glucose-Capillary: 79 mg/dL (ref 65–99)

## 2016-09-08 SURGERY — DIALYSIS/PERMA CATHETER INSERTION
Anesthesia: Moderate Sedation

## 2016-09-08 MED ORDER — MIDAZOLAM HCL 2 MG/2ML IJ SOLN
INTRAMUSCULAR | Status: DC | PRN
  Administered 2016-09-08 (×2): 2 mg via INTRAVENOUS
  Administered 2016-09-08: 1 mg via INTRAVENOUS

## 2016-09-08 MED ORDER — MIDAZOLAM HCL 5 MG/5ML IJ SOLN
INTRAMUSCULAR | Status: AC
  Filled 2016-09-08: qty 5

## 2016-09-08 MED ORDER — CEFAZOLIN SODIUM-DEXTROSE 2-4 GM/100ML-% IV SOLN
2.0000 g | INTRAVENOUS | Status: AC
  Administered 2016-09-08: 2 g via INTRAVENOUS

## 2016-09-08 MED ORDER — LIDOCAINE-EPINEPHRINE (PF) 2 %-1:200000 IJ SOLN
INTRAMUSCULAR | Status: AC
  Filled 2016-09-08: qty 20

## 2016-09-08 MED ORDER — FENTANYL CITRATE (PF) 100 MCG/2ML IJ SOLN
INTRAMUSCULAR | Status: DC | PRN
  Administered 2016-09-08 (×3): 50 ug via INTRAVENOUS

## 2016-09-08 MED ORDER — HEPARIN (PORCINE) IN NACL 2-0.9 UNIT/ML-% IJ SOLN
INTRAMUSCULAR | Status: AC
  Filled 2016-09-08: qty 500

## 2016-09-08 MED ORDER — FENTANYL CITRATE (PF) 100 MCG/2ML IJ SOLN
INTRAMUSCULAR | Status: AC
Start: 2016-09-08 — End: 2016-09-08
  Filled 2016-09-08: qty 2

## 2016-09-08 MED ORDER — FENTANYL CITRATE (PF) 100 MCG/2ML IJ SOLN
INTRAMUSCULAR | Status: AC
  Filled 2016-09-08: qty 2

## 2016-09-08 MED ORDER — HEPARIN SODIUM (PORCINE) 10000 UNIT/ML IJ SOLN
INTRAMUSCULAR | Status: AC
  Filled 2016-09-08: qty 1

## 2016-09-08 SURGICAL SUPPLY — 10 items
CATH PALINDROME-P 44CM KIT (CATHETERS) ×2
DERMABOND ADVANCED (GAUZE/BANDAGES/DRESSINGS) ×2
DERMABOND ADVANCED .7 DNX12 (GAUZE/BANDAGES/DRESSINGS) ×1 IMPLANT
DRAPE BRACHIAL (DRAPES) ×3 IMPLANT
KIT CATH 64X44X15FR RVRS (CATHETERS) ×1 IMPLANT
NEEDLE ENTRY 21GA 7CM ECHOTIP (NEEDLE) ×3 IMPLANT
PACK ANGIOGRAPHY (CUSTOM PROCEDURE TRAY) ×3 IMPLANT
SET INTRO CAPELLA COAXIAL (SET/KITS/TRAYS/PACK) ×3 IMPLANT
SUT MNCRL AB 4-0 PS2 18 (SUTURE) ×3 IMPLANT
SUT PROLENE 0 CT 1 30 (SUTURE) ×3 IMPLANT

## 2016-09-08 NOTE — Op Note (Signed)
OPERATIVE NOTE   PROCEDURE: 1. Insertion of tunneled dialysis catheter right femoral approach with ultrasound and fluoroscopic guidance.  PRE-OPERATIVE DIAGNOSIS: End-stage renal disease requiring hemodialysis; complication of dialysis device  POST-OPERATIVE DIAGNOSIS: Same  SURGEON: Levora Dredge.  ANESTHESIA: Conscious sedation was administered under my direct supervision by the interventional radiology RN. IV Versed plus fentanyl were utilized. Continuous ECG, pulse oximetry and blood pressure was monitored throughout the entire procedure. Conscious sedation was for a total of 27 minutes.  ESTIMATED BLOOD LOSS: Minimal cc  CONTRAST USED:  None  FLUOROSCOPY TIME:  0.2 minutes  INDICATIONS:   Justin Franzel Blackwellis a 56 y.o. y.o. male who presents with lack of dialysis access. He came to the emergency room with his previous tunneled catheter in a bag. He states it fell out. The risks and benefits for reinsertion of a tunneled catheter reviewed all questions answered patient agrees to proceed.  DESCRIPTION: After obtaining full informed written consent, the patient was positioned supine. The second right groin was prepped and draped in a sterile fashion. Ultrasound was placed in a sterile sleeve. Ultrasound was utilized to identify the right common femoral vein which is noted to be echolucent and compressible indicating patency. Image is recorded for the permanent record. Under direct ultrasound visualization a micro-needle is inserted into the vein followed by the micro-wire. Micro-sheath was then advanced and a J wire is inserted without difficulty under fluoroscopic guidance. Small counterincision was made at the wire insertion site. Dilators are passed over the wire and the tunneled dialysis catheter is fed into the central venous system without difficulty.  Under fluoroscopy the catheter tip positioned at the atrial caval junction. The catheter is then approximated to the chest  wall and an exit site selected. 1% lidocaine is infiltrated in soft tissues at this level small incision is made and the tunneling device is then passed from the exit site to the neck counterincision. Catheter is then connected to the tunneling device and the catheter was pulled subcutaneously. It is then transected and the hub assembly connected without difficulty. Both lumens aspirate and flush easily. After verification of smooth contour with proper tip position under fluoroscopy the catheter is packed with 5000 units of heparin per lumen.  Catheter secured to the skin of the right thigh with 0 silk. A sterile dressing is applied with a Biopatch.  COMPLICATIONS: None  CONDITION: Good  Levora Dredge [facility] [facility]. Office:  [phone]   09/08/2016,11:35 AM

## 2016-09-08 NOTE — OR Nursing (Signed)
MD at bedside to speak with pt. Re: procedure/results, and follow-up. Pt. Verbalized understanding.

## 2016-09-08 NOTE — H&P (Signed)
[facility] Admission History & Physical  MRN : [id number]  Justin Fitzgerald is a 56 y.o. (11-01-1960) male who presents with chief complaint of No chief complaint on file. [name]  History of Present Illness: I am asked to evaluate the patient by the  ER. The patient was sent here because he states the tunneled catheter that was in his left IJ "fell out".  Patient denies fever or chills. He denies pain in the left groin at the catheter site. He states he has no idea what happened it just came out. The patient reports they're not been any problems with any of their dialysis runs.   He does have a functioning arm access but this has not matured to the point it can be used. Patient denies pain or tenderness overlying the access.  There is no pain with dialysis.  The patient denies hand pain or finger pain consistent with steal syndrome.  No fevers or chills while on dialysis.   Current Facility-Administered Medications  Medication Dose Route Frequency Provider Last Rate Last Dose  . ceFAZolin (ANCEF) IVPB 2g/100 mL premix  2 g Intravenous On Call to OR Renford Dills, MD        Past Medical History:  Diagnosis Date  . COPD (chronic obstructive pulmonary disease) (HCC)   . Dialysis patient (HCC)    Tues. Thurs. Sat.  [name] Dyspnea    with exertion  . ESRD (end stage renal disease) on dialysis (HCC)    "TTS; [facility] [place]" (06/22/2016)  . History of gout   . Hyperlipidemia   . Hypertension   . Peripheral vascular disease (HCC)   . Stroke (HCC) 06/20/2016   denies residual on 06/22/2016  . Type II diabetes mellitus (HCC)   . Umbilical hernia     Past Surgical History:  Procedure Laterality Date  . AV FISTULA PLACEMENT Left 07/28/2016   Procedure: ARTERIOVENOUS (AV) FISTULA CREATION;  Surgeon: Renford Dills, MD;  Location: [facility] ORS;  Service: Vascular;  Laterality: Left;  . CARDIAC CATHETERIZATION    . DIALYSIS/PERMA CATHETER INSERTION N/A 06/28/2016    Procedure: Dialysis/Perma Catheter Insertion;  Surgeon: Renford Dills, MD;  Location: [facility] INVASIVE CV LAB;  Service: Cardiovascular;  Laterality: N/A;  . DIALYSIS/PERMA CATHETER INSERTION N/A 08/25/2016   Procedure: Dialysis/Perma Catheter Insertion;  Surgeon: Annice Needy, MD;  Location: [facility] INVASIVE CV LAB;  Service: Cardiovascular;  Laterality: N/A;  . EXTERIORIZATION OF A CONTINUOUS AMBULATORY PERITONEAL DIALYSIS CATHETER    . FRACTURE SURGERY    . IR GENERIC HISTORICAL  08/07/2016   IR VENIPUNCTURE 9YRS/OLDER BY MD 08/07/2016 [facility] Click, MD [facility]  . IR GENERIC HISTORICAL  08/07/2016   IR US GUIDE VASC ACCESS RIGHT 08/07/2016 Jol[facility] Click, MD [facility]  . PERIPHERAL VASCULAR CATHETERIZATION N/A 03/08/2016   Procedure: Dialysis/Perma Catheter Insertion;  Surgeon: Renford Dills, MD;  Location: [facility] INVASIVE CV LAB;  Service: Cardiovascular;  Laterality: N/A;  . PERIPHERAL VASCULAR CATHETERIZATION N/A 04/20/2016   Procedure: Dialysis/Perma Catheter Removal;  Surgeon: Annice Needy, MD;  Location: [facility] INVASIVE CV LAB;  Service: Cardiovascular;  Laterality: N/A;  . PERIPHERAL VASCULAR CATHETERIZATION N/A 05/25/2016   Procedure: Dialysis/Perma Catheter Insertion;  Surgeon: Annice Needy, MD;  Location: [facility] INVASIVE CV LAB;  Service: Cardiovascular;  Laterality: N/A;  . RIGHT/LEFT HEART CATH AND CORONARY ANGIOGRAPHY N/A 06/23/2016   Procedure: Right/Left Heart Cath and Coronary Angiography;  Surgeon: [name], MD;  Location: [facility] INVASIVE CV LAB;  Service: Cardiovascular;  Laterality: N/A;  . TEE WITHOUT CARDIOVERSION N/A 06/26/2016   Procedure: TRANSESOPHAGEAL ECHOCARDIOGRAM (TEE);  Surgeon: Pricilla Riffle, MD;  Location: [facility] ENDOSCOPY;  Service: Cardiovascular;  Laterality: N/A;  . WRIST FRACTURE SURGERY Right ~ 2000   "got 8 screws in there"    Social History Social History  Substance Use Topics  . Smoking status: Current Every Day Smoker    Packs/day: 0.50    Years: 40.00     Types: Cigarettes  . Smokeless tobacco: Former User    Types: Chew     Comment: 06/22/2016 "quit chewing when I was 37 or 56 years old"  . Alcohol use No    Family History Family History  Problem Relation Age of Onset  . Hypertension Mother   . CAD Mother     No family history of bleeding or clotting disorders, autoimmune disease or porphyria  Allergies  Allergen Reactions  . Ramipril Shortness Of Breath     REVIEW OF SYSTEMS (Negative unless checked)  Constitutional: [] Weight loss  [] Fever  [] Chills Cardiac: [] Chest pain   [] Chest pressure   [] Palpitations   [] Shortness of breath when laying flat   [] Shortness of breath at rest   [x] Shortness of breath with exertion. Vascular:  [] Pain in legs with walking   [] Pain in legs at rest   [] Pain in legs when laying flat   [] Claudication   [] Pain in feet when walking  [] Pain in feet at rest  [] Pain in feet when laying flat   [] History of DVT   [] Phlebitis   [] Swelling in legs   [] Varicose veins   [] Non-healing ulcers Pulmonary:   [] Uses home oxygen   [] Productive cough   [] Hemoptysis   [] Wheeze  [] COPD   [] Asthma Neurologic:  [] Dizziness  [] Blackouts   [] Seizures   [] History of stroke   [] History of TIA  [] Aphasia   [] Temporary blindness   [] Dysphagia   [] Weakness or numbness in arms   [] Weakness or numbness in legs Musculoskeletal:  [] Arthritis   [] Joint swelling   [] Joint pain   [] Low back pain Hematologic:  [] Easy bruising  [] Easy bleeding   [] Hypercoagulable state   [] Anemic  [] Hepatitis Gastrointestinal:  [] Blood in stool   [] Vomiting blood  [] Gastroesophageal reflux/heartburn   [] Difficulty swallowing. Genitourinary:  [x] Chronic kidney disease   [] Difficult urination  [] Frequent urination  [] Burning with urination   [] Blood in urine Skin:  [] Rashes   [] Ulcers   [] Wounds Psychological:  [] History of anxiety   []  History of major depression.  Physical Examination  Vitals:   09/08/16 1019  BP: 126/82  Resp: 20  Temp: 97.6 F (36.4  C)  TempSrc: Oral  SpO2: 100%  Weight: (!) 143.3 kg (316 lb)  Height: 6' (1.829 m)   Body mass index is 42.86 kg/m. Gen: WD/WN, NAD Head: [place]/AT, No temporalis wasting. Prominent temp pulse not noted. Ear/Nose/Throat: Hearing grossly intact, nares w/o erythema or drainage, oropharynx w/o Erythema/Exudate,  Eyes: Conjunctiva clear, sclera non-icteric Neck: Trachea midline.  No JVD.  Pulmonary:  Good air movement, respirations not labored, no use of accessory muscles.  Cardiac: RRR, normal S1, S2. Vascular: Left brachiocephalic fistula good thrill good bruit Vessel Right Left  Radial Palpable Palpable  Ulnar Not Palpable Not Palpable  Brachial Palpable Palpable  Carotid Palpable, without bruit Palpable, without bruit  Gastrointestinal: soft, non-tender/non-distended. No guarding/reflex.  Musculoskeletal: M/S 5/5 throughout.  Extremities without ischemic changes.  No deformity or atrophy.  Neurologic: Sensation grossly intact in extremities.  Symmetrical.  Speech is fluent. Motor exam as listed above. Psychiatric: Judgment intact, Mood & affect appropriate for pt's clinical situation. Dermatologic: No rashes or ulcers noted.  No cellulitis or open wounds. Lymph : No Cervical, Axillary, or Inguinal lymphadenopathy.   CBC Lab Results  Component Value Date   WBC 10.2 07/21/2016   HGB 11.9 (L) 07/28/2016   HCT 35.0 (L) 07/28/2016   MCV 89.4 07/21/2016   PLT 328 07/21/2016    BMET    Component Value Date/Time   NA 131 (L) 07/28/2016 0624   K 3.7 07/28/2016 0624   CL 93 (L) 07/21/2016 0929   CO2 27 07/21/2016 0929   GLUCOSE 96 07/28/2016 0624   BUN 26 (H) 07/21/2016 0929   CREATININE 6.86 (H) 07/21/2016 0929   CALCIUM 9.7 07/21/2016 0929   GFRNONAA 8 (L) 07/21/2016 0929   GFRAA 9 (L) 07/21/2016 0929   CrCl cannot be calculated (Patient's most recent lab result is older than the maximum 21 days allowed.).  COAG Lab Results  Component Value Date   INR 0.92 07/21/2016    INR 0.97 06/21/2016    Radiology No results found.  Assessment/Plan 1.  Complication dialysis device with thrombosis AV access:  Patient's Tunneled catheter is Out. The patient has an extremity access that is functioning well but is not ready to be cannulated. Therefore, the patient will undergo replacement of the tunneled catheter under conscious sedation.  The risks and benefits were described to the patient.  All questions were answered.  The patient agrees to proceed with angiography and intervention. Potassium will be drawn to ensure that it is an appropriate level prior to performing intervention. 2.  End-stage renal disease requiring hemodialysis:  Patient will continue dialysis therapy without further interruption if a successful intervention is not achieved then a tunneled catheter will be placed. Dialysis has already been arranged. 3.  Hypertension:  Patient will continue medical management; nephrology is following no changes in oral medications. 4. Diabetes mellitus:  Glucose will be monitored and oral medications been held this morning once the patient has undergone the patient's procedure po intake will be reinitiated and again Accu-Cheks will be used to assess the blood glucose level and treat as needed. The patient will be restarted on the patient's usual hypoglycemic regime 5.  Coronary artery disease:  EKG will be monitored. Nitrates will be used if needed. The patient's oral cardiac medications will be continued.    Levora Dredge, MD  09/08/2016 10:35 AM

## 2016-09-12 ENCOUNTER — Other Ambulatory Visit (INDEPENDENT_AMBULATORY_CARE_PROVIDER_SITE_OTHER): Payer: Self-pay | Admitting: Vascular Surgery

## 2016-09-12 ENCOUNTER — Ambulatory Visit
Admission: RE | Admit: 2016-09-12 | Discharge: 2016-09-12 | Disposition: A | Discharge status: Home / Self Care | Payer: Medicare Other | Source: Ambulatory Visit | Attending: Vascular Surgery | Admitting: Vascular Surgery

## 2016-09-12 DIAGNOSIS — N186 End stage renal disease: Secondary | ICD-10-CM

## 2016-09-12 DIAGNOSIS — F17210 Nicotine dependence, cigarettes, uncomplicated: Secondary | ICD-10-CM | POA: Insufficient documentation

## 2016-09-12 DIAGNOSIS — Z8673 Personal history of transient ischemic attack (TIA), and cerebral infarction without residual deficits: Secondary | ICD-10-CM | POA: Insufficient documentation

## 2016-09-12 DIAGNOSIS — J449 Chronic obstructive pulmonary disease, unspecified: Secondary | ICD-10-CM | POA: Insufficient documentation

## 2016-09-12 DIAGNOSIS — I120 Hypertensive chronic kidney disease with stage 5 chronic kidney disease or end stage renal disease: Secondary | ICD-10-CM | POA: Insufficient documentation

## 2016-09-12 DIAGNOSIS — Y838 Other surgical procedures as the cause of abnormal reaction of the patient, or of later complication, without mention of misadventure at the time of the procedure: Secondary | ICD-10-CM | POA: Insufficient documentation

## 2016-09-12 DIAGNOSIS — E1122 Type 2 diabetes mellitus with diabetic chronic kidney disease: Secondary | ICD-10-CM | POA: Diagnosis not present

## 2016-09-12 DIAGNOSIS — Z992 Dependence on renal dialysis: Secondary | ICD-10-CM | POA: Insufficient documentation

## 2016-09-12 DIAGNOSIS — Z7984 Long term (current) use of oral hypoglycemic drugs: Secondary | ICD-10-CM | POA: Insufficient documentation

## 2016-09-12 DIAGNOSIS — T8249XA Other complication of vascular dialysis catheter, initial encounter: Secondary | ICD-10-CM | POA: Insufficient documentation

## 2016-09-12 DIAGNOSIS — E1151 Type 2 diabetes mellitus with diabetic peripheral angiopathy without gangrene: Secondary | ICD-10-CM | POA: Insufficient documentation

## 2016-09-12 DIAGNOSIS — T82868A Thrombosis of vascular prosthetic devices, implants and grafts, initial encounter: Secondary | ICD-10-CM | POA: Diagnosis not present

## 2016-09-12 MED ORDER — ONDANSETRON HCL 4 MG/2ML IJ SOLN: 4.0000 mg | Freq: Four times a day (QID) | INTRAMUSCULAR | Status: DC | PRN

## 2016-09-12 MED ORDER — METHYLPREDNISOLONE SODIUM SUCC 125 MG IJ SOLR: 125.0000 mg | INTRAMUSCULAR | Status: DC | PRN

## 2016-09-12 MED ORDER — SODIUM CHLORIDE 0.9 % IV SOLN: 10.0000 mg | Freq: Once | INTRAVENOUS | Status: DC

## 2016-09-12 MED ORDER — FAMOTIDINE 20 MG PO TABS: 40.0000 mg | ORAL_TABLET | ORAL | Status: DC | PRN

## 2016-09-12 MED ORDER — CEFAZOLIN SODIUM-DEXTROSE 1-4 GM/50ML-% IV SOLN: 1.0000 g | Freq: Once | INTRAVENOUS | Status: DC

## 2016-09-12 MED ORDER — SODIUM CHLORIDE 0.9 % IV SOLN
5.0000 mg | Freq: Once | INTRAVENOUS | Status: AC
  Administered 2016-09-12: 5 mg via INTRAVENOUS
  Filled 2016-09-12: qty 5

## 2016-09-12 MED ORDER — ALTEPLASE 100 MG IV SOLR
5.0000 mg | Freq: Once | INTRAVENOUS | Status: DC
  Filled 2016-09-12 (×2): qty 5

## 2016-09-12 MED ORDER — HYDROMORPHONE HCL 1 MG/ML IJ SOLN: 1.0000 mg | Freq: Once | INTRAMUSCULAR | Status: DC | PRN

## 2016-09-12 MED ORDER — SODIUM CHLORIDE 0.9 % IV SOLN
5.0000 mg | Freq: Once | INTRAVENOUS | Status: DC
  Administered 2016-09-12: 5 mg via INTRAVENOUS
  Filled 2016-09-12: qty 5

## 2016-09-12 MED ORDER — SODIUM CHLORIDE 0.9 % IV SOLN: INTRAVENOUS | Status: DC

## 2016-09-12 NOTE — Op Note (Signed)
OPERATIVE NOTE   PROCEDURE: 1. Infusion of TPA for clearance of tunneled dialysis catheter  PRE-OPERATIVE DIAGNOSIS: Complication of tunneled dialysis catheter with poor flow, and stage renal disease requiring hemodialysis  POST-OPERATIVE DIAGNOSIS: Same  SURGEON: Renford Dills, MD  ANESTHESIA: None  ESTIMATED BLOOD LOSS: Minimal cc  FINDING(S): 1. Attempts at aspirating both lumens are unsuccessful prior to infusion. This suggests poor flow and inadequate situation for continued dialysis.  SPECIMEN(S):  None  INDICATIONS:   Justin Fitzgerald is a 56 y.o. male who presents with a poorly functioning tunneled dialysis catheter.  DESCRIPTION: After obtaining full informed written consent, the patient was brought to special procedures holding area and positioned supine on a gurney.   Under sterile technique were catheter function is verified. 2 mg of alteplase is reconstituted in 50 cc a total of 2 aliquots are made. Subtotally infusion through both lumens of the dialysis catheter is performed over 2-1/2 hour time period. Infusion is simultaneous.  Following completion of the infusion again using sterile technique both lumens are aspirated and suctioned with flushed with sterile saline. Aspiration demonstrates excellent flow from both lumens. Both lumens are then packed with a total of 5000 units of heparin per lumen reconstituted in the indicated volume based on the patient's catheter. Sterile dressing is reapplied..  COMPLICATIONS: None  CONDITION: Good  Renford Dills M.D. [place] [facility] Office: [phone]   09/12/2016, 3:11 PM

## 2016-09-20 ENCOUNTER — Ambulatory Visit (INDEPENDENT_AMBULATORY_CARE_PROVIDER_SITE_OTHER): Payer: BC Managed Care – PPO

## 2016-09-20 ENCOUNTER — Ambulatory Visit (INDEPENDENT_AMBULATORY_CARE_PROVIDER_SITE_OTHER): Payer: BC Managed Care – PPO | Admitting: Vascular Surgery

## 2016-09-20 ENCOUNTER — Encounter (INDEPENDENT_AMBULATORY_CARE_PROVIDER_SITE_OTHER): Payer: Self-pay | Admitting: Vascular Surgery

## 2016-09-20 ENCOUNTER — Encounter (INDEPENDENT_AMBULATORY_CARE_PROVIDER_SITE_OTHER): Payer: Self-pay

## 2016-09-20 VITALS — BP 115/74 | HR 77 | Resp 16 | Ht 72.0 in | Wt 316.0 lb

## 2016-09-20 DIAGNOSIS — N186 End stage renal disease: Secondary | ICD-10-CM | POA: Diagnosis not present

## 2016-09-20 DIAGNOSIS — T829XXS Unspecified complication of cardiac and vascular prosthetic device, implant and graft, sequela: Secondary | ICD-10-CM

## 2016-09-20 DIAGNOSIS — I639 Cerebral infarction, unspecified: Secondary | ICD-10-CM

## 2016-09-21 ENCOUNTER — Other Ambulatory Visit (INDEPENDENT_AMBULATORY_CARE_PROVIDER_SITE_OTHER): Payer: Self-pay | Admitting: Vascular Surgery

## 2016-09-21 DIAGNOSIS — Z01818 Encounter for other preprocedural examination: Secondary | ICD-10-CM

## 2016-09-25 ENCOUNTER — Encounter
Admission: RE | Admit: 2016-09-25 | Discharge: 2016-09-25 | Disposition: A | Discharge status: Home / Self Care | Payer: Medicare Other | Source: Ambulatory Visit | Attending: Vascular Surgery | Admitting: Vascular Surgery

## 2016-09-25 DIAGNOSIS — Z01812 Encounter for preprocedural laboratory examination: Secondary | ICD-10-CM | POA: Diagnosis not present

## 2016-09-25 DIAGNOSIS — Z01810 Encounter for preprocedural cardiovascular examination: Secondary | ICD-10-CM | POA: Diagnosis present

## 2016-09-25 DIAGNOSIS — R9431 Abnormal electrocardiogram [ECG] [EKG]: Secondary | ICD-10-CM | POA: Insufficient documentation

## 2016-09-25 HISTORY — DX: Heart failure, unspecified: I50.9

## 2016-09-25 LAB — CBC WITH DIFFERENTIAL/PLATELET
BASOS PCT: 1 %
Basophils Absolute: 0.1 10*3/uL (ref 0–0.1)
Eosinophils Absolute: 0.7 10*3/uL (ref 0–0.7)
Eosinophils Relative: 10 %
HEMATOCRIT: 32.7 % — AB (ref 40.0–52.0)
Hemoglobin: 10.8 g/dL — ABNORMAL LOW (ref 13.0–18.0)
Lymphocytes Relative: 14 %
Lymphs Abs: 1 10*3/uL (ref 1.0–3.6)
MCH: 29.5 pg (ref 26.0–34.0)
MCHC: 33 g/dL (ref 32.0–36.0)
MCV: 89.5 fL (ref 80.0–100.0)
MONO ABS: 0.5 10*3/uL (ref 0.2–1.0)
MONOS PCT: 7 %
NEUTROS ABS: 4.9 10*3/uL (ref 1.4–6.5)
Neutrophils Relative %: 68 %
Platelets: 264 10*3/uL (ref 150–440)
RBC: 3.65 MIL/uL — ABNORMAL LOW (ref 4.40–5.90)
RDW: 16.5 % — AB (ref 11.5–14.5)
WBC: 7.2 10*3/uL (ref 3.8–10.6)

## 2016-09-25 LAB — BASIC METABOLIC PANEL
Anion gap: 15 (ref 5–15)
BUN: 41 mg/dL — ABNORMAL HIGH (ref 6–20)
CALCIUM: 8 mg/dL — AB (ref 8.9–10.3)
CO2: 23 mmol/L (ref 22–32)
CREATININE: 9.15 mg/dL — AB (ref 0.61–1.24)
Chloride: 91 mmol/L — ABNORMAL LOW (ref 101–111)
GFR calc Af Amer: 6 mL/min — ABNORMAL LOW (ref >60)
GFR calc non Af Amer: 6 mL/min — ABNORMAL LOW (ref >60)
GLUCOSE: 99 mg/dL (ref 65–99)
Potassium: 4.9 mmol/L (ref 3.5–5.1)
Sodium: 129 mmol/L — ABNORMAL LOW (ref 135–145)

## 2016-09-25 LAB — TYPE AND SCREEN
ABO/RH(D): A POS
Antibody Screen: NEGATIVE

## 2016-09-25 LAB — PROTIME-INR
INR: 0.93
Prothrombin Time: 12.5 seconds (ref 11.4–15.2)

## 2016-09-25 LAB — APTT: aPTT: 31 seconds (ref 24–36)

## 2016-09-25 LAB — SURGICAL PCR SCREEN
MRSA, PCR: NEGATIVE
STAPHYLOCOCCUS AUREUS: NEGATIVE

## 2016-09-25 MED ORDER — CHLORHEXIDINE GLUCONATE CLOTH 2 % EX PADS
6.0000 | MEDICATED_PAD | Freq: Once | CUTANEOUS | Status: DC
  Filled 2016-09-25: qty 6

## 2016-09-25 NOTE — Pre-Procedure Instructions (Signed)
Has perm a cath in Right groin. Has AV fistula in Left arm.

## 2016-09-25 NOTE — Patient Instructions (Signed)
Your procedure is scheduled on: 09/29/16 Fri Report to Same Day Surgery 2nd floor medical mall [facility] Entrance-take elevator on left to 2nd floor.  Check in with surgery information desk.) To find out your arrival time please call [phone] between 1PM - 3PM on 09/28/16 Thurs  Remember: Instructions that are not followed completely may result in serious medical risk, up to and including death, or upon the discretion of your surgeon and anesthesiologist your surgery may need to be rescheduled.    _x___ 1. Do not eat food or drink liquids after midnight. No gum chewing or                              hard candies.     __x__ 2. No Alcohol for 24 hours before or after surgery.   __x__3. No Smoking for 24 prior to surgery.   ____  4. Bring all medications with you on the day of surgery if instructed.    __x__ 5. Notify your doctor if there is any change in your medical condition     (cold, fever, infections).     Do not wear jewelry, make-up, hairpins, clips or nail polish.  Do not wear lotions, powders, or perfumes. You may wear deodorant.  Do not shave 48 hours prior to surgery. Men may shave face and neck.  Do not bring valuables to the hospital.    [facility] is not responsible for any belongings or valuables.               Contacts, dentures or bridgework may not be worn into surgery.  Leave your suitcase in the car. After surgery it may be brought to your room.  For patients admitted to the hospital, discharge time is determined by your                       treatment team.   Patients discharged the day of surgery will not be allowed to drive home.  You will need someone to drive you home and stay with you the night of your procedure.    Please read over the following fact sheets that you were given:   [facility] Preparing for Surgery and or MRSA Information   _x___ Take anti-hypertensive (unless it includes a diuretic), cardiac, seizure, asthma,     anti-reflux and  psychiatric medicines. These include:  1. carvedilol (COREG  2.ezetimibe (ZETIA  3.SPIRIVA HANDIHALER   4.  5.  6.  ____Fleets enema or Magnesium Citrate as directed.   _x___ Use CHG Soap or sage wipes as directed on instruction sheet   __x__ Use inhalers on the day of surgery and bring to hospital day of surgery  ____ Stop Metformin and Janumet 2 days prior to surgery.    ____ Take 1/2 of usual insulin dose the night before surgery and none on the morning     surgery.   _x___ Follow recommendations from Cardiologist, Pulmonologist or PCP regarding          stopping Aspirin, Coumadin, Pllavix ,Eliquis, Effient, or Pradaxa, and Pletal.  X____Stop Anti-inflammatories such as Advil, Aleve, Ibuprofen, Motrin, Naproxen, Naprosyn, Goodies powders or aspirin products. OK to take Tylenol and                          Celebrex.   _x___ Stop supplements until after surgery.  But may continue  Vitamin D, Vitamin B,       and multivitamin.   ____ Bring C-Pap to the hospital.

## 2016-09-26 NOTE — Progress Notes (Signed)
Subjective:    Patient ID: Justin Fitzgerald, male    DOB: [birth year], 58 y.o.   MRN: [id number] Chief Complaint  Patient presents with  . Routine Post Op    3 week follow up   Patient last seen on 09/08/16 during which a permcath was inserted. Patient with a failed PD catheter however at the time, his fistula was not mature (creation on 07/28/16). He presents today to assess the maturity of his fistula and to discuss the removal of his PD catheter and IJ permcath. No issues with permcath.    Review of Systems  Constitutional: Negative.   HENT: Negative.   Eyes: Negative.   Respiratory: Negative.   Cardiovascular: Negative.   Gastrointestinal:       PD catheter  Endocrine: Negative.   Genitourinary:       ESRD  Musculoskeletal: Negative.   Skin: Negative.   Allergic/Immunologic: Negative.   Neurological: Negative.   Hematological: Negative.   Psychiatric/Behavioral: Negative.       Objective:   Physical Exam  Constitutional: He is oriented to person, place, and time. He appears well-developed and well-nourished. No distress.  HENT:  Head: Normocephalic and atraumatic.  Eyes: Conjunctivae are normal. Pupils are equal, round, and reactive to light.  Neck: Normal range of motion.  Cardiovascular: Normal rate, regular rhythm, normal heart sounds and intact distal pulses.   Pulses:      Radial pulses are 2+ on the right side, and 2+ on the left side.  Left Upper Extremity: Good bruit and thrill. Incisions healed.  Right Groin: IJ intact, no infection noted.   Pulmonary/Chest: Effort normal.  Abdominal:  PD catheter intact. No signs of infection.   Musculoskeletal: He exhibits no edema.  Neurological: He is alert and oriented to person, place, and time.  Skin: He is not diaphoretic.  Psychiatric: He has a normal mood and affect. His behavior is normal. Judgment and thought content normal.  Vitals reviewed.  BP 115/74 (BP Location: Right Arm)   Pulse 77   Resp 16    Ht 6' (1.829 m)   Wt (!) 316 lb (143.3 kg)   BMI 42.86 kg/m   Past Medical History:  Diagnosis Date  . CHF (congestive heart failure) (HCC)   . COPD (chronic obstructive pulmonary disease) (HCC)   . Dialysis patient (HCC)    Tues. Thurs. Sat.  [name] Dyspnea    with exertion  . ESRD (end stage renal disease) on dialysis (HCC)    "[facility] [place]" (06/22/2016)  . History of gout   . Hyperlipidemia   . Hypertension   . Peripheral vascular disease (HCC)   . Stroke (HCC) 06/20/2016   denies residual on 06/22/2016  . Type II diabetes mellitus (HCC)    diet control  . Umbilical hernia    Social History   Social History  . Marital status: Married    Spouse name: N/A  . Number of children: N/A  . Years of education: N/A   Occupational History  . Not on file.   Social History Main Topics  . Smoking status: Current Every Day Smoker    Packs/day: 0.50    Years: 40.00    Types: Cigarettes  . Smokeless tobacco: Former User    Types: Chew     Comment: 06/22/2016 "quit chewing when I was 58 or 56 years old"  . Alcohol use No  . Drug use: No  . Sexual activity: Not Currently   Other Topics Concern  .  Not on file   Social History Narrative  . No narrative on file   Past Surgical History:  Procedure Laterality Date  . AV FISTULA PLACEMENT Left 07/28/2016   Procedure: ARTERIOVENOUS (AV) FISTULA CREATION;  Surgeon: Renford Dills, MD;  Location: [facility] ORS;  Service: Vascular;  Laterality: Left;  . CARDIAC CATHETERIZATION    . DIALYSIS/PERMA CATHETER INSERTION N/A 06/28/2016   Procedure: Dialysis/Perma Catheter Insertion;  Surgeon: Renford Dills, MD;  Location: [facility] INVASIVE CV LAB;  Service: Cardiovascular;  Laterality: N/A;  . DIALYSIS/PERMA CATHETER INSERTION N/A 08/25/2016   Procedure: Dialysis/Perma Catheter Insertion;  Surgeon: Annice Needy, MD;  Location: [facility] INVASIVE CV LAB;  Service: Cardiovascular;  Laterality: N/A;  . DIALYSIS/PERMA CATHETER INSERTION N/A 09/08/2016    Procedure: Dialysis/Perma Catheter Insertion;  Surgeon: Renford Dills, MD;  Location: [facility] INVASIVE CV LAB;  Service: Cardiovascular;  Laterality: N/A;  . EXTERIORIZATION OF A CONTINUOUS AMBULATORY PERITONEAL DIALYSIS CATHETER    . FRACTURE SURGERY    . IR GENERIC HISTORICAL  08/07/2016   IR VENIPUNCTURE 77YRS/OLDER BY MD 08/07/2016 Jolaine Click, MD [facility]-INTER[facility]  . IR GENERIC HISTORICAL  08/07/2016   IR US GUIDE VASC ACCESS RIGHT 08/07/2016 Jolaine Click, MD [facility]  . PERIPHERAL VASCULAR CATHETERIZATION N/A 03/08/2016   Procedure: Dialysis/Perma Catheter Insertion;  Surgeon: Renford Dills, MD;  Location: [facility] INVASIVE CV LAB;  Service: Cardiovascular;  Laterality: N/A;  . PERIPHERAL VASCULAR CATHETERIZATION N/A 04/20/2016   Procedure: Dialysis/Perma Catheter Removal;  Surgeon: Annice Needy, MD;  Location: [facility] INVASIVE CV LAB;  Service: Cardiovascular;  Laterality: N/A;  . PERIPHERAL VASCULAR CATHETERIZATION N/A 05/25/2016   Procedure: Dialysis/Perma Catheter Insertion;  Surgeon: Annice Needy, MD;  Location: [facility] INVASIVE CV LAB;  Service: Cardiovascular;  Laterality: N/A;  . RIGHT/LEFT HEART CATH AND CORONARY ANGIOGRAPHY N/A 06/23/2016   Procedure: Right/Left Heart Cath and Coronary Angiography;  Surgeon: [name], MD;  Location: [facility] INVASIVE CV LAB;  Service: Cardiovascular;  Laterality: N/A;  . TEE WITHOUT CARDIOVERSION N/A 06/26/2016   Procedure: TRANSESOPHAGEAL ECHOCARDIOGRAM (TEE);  Surgeon: Pricilla Riffle, MD;  Location: [facility] ENDOSCOPY;  Service: Cardiovascular;  Laterality: N/A;  . WRIST FRACTURE SURGERY Right ~ 2000   "got 8 screws in there"   Family History  Problem Relation Age of Onset  . Hypertension Mother   . CAD Mother    Allergies  Allergen Reactions  . Ramipril Shortness Of Breath  . Aspirin Other (See Comments)    kidney disease      Assessment & Plan:  Patient last seen on 09/08/16 during which a permcath was inserted. Patient with a failed PD catheter however  at the time, his fistula was not mature (creation on 07/28/16). He presents today to assess the maturity of his fistula and to discuss the removal of his PD catheter and IJ permcath. No issues with permcath.   1. End stage renal disease (HCC) - Stable OK to start using access. Once running successfully will plan on removal of right groin IJ permcath. Will plan on removal of non-functioning PD catheter Patient to follow up in six months for HDA follow up.  - VAS US DUPLEX DIALYSIS ACCESS (AVF,AVG); Future  2. Complication from renal dialysis device, sequela - Stable As above  Current Outpatient Prescriptions on File Prior to Visit  Medication Sig Dispense Refill  . aspirin EC 81 MG EC tablet Take 1 tablet (81 mg total) by mouth daily. 90 tablet 3  . atorvastatin (LIPITOR) 80 MG  tablet Take 1 tablet (80 mg total) by mouth daily at 6 PM. 60 tablet 1  . beclomethasone (QVAR) 40 MCG/ACT inhaler Inhale 1 puff into the lungs 2 (two) times daily. (Patient not taking: Reported on 09/25/2016) 1 Inhaler 0  . carvedilol (COREG) 3.125 MG tablet Take 1 tablet (3.125 mg total) by mouth 2 (two) times daily with a meal. 60 tablet 1  . cinacalcet (SENSIPAR) 30 MG tablet Take 4 tablets (120 mg total) by mouth daily with supper. (Patient taking differently: Take 30 mg by mouth daily with supper. ) 120 tablet 0  . cinacalcet (SENSIPAR) 90 MG tablet Take 90 mg by mouth daily with supper.    . clopidogrel (PLAVIX) 75 MG tablet Take 75 mg by mouth daily.    [name] ezetimibe (ZETIA) 10 MG tablet Take 1 tablet (10 mg total) by mouth daily. 90 tablet 3  . folic acid-vitamin b complex-vitamin c-selenium-zinc (DIALYVITE) 3 MG TABS tablet Take 1 tablet by mouth Every Tuesday,Thursday,and Saturday with dialysis.    . furosemide (LASIX) 80 MG tablet Take 80 mg by mouth 2 (two) times daily.     [name] gabapentin (NEURONTIN) 300 MG capsule Take 300 mg by mouth at bedtime.     . sevelamer carbonate (RENVELA) 2.4 g PACK Take 2.4 g by  mouth 3 (three) times daily with meals. (Patient taking differently: Take 2.4 g by mouth daily. WITH A SNACK) 90 each 0  . SPIRIVA HANDIHALER 18 MCG inhalation capsule Place 18 mcg into inhaler and inhale daily.      No current facility-administered medications on file prior to visit.     There are no Patient Instructions on file for this visit. No Follow-up on file.   Leesa Leifheit A Jourden Delmont, PA-C

## 2016-09-28 MED ORDER — CEFAZOLIN SODIUM-DEXTROSE 2-4 GM/100ML-% IV SOLN: 2.0000 g | Freq: Once | INTRAVENOUS | Status: DC

## 2016-09-28 MED ORDER — CEFAZOLIN SODIUM-DEXTROSE 2-4 GM/100ML-% IV SOLN
2.0000 g | INTRAVENOUS | Status: AC
  Administered 2016-09-29: 2 g via INTRAVENOUS

## 2016-09-29 ENCOUNTER — Encounter: Payer: Self-pay | Admitting: *Deleted

## 2016-09-29 ENCOUNTER — Ambulatory Visit: Payer: Medicare Other | Admitting: Certified Registered"

## 2016-09-29 ENCOUNTER — Ambulatory Visit
Admission: RE | Admit: 2016-09-29 | Discharge: 2016-09-29 | Disposition: A | Discharge status: Home / Self Care | Payer: Medicare Other | Source: Ambulatory Visit | Attending: Vascular Surgery | Admitting: Vascular Surgery

## 2016-09-29 ENCOUNTER — Encounter
Admission: RE | Disposition: A | Discharge status: Home / Self Care | Payer: Self-pay | Source: Ambulatory Visit | Attending: Vascular Surgery

## 2016-09-29 DIAGNOSIS — F17210 Nicotine dependence, cigarettes, uncomplicated: Secondary | ICD-10-CM | POA: Diagnosis not present

## 2016-09-29 DIAGNOSIS — Y841 Kidney dialysis as the cause of abnormal reaction of the patient, or of later complication, without mention of misadventure at the time of the procedure: Secondary | ICD-10-CM | POA: Insufficient documentation

## 2016-09-29 DIAGNOSIS — T85611A Breakdown (mechanical) of intraperitoneal dialysis catheter, initial encounter: Secondary | ICD-10-CM | POA: Diagnosis not present

## 2016-09-29 DIAGNOSIS — E1122 Type 2 diabetes mellitus with diabetic chronic kidney disease: Secondary | ICD-10-CM | POA: Diagnosis not present

## 2016-09-29 DIAGNOSIS — N186 End stage renal disease: Secondary | ICD-10-CM | POA: Insufficient documentation

## 2016-09-29 DIAGNOSIS — I132 Hypertensive heart and chronic kidney disease with heart failure and with stage 5 chronic kidney disease, or end stage renal disease: Secondary | ICD-10-CM | POA: Insufficient documentation

## 2016-09-29 DIAGNOSIS — Z7982 Long term (current) use of aspirin: Secondary | ICD-10-CM | POA: Diagnosis not present

## 2016-09-29 DIAGNOSIS — Z992 Dependence on renal dialysis: Secondary | ICD-10-CM | POA: Insufficient documentation

## 2016-09-29 DIAGNOSIS — I509 Heart failure, unspecified: Secondary | ICD-10-CM | POA: Insufficient documentation

## 2016-09-29 DIAGNOSIS — Z79899 Other long term (current) drug therapy: Secondary | ICD-10-CM | POA: Diagnosis not present

## 2016-09-29 DIAGNOSIS — J449 Chronic obstructive pulmonary disease, unspecified: Secondary | ICD-10-CM | POA: Insufficient documentation

## 2016-09-29 DIAGNOSIS — Y929 Unspecified place or not applicable: Secondary | ICD-10-CM | POA: Diagnosis not present

## 2016-09-29 DIAGNOSIS — E785 Hyperlipidemia, unspecified: Secondary | ICD-10-CM | POA: Insufficient documentation

## 2016-09-29 HISTORY — PX: REMOVAL OF A DIALYSIS CATHETER: SHX6053

## 2016-09-29 LAB — GLUCOSE, CAPILLARY
GLUCOSE-CAPILLARY: 91 mg/dL (ref 65–99)
Glucose-Capillary: 75 mg/dL (ref 65–99)

## 2016-09-29 LAB — POCT I-STAT 4, (NA,K, GLUC, HGB,HCT)
Glucose, Bld: 89 mg/dL (ref 65–99)
HEMATOCRIT: 37 % — AB (ref 39.0–52.0)
Hemoglobin: 12.6 g/dL — ABNORMAL LOW (ref 13.0–17.0)
Potassium: 4.1 mmol/L (ref 3.5–5.1)
SODIUM: 134 mmol/L — AB (ref 135–145)

## 2016-09-29 SURGERY — REMOVAL, DIALYSIS CATHETER
Anesthesia: General | Wound class: Clean

## 2016-09-29 MED ORDER — HYDROCODONE-ACETAMINOPHEN 5-325 MG PO TABS: 1.0000 | ORAL_TABLET | Freq: Four times a day (QID) | ORAL | 0 refills | Status: DC | PRN

## 2016-09-29 MED ORDER — PROPOFOL 10 MG/ML IV BOLUS
INTRAVENOUS | Status: DC | PRN
  Administered 2016-09-29: 50 mg via INTRAVENOUS
  Administered 2016-09-29: 150 mg via INTRAVENOUS

## 2016-09-29 MED ORDER — FAMOTIDINE 20 MG PO TABS
20.0000 mg | ORAL_TABLET | Freq: Once | ORAL | Status: AC
  Administered 2016-09-29: 20 mg via ORAL

## 2016-09-29 MED ORDER — FENTANYL CITRATE (PF) 100 MCG/2ML IJ SOLN
INTRAMUSCULAR | Status: AC
  Filled 2016-09-29: qty 2

## 2016-09-29 MED ORDER — CEFAZOLIN SODIUM-DEXTROSE 2-4 GM/100ML-% IV SOLN
INTRAVENOUS | Status: AC
  Filled 2016-09-29: qty 100

## 2016-09-29 MED ORDER — FENTANYL CITRATE (PF) 100 MCG/2ML IJ SOLN
INTRAMUSCULAR | Status: DC | PRN
  Administered 2016-09-29: 50 ug via INTRAVENOUS

## 2016-09-29 MED ORDER — PROMETHAZINE HCL 25 MG/ML IJ SOLN: 6.2500 mg | INTRAMUSCULAR | Status: DC | PRN

## 2016-09-29 MED ORDER — PHENYLEPHRINE HCL 10 MG/ML IJ SOLN
INTRAMUSCULAR | Status: DC | PRN
  Administered 2016-09-29 (×2): 100 ug via INTRAVENOUS

## 2016-09-29 MED ORDER — BACITRACIN 500 UNIT/GM EX OINT
TOPICAL_OINTMENT | CUTANEOUS | Status: DC | PRN
  Administered 2016-09-29: 1 via TOPICAL

## 2016-09-29 MED ORDER — PHENYLEPHRINE HCL 10 MG/ML IJ SOLN
INTRAMUSCULAR | Status: AC
  Filled 2016-09-29: qty 1

## 2016-09-29 MED ORDER — ONDANSETRON HCL 4 MG/2ML IJ SOLN
INTRAMUSCULAR | Status: AC
  Filled 2016-09-29: qty 2

## 2016-09-29 MED ORDER — BUPIVACAINE HCL 0.5 % IJ SOLN
INTRAMUSCULAR | Status: DC | PRN
  Administered 2016-09-29: 10 mL

## 2016-09-29 MED ORDER — PROPOFOL 10 MG/ML IV BOLUS
INTRAVENOUS | Status: AC
  Filled 2016-09-29: qty 20

## 2016-09-29 MED ORDER — BACITRACIN ZINC 500 UNIT/GM EX OINT
TOPICAL_OINTMENT | CUTANEOUS | Status: AC
  Filled 2016-09-29: qty 28.35

## 2016-09-29 MED ORDER — FENTANYL CITRATE (PF) 100 MCG/2ML IJ SOLN: 25.0000 ug | INTRAMUSCULAR | Status: DC | PRN

## 2016-09-29 MED ORDER — SODIUM CHLORIDE 0.9 % IV SOLN
INTRAVENOUS | Status: DC
  Administered 2016-09-29: 07:00:00 via INTRAVENOUS

## 2016-09-29 MED ORDER — FAMOTIDINE 20 MG PO TABS
ORAL_TABLET | ORAL | Status: AC
  Administered 2016-09-29: 20 mg via ORAL
  Filled 2016-09-29: qty 1

## 2016-09-29 MED ORDER — SODIUM CHLORIDE 0.9 % IV SOLN
INTRAVENOUS | Status: DC | PRN
  Administered 2016-09-29: 40 ug/min via INTRAVENOUS

## 2016-09-29 MED ORDER — ONDANSETRON HCL 4 MG/2ML IJ SOLN
INTRAMUSCULAR | Status: DC | PRN
  Administered 2016-09-29: 4 mg via INTRAVENOUS

## 2016-09-29 MED ORDER — BUPIVACAINE HCL (PF) 0.5 % IJ SOLN
INTRAMUSCULAR | Status: AC
  Filled 2016-09-29: qty 30

## 2016-09-29 SURGICAL SUPPLY — 26 items
BLADE SURG 15 STRL LF DISP TIS (BLADE) ×1 IMPLANT
BLADE SURG 15 STRL SS (BLADE) ×2
CANISTER SUCT 1200ML W/VALVE (MISCELLANEOUS) ×3 IMPLANT
CHLORAPREP W/TINT 26ML (MISCELLANEOUS) ×3 IMPLANT
DERMABOND ADVANCED (GAUZE/BANDAGES/DRESSINGS) ×2
DERMABOND ADVANCED .7 DNX12 (GAUZE/BANDAGES/DRESSINGS) ×1 IMPLANT
DRAPE LAPAROTOMY 100X77 ABD (DRAPES) ×3 IMPLANT
ELECT REM PT RETURN 9FT ADLT (ELECTROSURGICAL) ×3
ELECTRODE REM PT RTRN 9FT ADLT (ELECTROSURGICAL) ×1 IMPLANT
GAUZE STRETCH 2X75IN STRL (MISCELLANEOUS) ×6 IMPLANT
GLOVE SURG SYN 8.0 (GLOVE) ×3 IMPLANT
GOWN STRL REUS W/ TWL LRG LVL3 (GOWN DISPOSABLE) ×1 IMPLANT
GOWN STRL REUS W/ TWL XL LVL3 (GOWN DISPOSABLE) ×1 IMPLANT
GOWN STRL REUS W/TWL LRG LVL3 (GOWN DISPOSABLE) ×2
GOWN STRL REUS W/TWL XL LVL3 (GOWN DISPOSABLE) ×2
KIT RM TURNOVER STRD PROC AR (KITS) ×3 IMPLANT
LABEL OR SOLS (LABEL) ×3 IMPLANT
NEEDLE HYPO 25X1 1.5 SAFETY (NEEDLE) ×3 IMPLANT
NS IRRIG 500ML POUR BTL (IV SOLUTION) ×3 IMPLANT
PACK BASIN MINOR ARMC (MISCELLANEOUS) ×3 IMPLANT
SUT MNCRL+ 5-0 UNDYED PC-3 (SUTURE) ×1 IMPLANT
SUT MONOCRYL 5-0 (SUTURE) ×2
SUT VIC AB 0 CT2 27 (SUTURE) ×3 IMPLANT
SUT VIC AB 3-0 SH 27 (SUTURE) ×2
SUT VIC AB 3-0 SH 27X BRD (SUTURE) ×1 IMPLANT
SYRINGE 10CC LL (SYRINGE) ×3 IMPLANT

## 2016-09-29 NOTE — Anesthesia Procedure Notes (Signed)
Procedure Name: LMA Insertion Performed by: Casey Burkitt Pre-anesthesia Checklist: Patient identified, Patient being monitored, Timeout performed, Emergency Drugs available and Suction available Patient Re-evaluated:Patient Re-evaluated prior to inductionOxygen Delivery Method: Circle system utilized Preoxygenation: Pre-oxygenation with 100% oxygen Intubation Type: IV induction Ventilation: Mask ventilation without difficulty LMA: LMA inserted LMA Size: 5.0 Tube type: Oral Number of attempts: 2 Placement Confirmation: positive ETCO2 and breath sounds checked- equal and bilateral Tube secured with: Tape Dental Injury: Teeth and Oropharynx as per pre-operative assessment  Comments: Attempted first with 4.5 LMA, had moderate leak. Removed LMA and replaced with #5 LMA, no significant leak noted. + ETCO2, +BBS

## 2016-09-29 NOTE — H&P (Signed)
[facility] History & Physical Update  The patient was interviewed and re-examined.  The patient's previous History and Physical has been reviewed and is unchanged.  There is no change in the plan of care. We plan to proceed with the scheduled procedure.  Levora Dredge, MD  09/29/2016, 7:37 AM

## 2016-09-29 NOTE — OR Nursing (Signed)
Called Dr Gilda Crease to clarify dressing instructions.  Per Dr Gilda Crease change dressing daily and use neosporin on incision.

## 2016-09-29 NOTE — Anesthesia Post-op Follow-up Note (Cosign Needed)
Anesthesia QCDR form completed.        

## 2016-09-29 NOTE — Op Note (Signed)
  OPERATIVE NOTE   PROCEDURE: 1. Removal of a Peritoneal Dialysis Catheter  PRE-OPERATIVE DIAGNOSIS: Complication of dialysis catheter with poor dialysis by peritoneal method,  End Stage Renal Disease  POST-OPERATIVE DIAGNOSIS: Same  SURGEON: Levora Dredge  ANESTHESIA: General with Local anesthetic    ESTIMATED BLOOD LOSS: Minimal   FINDING(S): 1. Catheter intact   SPECIMEN(S):  Catheter  INDICATIONS:   Justin Fitzgerald is a 56 y.o. male who presents with nonfunctioning PD catheter.  He has now transitioned to HD.  DESCRIPTION: After obtaining full informed written consent, the patient was positioned supine. The peritoneal dialysis catheter and surrounding area is prepped and draped in a sterile fashion. The cuff was localized.  After appropriate timeout is called, local anesthetic with epinephrine is infiltrated into the surrounding tissues around the cuff in the left lower quadrant. Previous incision is open with an 15 blade scalpel and the dissection was carried down to expose the cuff of the tunneled catheter.  The catheter is then freed from the surrounding attachments and adhesions. Once the catheter has been freed circumferentially a pursestring suture of 0 Vicryl is placed within the anterior rectus sheath. The catheter is then removed from the peritoneal cavity and the pursestring suture secured. The second cuff is then localized and dissected free. The catheter is transected just distal to the cuff and subsequently removed in 2 pieces. The lower quadrant incision is then irrigated and closed in layers with Vicryl.. A 4-0 Monocryl was used close the skin. Dermabond is applied to the incision.   Antibiotic ointment and a sterile dressing is applied to the exit site. Patient tolerated procedure well and there were no complications.  COMPLICATIONS: None  CONDITION: Unchanged  Levora Dredge. [facility] Office: [phone]  09/29/2016,9:21 AM

## 2016-09-29 NOTE — Transfer of Care (Signed)
Immediate Anesthesia Transfer of Care Note  Patient: Justin Fitzgerald  Procedure(s) Performed: Procedure(s): REMOVAL OF A DIALYSIS CATHETER ( PERITONEAL DIALYSIS CATH ) (N/A)  Patient Location: PACU  Anesthesia Type:General  Level of Consciousness: awake and responds to stimulation  Airway & Oxygen Therapy: Patient Spontanous Breathing and Patient connected to face mask oxygen  Post-op Assessment: Report given to RN and Post -op Vital signs reviewed and stable  Post vital signs: Reviewed and stable  Last Vitals:  Vitals:   09/29/16 0615 09/29/16 0915  BP: 106/62 98/69  Pulse: 82 78  Resp:  16  Temp: 36.7 C     Last Pain:  Vitals:   09/29/16 0612  TempSrc: Tympanic         Complications: No apparent anesthesia complications

## 2016-09-29 NOTE — Discharge Instructions (Signed)
AMBULATORY SURGERY  DISCHARGE INSTRUCTIONS   1) The drugs that you were given will stay in your system until tomorrow so for the next 24 hours you should not:  A) Drive an automobile B) Make any legal decisions C) Drink any alcoholic beverage   2) You may resume regular meals tomorrow.  Today it is better to start with liquids and gradually work up to solid foods.  You may eat anything you prefer, but it is better to start with liquids, then soup and crackers, and gradually work up to solid foods.   3) Please notify your doctor immediately if you have any unusual bleeding, trouble breathing, redness and pain at the surgery site, drainage, fever, or pain not relieved by medication.    4) Additional Instructions: Change dressing daily and use neosporin on incision.        Please contact your physician with any problems or Same Day Surgery at [phone], Monday through Friday 6 am to 4 pm, or [place] at [facility] number at [phone].

## 2016-09-29 NOTE — Anesthesia Preprocedure Evaluation (Signed)
Anesthesia Evaluation  Patient identified by MRN, date of birth, ID band Patient awake    Reviewed: Allergy & Precautions, H&P , NPO status , Patient's Chart, lab work & pertinent test results, reviewed documented beta blocker date and time   History of Anesthesia Complications Negative for: history of anesthetic complications  Airway Mallampati: III  TM Distance: >3 FB Neck ROM: full    Dental  (+) Poor Dentition, Dental Advidsory Given   Pulmonary shortness of breath and with exertion, COPD,  COPD inhaler, neg recent URI, Current Smoker,           Cardiovascular Exercise Tolerance: Good hypertension, (-) angina+ Peripheral Vascular Disease and +CHF  (-) CAD, (-) Past MI, (-) Cardiac Stents and (-) CABG (-) dysrhythmias (-) Valvular Problems/Murmurs     Neuro/Psych neg Seizures CVA, Residual Symptoms negative psych ROS   GI/Hepatic negative GI ROS, Neg liver ROS,   Endo/Other  diabetesMorbid obesity  Renal/GU ESRF and DialysisRenal disease  negative genitourinary   Musculoskeletal   Abdominal   Peds  Hematology negative hematology ROS (+)   Anesthesia Other Findings Past Medical History: No date: CHF (congestive heart failure) (HCC) No date: COPD (chronic obstructive pulmonary disease) (* No date: Dialysis patient (HCC)     Comment: Tues. Thurs. Sat. No date: Dyspnea     Comment: with exertion No date: ESRD (end stage renal disease) on dialysis (HC*     Comment: "TTS; [name], [place]" (06/22/2016) No date: History of gout No date: Hyperlipidemia No date: Hypertension No date: Peripheral vascular disease (HCC) 06/20/2016: Stroke (HCC)     Comment: denies residual on 06/22/2016 No date: Type II diabetes mellitus (HCC)     Comment: diet control No date: Umbilical hernia   Reproductive/Obstetrics negative OB ROS                             Anesthesia Physical Anesthesia Plan  ASA:  IV  Anesthesia Plan: General   Post-op Pain Management:    Induction:   Airway Management Planned:   Additional Equipment:   Intra-op Plan:   Post-operative Plan:   Informed Consent: I have reviewed the patients History and Physical, chart, labs and discussed the procedure including the risks, benefits and alternatives for the proposed anesthesia with the patient or authorized representative who has indicated his/her understanding and acceptance.   Dental Advisory Given  Plan Discussed with: Anesthesiologist, CRNA and Surgeon  Anesthesia Plan Comments:         Anesthesia Quick Evaluation

## 2016-09-29 NOTE — Anesthesia Postprocedure Evaluation (Signed)
Anesthesia Post Note  Patient: Jayanth Tyburski  Procedure(s) Performed: Procedure(s) (LRB): REMOVAL OF A DIALYSIS CATHETER ( PERITONEAL DIALYSIS CATH ) (N/A)  Patient location during evaluation: PACU Anesthesia Type: General Level of consciousness: awake and alert Pain management: pain level controlled Vital Signs Assessment: post-procedure vital signs reviewed and stable Respiratory status: spontaneous breathing, nonlabored ventilation, respiratory function stable and patient connected to nasal cannula oxygen Cardiovascular status: blood pressure returned to baseline and stable Postop Assessment: no signs of nausea or vomiting Anesthetic complications: no     Last Vitals:  Vitals:   09/29/16 1002 09/29/16 1026  BP: 111/69 108/64  Pulse: 74 71  Resp: 16 16  Temp: 36.2 C 36.4 C    Last Pain:  Vitals:   09/29/16 1026  TempSrc: Temporal  PainSc:                  Lenard Simmer

## 2016-10-12 ENCOUNTER — Encounter (INDEPENDENT_AMBULATORY_CARE_PROVIDER_SITE_OTHER): Payer: Medicare Other | Admitting: Vascular Surgery

## 2016-10-16 ENCOUNTER — Encounter (INDEPENDENT_AMBULATORY_CARE_PROVIDER_SITE_OTHER): Payer: Self-pay | Admitting: Vascular Surgery

## 2016-10-16 ENCOUNTER — Ambulatory Visit (INDEPENDENT_AMBULATORY_CARE_PROVIDER_SITE_OTHER): Payer: BC Managed Care – PPO | Admitting: Vascular Surgery

## 2016-10-16 VITALS — BP 91/63 | HR 76 | Resp 16 | Ht 72.0 in | Wt 315.0 lb

## 2016-10-16 DIAGNOSIS — N186 End stage renal disease: Secondary | ICD-10-CM

## 2016-10-16 DIAGNOSIS — T829XXS Unspecified complication of cardiac and vascular prosthetic device, implant and graft, sequela: Secondary | ICD-10-CM

## 2016-10-16 NOTE — Progress Notes (Signed)
Subjective:    Patient ID: Justin Fitzgerald, male    DOB: [birth year], 56 y.o.   MRN: [id number] Chief Complaint  Patient presents with  . Routine Post Op    2 week post op   He presents for his first postoperative follow-up. He is status post a peritoneal dialysis catheter removal on 09/29/2016. His postoperative course has been unremarkable. He has started using his left upper extremity hemodialysis access. He still has his right groin PermCath. Both of these accesses seem to be working well. He denies any fever nausea vomiting.     Review of Systems  Constitutional: Negative.   HENT: Negative.   Eyes: Negative.   Respiratory: Negative.   Cardiovascular: Negative.   Gastrointestinal: Negative.   Endocrine: Negative.   Genitourinary:       ESRD  Musculoskeletal: Negative.   Skin: Negative.   Allergic/Immunologic: Negative.   Neurological: Negative.   Hematological: Negative.   Psychiatric/Behavioral: Negative.       Objective:   Physical Exam  Constitutional: He is oriented to person, place, and time. He appears well-developed and well-nourished. No distress.  HENT:  Head: Normocephalic and atraumatic.  Eyes: Conjunctivae are normal. Pupils are equal, round, and reactive to light.  Neck: Normal range of motion.  Cardiovascular: Normal rate, regular rhythm and normal heart sounds.   Pulses:      Radial pulses are 2+ on the right side, and 2+ on the left side.  Left upper extremity dialysis access: Healed. Good bruit and thrill.  Pulmonary/Chest: Effort normal.  Musculoskeletal: Normal range of motion. He exhibits no edema.  Neurological: He is alert and oriented to person, place, and time.  Skin: Skin is warm and dry. He is not diaphoretic.  Psychiatric: He has a normal mood and affect. His behavior is normal. Judgment and thought content normal.  Vitals reviewed.  BP 91/63 (BP Location: Right Arm)   Pulse 76   Resp 16   Ht 6' (1.829 m)   Wt (!) 315 lb (142.9  kg)   BMI 42.72 kg/m   Past Medical History:  Diagnosis Date  . CHF (congestive heart failure) (HCC)   . COPD (chronic obstructive pulmonary disease) (HCC)   . Dialysis patient (HCC)    Tues. Thurs. Sat.  [name] Dyspnea    with exertion  . ESRD (end stage renal disease) on dialysis (HCC)    "[facility]; [facility], " (06/22/2016)  . History of gout   . Hyperlipidemia   . Hypertension   . Peripheral vascular disease (HCC)   . Stroke (HCC) 06/20/2016   denies residual on 06/22/2016  . Type II diabetes mellitus (HCC)    diet control  . Umbilical hernia     Social History   Social History  . Marital status: Married    Spouse name: N/A  . Number of children: N/A  . Years of education: N/A   Occupational History  . Not on file.   Social History Main Topics  . Smoking status: Current Every Day Smoker    Packs/day: 0.50    Years: 40.00    Types: Cigarettes  . Smokeless tobacco: Former User    Types: Chew     Comment: 06/22/2016 "quit chewing when I was 6 or 56 years old"  . Alcohol use No  . Drug use: No  . Sexual activity: Not Currently   Other Topics Concern  . Not on file   Social History Narrative  . No narrative on file  Past Surgical History:  Procedure Laterality Date  . AV FISTULA PLACEMENT Left 07/28/2016   Procedure: ARTERIOVENOUS (AV) FISTULA CREATION;  Surgeon: Renford Dills, MD;  Location: [facility] ORS;  Service: Vascular;  Laterality: Left;  . CARDIAC CATHETERIZATION    . DIALYSIS/PERMA CATHETER INSERTION N/A 06/28/2016   Procedure: Dialysis/Perma Catheter Insertion;  Surgeon: Renford Dills, MD;  Location: [facility] INVASIVE CV LAB;  Service: Cardiovascular;  Laterality: N/A;  . DIALYSIS/PERMA CATHETER INSERTION N/A 08/25/2016   Procedure: Dialysis/Perma Catheter Insertion;  Surgeon: Annice Needy, MD;  Location: [facility] INVASIVE CV LAB;  Service: Cardiovascular;  Laterality: N/A;  . DIALYSIS/PERMA CATHETER INSERTION N/A 09/08/2016   Procedure: Dialysis/Perma  Catheter Insertion;  Surgeon: Renford Dills, MD;  Location: [facility] INVASIVE CV LAB;  Service: Cardiovascular;  Laterality: N/A;  . EXTERIORIZATION OF A CONTINUOUS AMBULATORY PERITONEAL DIALYSIS CATHETER    . FRACTURE SURGERY    . IR GENERIC HISTORICAL  08/07/2016   IR VENIPUNCTURE 56YRS/OLDER BY MD 08/07/2016 Jolaine Click, MD [facility]  . IR GENERIC HISTORICAL  08/07/2016   IR US GUIDE VASC ACCESS RIGHT 08/07/2016 Jolaine Click, MD [facility]  . PERIPHERAL VASCULAR CATHETERIZATION N/A 03/08/2016   Procedure: Dialysis/Perma Catheter Insertion;  Surgeon: Renford Dills, MD;  Location: [facility] INV[facility] CV LAB;  Service: Cardiovascular;  Laterality: N/A;  . PERIPHERAL VASCULAR CATHETERIZATION N/A 04/20/2016   Procedure: Dialysis/Perma Catheter Removal;  Surgeon: Annice Needy, MD;  Location: [facility] INV[facility]IVE CV LAB;  Service: Cardiovascular;  Laterality: N/A;  . PERIPHERAL VASCULAR CATHETERIZATION N/A 05/25/2016   Procedure: Dialysis/Perma Catheter Insertion;  Surgeon: Annice Needy, MD;  Location: [facility] CV LAB;  Service: Cardiovascular;  Laterality: N/A;  . REMOVAL OF A DIALYSIS CATHETER N/A 09/29/2016   Procedure: REMOVAL OF A DIALYSIS CATHETER ( PERITONEAL DIALYSIS CATH );  Surgeon: Renford Dills, MD;  Location: [facility] OR[facility];  Service: Vascular;  Laterality: N/A;  . RIGHT/LEFT HEART CATH AND CORONARY ANGIOGRAPHY N/A 06/23/2016   Procedure: Right/Left Heart Cath and Coronary Angiography;  Surgeon: [name], MD;  Location: [facility] INVASIVE CV LAB;  Service: Cardiovascular;  Laterality: N/A;  . TEE WITHOUT CARDIOVERSION N/A 06/26/2016   Procedure: TRANSESOPHAGEAL ECHOCARDIOGRAM (TEE);  Surgeon: Pricilla Riffle, MD;  Location: [facility];  Service: Cardiovascular;  Laterality: N/A;  . WRIST FRACTURE SURGERY Right ~ 2000   "got 8 screws in there"    Family History  Problem Relation Age of Onset  . Hypertension Mother   . CAD Mother     Allergies  Allergen Reactions  . Ramipril Shortness Of  Breath  . Aspirin Other (See Comments)    kidney disease       Assessment & Plan:  He presents for his first postoperative follow-up. He is status post a peritoneal dialysis catheter removal on 09/29/2016. His postoperative course has been unremarkable. He has started using his left upper extremity hemodialysis access. He still has his right groin PermCath. Both of these accesses seem to be working well. He denies any fever nausea vomiting.    1. Complication from renal dialysis device, sequela - improved Peritoneal dialysis catheter is out.  Patient's left upper extremity access seems to be working as per patient. After 3 successful runs through the patient's access we'll plan on removing his right PermCath.  2. End stage renal disease (HCC) - stable As above  Current Outpatient Prescriptions on File Prior to Visit  Medication Sig Dispense Refill  . acetaminophen (TYLENOL) 500 MG tablet Take 1,000 mg by  mouth every 6 (six) hours as needed for mild pain or headache.    [name] aspirin EC 81 MG EC tablet Take 1 tablet (81 mg total) by mouth daily. 90 tablet 3  . atorvastatin (LIPITOR) 80 MG tablet Take 1 tablet (80 mg total) by mouth daily at 6 PM. 60 tablet 1  . beclomethasone (QVAR) 40 MCG/ACT inhaler Inhale 1 puff into the lungs 2 (two) times daily. 1 Inhaler 0  . carvedilol (COREG) 3.125 MG tablet Take 1 tablet (3.125 mg total) by mouth 2 (two) times daily with a meal. 60 tablet 1  . cinacalcet (SENSIPAR) 30 MG tablet Take 4 tablets (120 mg total) by mouth daily with supper. (Patient taking differently: Take 30 mg by mouth daily with supper. ) 120 tablet 0  . cinacalcet (SENSIPAR) 90 MG tablet Take 90 mg by mouth daily with supper.    . clopidogrel (PLAVIX) 75 MG tablet Take 75 mg by mouth daily.    [name] doxycycline (VIBRA-TABS) 100 MG tablet     . ezetimibe (ZETIA) 10 MG tablet Take 1 tablet (10 mg total) by mouth daily. 90 tablet 3  . folic acid-vitamin b complex-vitamin c-selenium-zinc  (DIALYVITE) 3 MG TABS tablet Take 1 tablet by mouth Every Tuesday,Thursday,and Saturday with dialysis.    . furosemide (LASIX) 80 MG tablet Take 80 mg by mouth 2 (two) times daily.     [name] gabapentin (NEURONTIN) 300 MG capsule Take 300 mg by mouth at bedtime.     . sevelamer carbonate (RENVELA) 2.4 g PACK Take 2.4 g by mouth 3 (three) times daily with meals. (Patient taking differently: Take 2.4 g by mouth daily. WITH A SNACK) 90 each 0  . SPIRIVA HANDIHALER 18 MCG inhalation capsule Place 18 mcg into inhaler and inhale daily.     [name] HYDROcodone-acetaminophen (NORCO) 5-325 MG tablet Take 1-2 tablets by mouth every 6 (six) hours as needed for moderate pain or severe pain. (Patient not taking: Reported on 10/16/2016) 50 tablet 0   No current facility-administered medications on file prior to visit.     There are no Patient Instructions on file for this visit. No Follow-up on file.   KIMBERLY A STEGMAYER, PA-C

## 2016-10-30 ENCOUNTER — Ambulatory Visit: Payer: Self-pay | Admitting: Cardiovascular Disease

## 2016-10-30 NOTE — Progress Notes (Deleted)
Cardiology Office Note  Date:  10/30/2016   ID:  Justin Fitzgerald, DOB Jul 06, 1960, MRN [id number]  PCP:  Dorothey Baseman, MD   No chief complaint on file.   HPI:  Justin Fitzgerald is a 56 y.o. male with history of  ESRD on PD for at least 9 years recently started with HD,  Remote peritonitis infection, in hospital on ABX chronic combined systolic and diastolic heart failure with EF of 20%, admission in early 06/2016,  TEE With EF 35%, Nonischemic cardiomyopathy February 2018 hypertension,  PVD,  diabetes mellitus,  Still smoking, started young recent stroke in 2/18  who presents for follow-up of his stroke, chronic diastolic and systolic CHF   Recent hospitalization at [facility] for stroke February 2018 transesophageal echo/TEE  ejection fraction 35% "walled off region next to aorta between left and noncoronary cusps of aortic valve that probably represents remote abscess. There does not appear to be a communication with the aorta. "  He had follow-up CT cardiac study Poor venous access, inadequate opacification of aortic root Moderately calcified aortic atherosclerosis 12 x 19 mm cavity behind non coronary cusp/ and [place]/LC fissure that may represent a sinus of Valsalva aneurysm with possible contained rupture vs wall off abscess.  Recommendation was for revisualization with TEE at 3 months  Cardiac catheterization study reviewed Heavily calcified coronary arteries with chronic occlusion of the first OM branch of the circumflex, moderate diffuse RCA stenosis, and mild nonobstructive LAD stenosis Normal right heart hemodynamics and normal LVEDP Known severe LV systolic dysfunction Medical management was recommended  Lab work reviewed previously by myself HBA1C 5.6 Total chol 170,  LDL 98  Reports that he feels well, occasional shortness of breath HD every Tuesday, Thursday, and Saturday Continues to smoke Takes Lasix 160 mg twice a day Does not weigh  himself  Other past medical history reviewed  admitted to [facility] in late December 2017 for acute hypoxic respiratory failure requiring oxygen supplementation in the setting of COPD exacerbation. treated with IV steroids and nebulizers and antibiotics. complicated with peritonitis requiring antibiotic therapy.  mildly elevated troponin  felt to be secondary to demand ischemia    admitted June 21 2016 with stroke after developing left facial droop and slurring of his speech   MRI brain demonstrated right MCA territory infarct felt to be embolic  Lower extremity dopplers were negative for DVT.  Echo showed newly diagnosed systolic heart failure with EF of 20-25%, diffuse hypokinesis, G1DD, mild aortic stenosis, aortic root mildly dilated, mildly to moderately calcified mitral annulus, left atrium moderately dilated.    30 day event monitor   asymptomatic NSVT while in the hospital, beta blocker was titrated as tolerated.   Right and left heart catheterization to 06/23/2016: Mid LAD 40% stenosed with calcified lesion, diffuse calcific LAD stenosis, nonobstructive. OM1 CTO. Proximal RCA 60% stenosed, mid to distal RCA 50% stenosed.     PMH:   has a past medical history of CHF (congestive heart failure) (HCC); COPD (chronic obstructive pulmonary disease) (HCC); Dialysis patient [facility]); Dyspnea; ESRD (end stage renal disease) on dialysis (HCC); History of gout; Hyperlipidemia; Hypertension; Peripheral vascular disease (HCC); Stroke [facility]) (06/20/2016); Type II diabetes mellitus (HCC); and Umbilical hernia.  PSH:    Past Surgical History:  Procedure Laterality Date  . AV FISTULA PLACEMENT Left 07/28/2016   Procedure: ARTERIOVENOUS (AV) FISTULA CREATION;  Surgeon: Renford Dills, MD;  Location: [facility] ORS;  Service: Vascular;  Laterality: Left;  . CARDIAC CATHETERIZATION    .  DIALYSIS/PERMA CATHETER INSERTION N/A 06/28/2016   Procedure: Dialysis/Perma Catheter Insertion;  Surgeon: Renford Dills,  MD;  Location: [facility] INVASIVE CV LAB;  Service: Cardiovascular;  Laterality: N/A;  . DIALYSIS/PERMA CATHETER INSERTION N/A 08/25/2016   Procedure: Dialysis/Perma Catheter Insertion;  Surgeon: Annice Needy, MD;  Location: [facility] INVASIVE CV LAB;  Service: Cardiovascular;  Laterality: N/A;  . DIALYSIS/PERMA CATHETER INSERTION N/A 09/08/2016   Procedure: Dialysis/Perma Catheter Insertion;  Surgeon: Renford Dills, MD;  Location: [facility] INVASIVE CV LAB;  Service: Cardiovascular;  Laterality: N/A;  . EXTERIORIZATION OF A CONTINUOUS AMBULATORY PERITONEAL DIALYSIS CATHETER    . FRACTURE SURGERY    . IR GENERIC HISTORICAL  08/07/2016   IR VENIPUNCTURE 43YRS/OLDER BY MD 08/07/2016 Jolaine Click, MD [facility]  . IR GENERIC HISTORICAL  08/07/2016   IR US GUIDE VASC ACCESS RIGHT 08/07/2016 Jolaine Click, MD [facility]  . PERIPHERAL VASCULAR CATHETERIZATION N/A 03/08/2016   Procedure: Dialysis/Perma Catheter Insertion;  Surgeon: Renford Dills, MD;  Location: [facility] INVASIVE CV LAB;  Service: Cardiovascular;  Laterality: N/A;  . PERIPHERAL VASCULAR CATHETERIZATION N/A 04/20/2016   Procedure: Dialysis/Perma Catheter Removal;  Surgeon: Annice Needy, MD;  Location: [facility] INVASIVE CV LAB;  Service: Cardiovascular;  Laterality: N/A;  . PERIPHERAL VASCULAR CATHETERIZATION N/A 05/25/2016   Procedure: Dialysis/Perma Catheter Insertion;  Surgeon: Annice Needy, MD;  Location: [facility] INVASIVE CV LAB;  Service: Cardiovascular;  Laterality: N/A;  . REMOVAL OF A DIALYSIS CATHETER N/A 09/29/2016   Procedure: REMOVAL OF A DIALYSIS CATHETER ( PERITONEAL DIALYSIS CATH );  Surgeon: Renford Dills, MD;  Location: [facility] ORS;  Service: Vascular;  Laterality: N/A;  . RIGHT/LEFT HEART CATH AND CORONARY ANGIOGRAPHY N/A 06/23/2016   Procedure: Right/Left Heart Cath and Coronary Angiography;  Surgeon: [name], MD;  Location: [facility] INVASIVE CV LAB;  Service: Cardiovascular;  Laterality: N/A;  . TEE WITHOUT CARDIOVERSION N/A 06/26/2016    Procedure: TRANSESOPHAGEAL ECHOCARDIOGRAM (TEE);  Surgeon: Pricilla Riffle, MD;  Location: [facility] ENDOSCOPY;  Service: Cardiovascular;  Laterality: N/A;  . WRIST FRACTURE SURGERY Right ~ 2000   "got 8 screws in there"    Current Outpatient Prescriptions  Medication Sig Dispense Refill  . acetaminophen (TYLENOL) 500 MG tablet Take 1,000 mg by mouth every 6 (six) hours as needed for mild pain or headache.    [name] aspirin EC 81 MG EC tablet Take 1 tablet (81 mg total) by mouth daily. 90 tablet 3  . atorvastatin (LIPITOR) 80 MG tablet Take 1 tablet (80 mg total) by mouth daily at 6 PM. 60 tablet 1  . beclomethasone (QVAR) 40 MCG/ACT inhaler Inhale 1 puff into the lungs 2 (two) times daily. 1 Inhaler 0  . carvedilol (COREG) 3.125 MG tablet Take 1 tablet (3.125 mg total) by mouth 2 (two) times daily with a meal. 60 tablet 1  . cinacalcet (SENSIPAR) 30 MG tablet Take 4 tablets (120 mg total) by mouth daily with supper. (Patient taking differently: Take 30 mg by mouth daily with supper. ) 120 tablet 0  . cinacalcet (SENSIPAR) 90 MG tablet Take 90 mg by mouth daily with supper.    . clopidogrel (PLAVIX) 75 MG tablet Take 75 mg by mouth daily.    [name] doxycycline (VIBRA-TABS) 100 MG tablet     . ezetimibe (ZETIA) 10 MG tablet Take 1 tablet (10 mg total) by mouth daily. 90 tablet 3  . folic acid-vitamin b complex-vitamin c-selenium-zinc (DIALYVITE) 3 MG TABS tablet Take 1 tablet by mouth Every Tuesday,Thursday,and Saturday  with dialysis.    . furosemide (LASIX) 80 MG tablet Take 80 mg by mouth 2 (two) times daily.     [name] gabapentin (NEURONTIN) 300 MG capsule Take 300 mg by mouth at bedtime.     [name] HYDROcodone-acetaminophen (NORCO) 5-325 MG tablet Take 1-2 tablets by mouth every 6 (six) hours as needed for moderate pain or severe pain. (Patient not taking: Reported on 10/16/2016) 50 tablet 0  . sevelamer carbonate (RENVELA) 2.4 g PACK Take 2.4 g by mouth 3 (three) times daily with meals. (Patient taking differently: Take  2.4 g by mouth daily. WITH A SNACK) 90 each 0  . SPIRIVA HANDIHALER 18 MCG inhalation capsule Place 18 mcg into inhaler and inhale daily.      No current facility-administered medications for this visit.      Allergies:   Ramipril and Aspirin   Social History:  The patient  reports that he has been smoking Cigarettes.  He has a 20.00 pack-year smoking history. He has quit using smokeless tobacco. His smokeless tobacco use included Chew. He reports that he does not drink alcohol or use drugs.   Family History:   family history includes CAD in his mother; Hypertension in his mother.    Review of Systems: Review of Systems  Constitutional: Negative.   Respiratory: Positive for shortness of breath.   Cardiovascular: Positive for leg swelling.  Gastrointestinal: Negative.   Musculoskeletal: Negative.   Neurological: Negative.   Psychiatric/Behavioral: Negative.   All other systems reviewed and are negative.    PHYSICAL EXAM: VS:  There were no vitals taken for this visit. , BMI There is no height or weight on file to calculate BMI. GEN: Well nourished, well developed, in no acute distress , Morbidly obese HEENT: normal  Neck: no JVD, carotid bruits, or masses Cardiac: RRR; no murmurs, rubs, or gallops,Nonpitting lower extremity edema  Respiratory:  clear to auscultation bilaterally, normal work of breathing GI: soft, nontender, nondistended, + BS MS: no deformity or atrophy  Skin: warm and dry, no rash Neuro:  Strength and sensation are intact Psych: euthymic mood, full affect    Recent Labs: 06/21/2016: ALT 14 06/23/2016: TSH 2.302 09/25/2016: BUN 41; Creatinine, Ser 9.15; Platelets 264 09/29/2016: Hemoglobin 12.6; Potassium 4.1; Sodium 134    Lipid Panel Lab Results  Component Value Date   CHOL 170 06/22/2016   HDL 35 (L) 06/22/2016   LDLCALC 98 06/22/2016   TRIG 185 (H) 06/22/2016      Wt Readings from Last 3 Encounters:  10/16/16 (!) 315 lb (142.9 kg)  09/29/16  (!) 316 lb (143.3 kg)  09/25/16 (!) 316 lb (143.3 kg)       ASSESSMENT AND PLAN:  Essential hypertension Blood pressure is well controlled on today's visit. No changes made to the medications. Blood pressure tends to run low after dialysis  Cerebrovascular accident (CVA) due to embolism of right middle cerebral artery (HCC) 30 day monitor reviewed, final report still pending though reviewing each day there is no significant arrhythmia. We'll continue on aspirin and Plavix Unable to exclude mural thrombus given his cardiomyopathy  unable to use warfarin as he is on dialysis, hemodialysis  Chronic combined systolic and diastolic congestive heart failure (HCC) On high dose Lasix twice a day with hemodialysis  Peripheral vascular disease (HCC)  Mixed hyperlipidemia We will add zetia with statin Goal LDL <70  Type 2 diabetes mellitus with stage 5 chronic kidney disease not on chronic dialysis, without long-term current use of insulin (  HCC) We have encouraged continued exercise, careful diet management in an effort to lose weight. Hemoglobin A1c relatively well-controlled  Acute respiratory failure with hypoxia and hypercapnia (HCC) Respiratory status is stable Continues to smoke We have encouraged him to continue to work on weaning his cigarettes and smoking cessation. He will continue to work on this and does not want any assistance with chantix.   Aortic abscess Etiology unclear, recommendation made to consider revisualization by TEE in 3 months time Seen on TEE in the past and CT Cardiac scan   Disposition:   F/U  6 months   Total encounter time more than 45 minutes  Greater than 50% was spent in counseling and coordination of care with the patient   No orders of the defined types were placed in this encounter.    Signed, Dossie Arbour, M.D., Ph.D. 10/30/2016  [facility] [place], [place] [phone]

## 2016-10-31 ENCOUNTER — Encounter: Payer: Self-pay | Admitting: Cardiovascular Disease

## 2016-11-26 NOTE — Progress Notes (Deleted)
Cardiology Office Note  Date:  11/26/2016   ID:  Justin Fitzgerald 1960/08/24, MRN [id number]  PCP:  Dorothey Baseman, MD   No chief complaint on file.   HPI:  Justin Fitzgerald is a 56 y.o. male with history of  ESRD on PD for at least 9 years recently started with HD,  Remote peritonitis infection, in hospital on ABX chronic combined systolic and diastolic heart failure with EF of 20%, admission in early 06/2016,  TEE With EF 35%, Nonischemic cardiomyopathy February 2018 hypertension,  PVD,  diabetes mellitus,  Still smoking, started young recent stroke in 2/18  who presents for follow-up of his stroke, chronic diastolic and systolic CHF   Recent hospitalization at [facility] for stroke February 2018 Hospital records reviewed as below transesophageal echo/TEE  ejection fraction 35% "walled off regiion next to aorta between left and noncoronary cusps of aortic valve that probably represents remote abscess. There does not appear to be a communication with the aorta. "  He had follow-up CT cardiac study Poor venous access, inadequate opacification of aortic root Moderately calcified aortic atherosclerosis 12 x 19 mm cavity behind non coronary cusp/ and Cam[place]/LC fissure that may represent a sinus of Valsalva aneurysm with possible contained rupture vs wall off abscess.  Recommendation was for revisualization with TEE at 3 months  Cardiac catheterization study reviewed Heavily calcified coronary arteries with chronic occlusion of the first OM branch of the circumflex, moderate diffuse RCA stenosis, and mild nonobstructive LAD stenosis Normal right heart hemodynamics and normal LVEDP Known severe LV systolic dysfunction Medical management was recommended  Lab work reviewed previously by myself HBA1C 5.6 Total chol 170,  LDL 98  Reports that he feels well, occasional shortness of breath HD every Tuesday, Thursday, and Saturday Continues to smoke Takes Lasix 160 mg  twice a day Does not weigh himself  Other past medical history reviewed  admitted to [facility] in late December 2017 for acute hypoxic respiratory failure requiring oxygen supplementation in the setting of COPD exacerbation. treated with IV steroids and nebulizers and antibiotics. complicated with peritonitis requiring antibiotic therapy.  mildly elevated troponin  felt to be secondary to demand ischemia    admitted June 21 2016 with stroke after developing left facial droop and slurring of his speech   MRI brain demonstrated right MCA territory infarct felt to be embolic  Lower extremity dopplers were negative for DVT.  Echo showed newly diagnosed systolic heart failure with EF of 20-25%, diffuse hypokinesis, G1DD, mild aortic stenosis, aortic root mildly dilated, mildly to moderately calcified mitral annulus, left atrium moderately dilated.    30 day event monitor   asymptomatic NSVT while in the hospital, beta blocker was titrated as tolerated.   Right and left heart catheterization to 06/23/2016: Mid LAD 40% stenosed with calcified lesion, diffuse calcific LAD stenosis, nonobstructive. OM1 CTO. Proximal RCA 60% stenosed, mid to distal RCA 50% stenosed.     PMH:   has a past medical history of CHF (congestive heart failure) (HCC); COPD (chronic obstructive pulmonary disease) (HCC); Dialysis patient [facility]); Dyspnea; ESRD (end stage renal disease) on dialysis (HCC); History of gout; Hyperlipidemia; Hypertension; Peripheral vascular disease (HCC); Stroke [facility]) (06/20/2016); Type II diabetes mellitus (HCC); and Umbilical hernia.  PSH:    Past Surgical History:  Procedure Laterality Date  . AV FISTULA PLACEMENT Left 07/28/2016   Procedure: ARTERIOVENOUS (AV) FISTULA CREATION;  Surgeon: Renford Dills, MD;  Location: [facility] ORS;  Service: Vascular;  Laterality: Left;  . CARDIAC  CATHETERIZATION    . DIALYSIS/PERMA CATHETER INSERTION N/A 06/28/2016   Procedure: Dialysis/Perma Catheter Insertion;   Surgeon: Renford Dills, MD;  Location: [facility] INVASIVE CV LAB;  Service: Cardiovascular;  Laterality: N/A;  . DIALYSIS/PERMA CATHETER INSERTION N/A 08/25/2016   Procedure: Dialysis/Perma Catheter Insertion;  Surgeon: Annice Needy, MD;  Location: [facility] INVASIVE CV LAB;  Service: Cardiovascular;  Laterality: N/A;  . DIALYSIS/PERMA CATHETER INSERTION N/A 09/08/2016   Procedure: Dialysis/Perma Catheter Insertion;  Surgeon: Renford Dills, MD;  Location: [facility] INVASIVE CV LAB;  Service: Cardiovascular;  Laterality: N/A;  . EXTERIORIZATION OF A CONTINUOUS AMBULATORY PERITONEAL DIALYSIS CATHETER    . FRACTURE SURGERY    . IR GENERIC HISTORICAL  08/07/2016   IR VENIPUNCTURE 44YRS/OLDER BY MD 08/07/2016 Jolaine Click, MD [facility]  . IR GENERIC HISTORICAL  08/07/2016   IR US GUIDE VASC ACCESS RIGHT 08/07/2016 Jolaine Click, MD [facility]  . PERIPHERAL VASCULAR CATHETERIZATION N/A 03/08/2016   Procedure: Dialysis/Perma Catheter Insertion;  Surgeon: Renford Dills, MD;  Location: [facility] INVASIVE CV LAB;  Service: Cardiovascular;  Laterality: N/A;  . PERIPHERAL VASCULAR CATHETERIZATION N/A 04/20/2016   Procedure: Dialysis/Perma Catheter Removal;  Surgeon: Annice Needy, MD;  Location: [facility] INVASIVE CV LAB;  Service: Cardiovascular;  Laterality: N/A;  . PERIPHERAL VASCULAR CATHETERIZATION N/A 05/25/2016   Procedure: Dialysis/Perma Catheter Insertion;  Surgeon: Annice Needy, MD;  Location: [facility] INVASIVE CV LAB;  Service: Cardiovascular;  Laterality: N/A;  . REMOVAL OF A DIALYSIS CATHETER N/A 09/29/2016   Procedure: REMOVAL OF A DIALYSIS CATHETER ( PERITONEAL DIALYSIS CATH );  Surgeon: Renford Dills, MD;  Location: [facility] ORS;  Service: Vascular;  Laterality: N/A;  . RIGHT/LEFT HEART CATH AND CORONARY ANGIOGRAPHY N/A 06/23/2016   Procedure: Right/Left Heart Cath and Coronary Angiography;  Surgeon: [name], MD;  Location: [facility] INVASIVE CV LAB;  Service: Cardiovascular;  Laterality: N/A;  . TEE WITHOUT  CARDIOVERSION N/A 06/26/2016   Procedure: TRANSESOPHAGEAL ECHOCARDIOGRAM (TEE);  Surgeon: Pricilla Riffle, MD;  Location: [facility] ENDOSCOPY;  Service: Cardiovascular;  Laterality: N/A;  . WRIST FRACTURE SURGERY Right ~ 2000   "got 8 screws in there"    Current Outpatient Prescriptions  Medication Sig Dispense Refill  . acetaminophen (TYLENOL) 500 MG tablet Take 1,000 mg by mouth every 6 (six) hours as needed for mild pain or headache.    [name] aspirin EC 81 MG EC tablet Take 1 tablet (81 mg total) by mouth daily. 90 tablet 3  . atorvastatin (LIPITOR) 80 MG tablet Take 1 tablet (80 mg total) by mouth daily at 6 PM. 60 tablet 1  . beclomethasone (QVAR) 40 MCG/ACT inhaler Inhale 1 puff into the lungs 2 (two) times daily. 1 Inhaler 0  . carvedilol (COREG) 3.125 MG tablet Take 1 tablet (3.125 mg total) by mouth 2 (two) times daily with a meal. 60 tablet 1  . cinacalcet (SENSIPAR) 30 MG tablet Take 4 tablets (120 mg total) by mouth daily with supper. (Patient taking differently: Take 30 mg by mouth daily with supper. ) 120 tablet 0  . cinacalcet (SENSIPAR) 90 MG tablet Take 90 mg by mouth daily with supper.    . clopidogrel (PLAVIX) 75 MG tablet Take 75 mg by mouth daily.    [name] doxycycline (VIBRA-TABS) 100 MG tablet     . ezetimibe (ZETIA) 10 MG tablet Take 1 tablet (10 mg total) by mouth daily. 90 tablet 3  . folic acid-vitamin b complex-vitamin c-selenium-zinc (DIALYVITE) 3 MG TABS tablet Take 1 tablet  by mouth Every Tuesday,Thursday,and Saturday with dialysis.    . furosemide (LASIX) 80 MG tablet Take 80 mg by mouth 2 (two) times daily.     [name] gabapentin (NEURONTIN) 300 MG capsule Take 300 mg by mouth at bedtime.     [name] HYDROcodone-acetaminophen (NORCO) 5-325 MG tablet Take 1-2 tablets by mouth every 6 (six) hours as needed for moderate pain or severe pain. (Patient not taking: Reported on 10/16/2016) 50 tablet 0  . sevelamer carbonate (RENVELA) 2.4 g PACK Take 2.4 g by mouth 3 (three) times daily with meals.  (Patient taking differently: Take 2.4 g by mouth daily. WITH A SNACK) 90 each 0  . SPIRIVA HANDIHALER 18 MCG inhalation capsule Place 18 mcg into inhaler and inhale daily.      No current facility-administered medications for this visit.      Allergies:   Ramipril and Aspirin   Social History:  The patient  reports that he has been smoking Cigarettes.  He has a 20.00 pack-year smoking history. He has quit using smokeless tobacco. His smokeless tobacco use included Chew. He reports that he does not drink alcohol or use drugs.   Family History:   family history includes CAD in his mother; Hypertension in his mother.    Review of Systems: Review of Systems  Constitutional: Negative.   Respiratory: Positive for shortness of breath.   Cardiovascular: Positive for leg swelling.  Gastrointestinal: Negative.   Musculoskeletal: Negative.   Neurological: Negative.   Psychiatric/Behavioral: Negative.   All other systems reviewed and are negative.    PHYSICAL EXAM: VS:  There were no vitals taken for this visit. , BMI There is no height or weight on file to calculate BMI. GEN: Well nourished, well developed, in no acute distress , Morbidly obese HEENT: normal  Neck: no JVD, carotid bruits, or masses Cardiac: RRR; no murmurs, rubs, or gallops,Nonpitting lower extremity edema  Respiratory:  clear to auscultation bilaterally, normal work of breathing GI: soft, nontender, nondistended, + BS MS: no deformity or atrophy  Skin: warm and dry, no rash Neuro:  Strength and sensation are intact Psych: euthymic mood, full affect    Recent Labs: 06/21/2016: ALT 14 06/23/2016: TSH 2.302 09/25/2016: BUN 41; Creatinine, Ser 9.15; Platelets 264 09/29/2016: Hemoglobin 12.6; Potassium 4.1; Sodium 134    Lipid Panel Lab Results  Component Value Date   CHOL 170 06/22/2016   HDL 35 (L) 06/22/2016   LDLCALC 98 06/22/2016   TRIG 185 (H) 06/22/2016      Wt Readings from Last 3 Encounters:  10/16/16  (!) 315 lb (142.9 kg)  09/29/16 (!) 316 lb (143.3 kg)  09/25/16 (!) 316 lb (143.3 kg)       ASSESSMENT AND PLAN:  Essential hypertension Blood pressure is well controlled on today's visit. No changes made to the medications. Blood pressure tends to run low after dialysis  Cerebrovascular accident (CVA) due to embolism of right middle cerebral artery (HCC) 30 day monitor reviewed, final report still pending though reviewing each day there is no significant arrhythmia. We'll continue on aspirin and Plavix Unable to exclude mural thrombus given his cardiomyopathy  unable to use warfarin as he is on dialysis, hemodialysis  Chronic combined systolic and diastolic congestive heart failure (HCC) On high dose Lasix twice a day with hemodialysis  Peripheral vascular disease (HCC)  Mixed hyperlipidemia We will add zetia with statin Goal LDL <70  Type 2 diabetes mellitus with stage 5 chronic kidney disease not on chronic dialysis, without  long-term current use of insulin (HCC) We have encouraged continued exercise, careful diet management in an effort to lose weight. Hemoglobin A1c relatively well-controlled  Acute respiratory failure with hypoxia and hypercapnia (HCC) Respiratory status is stable Continues to smoke We have encouraged him to continue to work on weaning his cigarettes and smoking cessation. He will continue to work on this and does not want any assistance with chantix.   Aortic abscess Etiology unclear, recommendation made to consider revisualization by TEE in 3 months time Seen on TEE in the past and CT Cardiac scan    Disposition:   F/U  6 months   Total encounter time more than 45 minutes  Greater than 50% was spent in counseling and coordination of care with the patient   No orders of the defined types were placed in this encounter.    Signed, Dossie Arbour, M.D., Ph.D. 11/26/2016  [facility] [place], [place] [phone]

## 2016-11-30 ENCOUNTER — Ambulatory Visit: Payer: Self-pay | Admitting: Cardiovascular Disease

## 2016-12-01 ENCOUNTER — Encounter (INDEPENDENT_AMBULATORY_CARE_PROVIDER_SITE_OTHER): Payer: Self-pay

## 2016-12-01 ENCOUNTER — Other Ambulatory Visit (INDEPENDENT_AMBULATORY_CARE_PROVIDER_SITE_OTHER): Payer: Self-pay

## 2016-12-04 MED ORDER — CEFAZOLIN SODIUM-DEXTROSE 2-4 GM/100ML-% IV SOLN
2.0000 g | Freq: Once | INTRAVENOUS | Status: AC
  Administered 2016-12-05: 2 g via INTRAVENOUS

## 2016-12-05 ENCOUNTER — Encounter
Admission: RE | Disposition: A | Discharge status: Home / Self Care | Payer: Self-pay | Source: Ambulatory Visit | Attending: Vascular Surgery

## 2016-12-05 ENCOUNTER — Ambulatory Visit
Admission: RE | Admit: 2016-12-05 | Discharge: 2016-12-05 | Disposition: A | Discharge status: Home / Self Care | Payer: Medicare Other | Source: Ambulatory Visit | Attending: Vascular Surgery | Admitting: Vascular Surgery

## 2016-12-05 ENCOUNTER — Encounter: Payer: Self-pay | Admitting: *Deleted

## 2016-12-05 DIAGNOSIS — M109 Gout, unspecified: Secondary | ICD-10-CM | POA: Insufficient documentation

## 2016-12-05 DIAGNOSIS — Z955 Presence of coronary angioplasty implant and graft: Secondary | ICD-10-CM | POA: Diagnosis not present

## 2016-12-05 DIAGNOSIS — Y832 Surgical operation with anastomosis, bypass or graft as the cause of abnormal reaction of the patient, or of later complication, without mention of misadventure at the time of the procedure: Secondary | ICD-10-CM | POA: Diagnosis not present

## 2016-12-05 DIAGNOSIS — I509 Heart failure, unspecified: Secondary | ICD-10-CM | POA: Insufficient documentation

## 2016-12-05 DIAGNOSIS — Z888 Allergy status to other drugs, medicaments and biological substances status: Secondary | ICD-10-CM | POA: Insufficient documentation

## 2016-12-05 DIAGNOSIS — N186 End stage renal disease: Secondary | ICD-10-CM

## 2016-12-05 DIAGNOSIS — I953 Hypotension of hemodialysis: Secondary | ICD-10-CM | POA: Insufficient documentation

## 2016-12-05 DIAGNOSIS — E1151 Type 2 diabetes mellitus with diabetic peripheral angiopathy without gangrene: Secondary | ICD-10-CM | POA: Diagnosis not present

## 2016-12-05 DIAGNOSIS — Z8673 Personal history of transient ischemic attack (TIA), and cerebral infarction without residual deficits: Secondary | ICD-10-CM | POA: Diagnosis not present

## 2016-12-05 DIAGNOSIS — E1122 Type 2 diabetes mellitus with diabetic chronic kidney disease: Secondary | ICD-10-CM | POA: Insufficient documentation

## 2016-12-05 DIAGNOSIS — Z992 Dependence on renal dialysis: Secondary | ICD-10-CM | POA: Diagnosis not present

## 2016-12-05 DIAGNOSIS — I10 Essential (primary) hypertension: Secondary | ICD-10-CM | POA: Diagnosis not present

## 2016-12-05 DIAGNOSIS — Z9889 Other specified postprocedural states: Secondary | ICD-10-CM | POA: Diagnosis not present

## 2016-12-05 DIAGNOSIS — F17210 Nicotine dependence, cigarettes, uncomplicated: Secondary | ICD-10-CM | POA: Diagnosis not present

## 2016-12-05 DIAGNOSIS — Z886 Allergy status to analgesic agent status: Secondary | ICD-10-CM | POA: Insufficient documentation

## 2016-12-05 DIAGNOSIS — J449 Chronic obstructive pulmonary disease, unspecified: Secondary | ICD-10-CM | POA: Diagnosis not present

## 2016-12-05 DIAGNOSIS — E119 Type 2 diabetes mellitus without complications: Secondary | ICD-10-CM

## 2016-12-05 DIAGNOSIS — I132 Hypertensive heart and chronic kidney disease with heart failure and with stage 5 chronic kidney disease, or end stage renal disease: Secondary | ICD-10-CM | POA: Insufficient documentation

## 2016-12-05 DIAGNOSIS — T82858A Stenosis of vascular prosthetic devices, implants and grafts, initial encounter: Secondary | ICD-10-CM | POA: Insufficient documentation

## 2016-12-05 DIAGNOSIS — Z8249 Family history of ischemic heart disease and other diseases of the circulatory system: Secondary | ICD-10-CM | POA: Insufficient documentation

## 2016-12-05 DIAGNOSIS — E785 Hyperlipidemia, unspecified: Secondary | ICD-10-CM | POA: Diagnosis not present

## 2016-12-05 DIAGNOSIS — T82868A Thrombosis of vascular prosthetic devices, implants and grafts, initial encounter: Secondary | ICD-10-CM

## 2016-12-05 HISTORY — PX: A/V FISTULAGRAM: CATH118298

## 2016-12-05 LAB — GLUCOSE, CAPILLARY: Glucose-Capillary: 76 mg/dL (ref 65–99)

## 2016-12-05 LAB — POTASSIUM (ARMC VASCULAR LAB ONLY): Potassium (ARMC vascular lab): 5.6 — ABNORMAL HIGH (ref 3.5–5.1)

## 2016-12-05 SURGERY — A/V FISTULAGRAM
Anesthesia: Moderate Sedation | Site: Arm Upper | Laterality: Left

## 2016-12-05 MED ORDER — IPRATROPIUM-ALBUTEROL 0.5-2.5 (3) MG/3ML IN SOLN
RESPIRATORY_TRACT | Status: AC
  Filled 2016-12-05: qty 3

## 2016-12-05 MED ORDER — MIDAZOLAM HCL 5 MG/5ML IJ SOLN
INTRAMUSCULAR | Status: AC
  Filled 2016-12-05: qty 5

## 2016-12-05 MED ORDER — IPRATROPIUM-ALBUTEROL 0.5-2.5 (3) MG/3ML IN SOLN
3.0000 mL | Freq: Once | RESPIRATORY_TRACT | Status: AC
  Administered 2016-12-05: 3 mL via RESPIRATORY_TRACT

## 2016-12-05 MED ORDER — HEPARIN SODIUM (PORCINE) 1000 UNIT/ML IJ SOLN
INTRAMUSCULAR | Status: AC
  Filled 2016-12-05: qty 1

## 2016-12-05 MED ORDER — DIPHENHYDRAMINE HCL 50 MG/ML IJ SOLN
INTRAMUSCULAR | Status: AC
  Filled 2016-12-05: qty 1

## 2016-12-05 MED ORDER — IOPAMIDOL (ISOVUE-300) INJECTION 61%
INTRAVENOUS | Status: DC | PRN
  Administered 2016-12-05: 25 mL via INTRAVENOUS

## 2016-12-05 MED ORDER — HEPARIN (PORCINE) IN NACL 2-0.9 UNIT/ML-% IJ SOLN
INTRAMUSCULAR | Status: AC
  Filled 2016-12-05: qty 1000

## 2016-12-05 MED ORDER — FENTANYL CITRATE (PF) 100 MCG/2ML IJ SOLN
INTRAMUSCULAR | Status: DC | PRN
  Administered 2016-12-05: 25 ug via INTRAVENOUS
  Administered 2016-12-05 (×3): 50 ug via INTRAVENOUS

## 2016-12-05 MED ORDER — MIDAZOLAM HCL 2 MG/2ML IJ SOLN
INTRAMUSCULAR | Status: DC | PRN
  Administered 2016-12-05 (×2): 2 mg via INTRAVENOUS
  Administered 2016-12-05: 1 mg via INTRAVENOUS

## 2016-12-05 MED ORDER — HEPARIN SODIUM (PORCINE) 1000 UNIT/ML IJ SOLN
INTRAMUSCULAR | Status: DC | PRN
  Administered 2016-12-05: 3000 [IU] via INTRAVENOUS

## 2016-12-05 MED ORDER — LIDOCAINE HCL (PF) 1 % IJ SOLN
INTRAMUSCULAR | Status: AC
  Filled 2016-12-05: qty 30

## 2016-12-05 MED ORDER — SODIUM CHLORIDE 0.9 % IV SOLN
INTRAVENOUS | Status: DC
  Administered 2016-12-05: 09:00:00 via INTRAVENOUS

## 2016-12-05 MED ORDER — FENTANYL CITRATE (PF) 100 MCG/2ML IJ SOLN
INTRAMUSCULAR | Status: AC
  Filled 2016-12-05: qty 2

## 2016-12-05 SURGICAL SUPPLY — 21 items
BALLN LUTONIX AV 7X60X75 (BALLOONS) ×3
BALLN LUTONIX AV 8X60X75 (BALLOONS) ×3
BALLOON LUTONIX AV 7X60X75 (BALLOONS) ×1 IMPLANT
BALLOON LUTONIX AV 8X60X75 (BALLOONS) ×1 IMPLANT
CATH BEACON 5 .035 40 KMP TP (CATHETERS) ×1 IMPLANT
CATH BEACON 5 .038 40 KMP TP (CATHETERS) ×2
CATH LANTERN 025 150CM 45TIP (MICROCATHETER) ×3 IMPLANT
COIL 400 COMPLEX STD 6X20CM (Vascular Products) ×3 IMPLANT
COIL 400 COMPLEX STD 8X25CM (Vascular Products) ×3 IMPLANT
DEVICE PRESTO INFLATION (MISCELLANEOUS) ×3 IMPLANT
DEVICE TORQUE (MISCELLANEOUS) ×3 IMPLANT
DRAPE BRACHIAL (DRAPES) ×3 IMPLANT
GUIDEWIRE ANGLED .035 180CM (WIRE) ×3 IMPLANT
HANDLE DETACHMENT COIL (MISCELLANEOUS) ×3 IMPLANT
NEEDLE ENTRY 21GA 7CM ECHOTIP (NEEDLE) ×3 IMPLANT
PACK ANGIOGRAPHY (CUSTOM PROCEDURE TRAY) ×3 IMPLANT
SET INTRO CAPELLA COAXIAL (SET/KITS/TRAYS/PACK) ×3 IMPLANT
SHEATH BRITE TIP 6FRX5.5 (SHEATH) ×3 IMPLANT
SUT MNCRL AB 4-0 PS2 18 (SUTURE) ×3 IMPLANT
TOWEL OR 17X26 4PK STRL BLUE (TOWEL DISPOSABLE) ×3 IMPLANT
WIRE MAGIC TOR.035 180C (WIRE) ×3 IMPLANT

## 2016-12-05 NOTE — H&P (Signed)
[facility] Admission History & Physical  MRN : [id number]  Justin Fitzgerald is a 56 y.o. (1961/04/06) male who presents with chief complaint of No chief complaint on file. [name]  History of Present Illness: I am asked to evaluate the patient by the dialysis center. The patient was sent here because they were unable to achieve adequate dialysis this morning. Furthermore the Center states there is very poor thrill and bruit. The patient states there there have been increasing problems with the access, such as "pulling clots" during dialysis and prolonged bleeding after decannulation. The patient estimates these problems have been going on for several weeks. The patient is unaware of any other change.  At the present time he is dialyzed with a left brachial cephalic fistula created on 07/28/2016.  Patient denies pain or tenderness overlying the access.  There is no pain with dialysis.  The patient denies hand pain or finger pain consistent with steal syndrome.   There have not been any past interventions or declots of this access.  The patient is chronically hypotensive on dialysis.  Current Facility-Administered Medications  Medication Dose Route Frequency Provider Last Rate Last Dose  . 0.9 %  sodium chloride infusion   Intravenous Continuous Axil Copeman, Latina Craver, MD      . ceFAZolin (ANCEF) IVPB 2g/100 mL premix  2 g Intravenous Once Winfield Caba, Latina Craver, MD        Past Medical History:  Diagnosis Date  . CHF (congestive heart failure) (HCC)   . COPD (chronic obstructive pulmonary disease) (HCC)   . Dialysis patient (HCC)    Tues. Thurs. Sat.  [name] Dyspnea    with exertion  . ESRD (end stage renal disease) on dialysis (HCC)    "[facility]S; [facility] [place]" (06/22/2016)  . History of gout   . Hyperlipidemia   . Hypertension   . Peripheral vascular disease (HCC)   . Stroke (HCC) 06/20/2016   denies residual on 06/22/2016  . Type II diabetes mellitus (HCC)    diet  control  . Umbilical hernia     Past Surgical History:  Procedure Laterality Date  . AV FISTULA PLACEMENT Left 07/28/2016   Procedure: ARTERIOVENOUS (AV) FISTULA CREATION;  Surgeon: Renford Dills, MD;  Location: [facility] ORS;  Service: Vascular;  Laterality: Left;  . CARDIAC CATHETERIZATION    . DIALYSIS/PERMA CATHETER INSERTION N/A 06/28/2016   Procedure: Dialysis/Perma Catheter Insertion;  Surgeon: Renford Dills, MD;  Location: [facility] INVASIVE CV LAB;  Service: Cardiovascular;  Laterality: N/A;  . DIALYSIS/PERMA CATHETER INSERTION N/A 08/25/2016   Procedure: Dialysis/Perma Catheter Insertion;  Surgeon: Annice Needy, MD;  Location: [facility] INVASIVE CV LAB;  Service: Cardiovascular;  Laterality: N/A;  . DIALYSIS/PERMA CATHETER INSERTION N/A 09/08/2016   Procedure: Dialysis/Perma Catheter Insertion;  Surgeon: Renford Dills, MD;  Location: [facility] INVASIVE CV LAB;  Service: Cardiovascular;  Laterality: N/A;  . EXTERIORIZATION OF A CONTINUOUS AMBULATORY PERITONEAL DIALYSIS CATHETER    . FRACTURE SURGERY    . IR GENERIC HISTORICAL  08/07/2016   IR VENIPUNCTURE 20YRS/OLDER BY MD 08/07/2016 Jolaine Click, MD [facility]  . IR GENERIC HISTORICAL  08/07/2016   IR US GUIDE VASC ACCESS RIGHT 08/07/2016 Jolaine Click, MD [facility]  . PERIPHERAL VASCULAR CATHETERIZATION N/A 03/08/2016   Procedure: Dialysis/Perma Catheter Insertion;  Surgeon: Renford Dills, MD;  Location: [facility] INVASIVE CV LAB;  Service: Cardiovascular;  Laterality: N/A;  . PERIPHERAL VASCULAR CATHETERIZATION N/A 04/20/2016   Procedure: Dialysis/Perma Catheter Removal;  Surgeon:  Annice Needy, MD;  Location: [facility] INVASIVE CV LAB;  Service: Cardiovascular;  Laterality: N/A;  . PERIPHERAL VASCULAR CATHETERIZATION N/A 05/25/2016   Procedure: Dialysis/Perma Catheter Insertion;  Surgeon: Annice Needy, MD;  Location: [facility] INVASIVE CV LAB;  Service: Cardiovascular;  Laterality: N/A;  . REMOVAL OF A DIALYSIS CATHETER N/A 09/29/2016   Procedure: REMOVAL  OF A DIALYSIS CATHETER ( PERITONEAL DIALYSIS CATH );  Surgeon: Renford Dills, MD;  Location: [facility] ORS;  Service: Vascular;  Laterality: N/A;  . RIGHT/LEFT HEART CATH AND CORONARY ANGIOGRAPHY N/A 06/23/2016   Procedure: Right/Left Heart Cath and Coronary Angiography;  Surgeon: [name], MD;  Location: [facility] INVASIVE CV LAB;  Service: Cardiovascular;  Laterality: N/A;  . TEE WITHOUT CARDIOVERSION N/A 06/26/2016   Procedure: TRANSESOPHAGEAL ECHOCARDIOGRAM (TEE);  Surgeon: Pricilla Riffle, MD;  Location: [facility] ENDOSCOPY;  Service: Cardiovascular;  Laterality: N/A;  . WRIST FRACTURE SURGERY Right ~ 2000   "got 8 screws in there"    Social History Social History  Substance Use Topics  . Smoking status: Current Every Day Smoker    Packs/day: 0.50    Years: 40.00    Types: Cigarettes  . Smokeless tobacco: Former User    Types: Chew     Comment: 06/22/2016 "quit chewing when I was 87 or 56 years old"  . Alcohol use No    Family History Family History  Problem Relation Age of Onset  . Hypertension Mother   . CAD Mother     No family history of bleeding or clotting disorders, autoimmune disease or porphyria  Allergies  Allergen Reactions  . Ramipril Shortness Of Breath  . Aspirin Other (See Comments)    kidney disease     REVIEW OF SYSTEMS (Negative unless checked)  Constitutional: [] Weight loss  [] Fever  [] Chills Cardiac: [] Chest pain   [] Chest pressure   [] Palpitations   [] Shortness of breath when laying flat   [] Shortness of breath at rest   [x] Shortness of breath with exertion. Vascular:  [] Pain in legs with walking   [] Pain in legs at rest   [] Pain in legs when laying flat   [] Claudication   [] Pain in feet when walking  [] Pain in feet at rest  [] Pain in feet when laying flat   [] History of DVT   [] Phlebitis   [] Swelling in legs   [] Varicose veins   [] Non-healing ulcers Pulmonary:   [] Uses home oxygen   [] Productive cough   [] Hemoptysis   [] Wheeze  [] COPD   [] Asthma Neurologic:   [] Dizziness  [] Blackouts   [] Seizures   [] History of stroke   [] History of TIA  [] Aphasia   [] Temporary blindness   [] Dysphagia   [] Weakness or numbness in arms   [] Weakness or numbness in legs Musculoskeletal:  [] Arthritis   [] Joint swelling   [] Joint pain   [] Low back pain Hematologic:  [] Easy bruising  [] Easy bleeding   [] Hypercoagulable state   [] Anemic  [] Hepatitis Gastrointestinal:  [] Blood in stool   [] Vomiting blood  [] Gastroesophageal reflux/heartburn   [] Difficulty swallowing. Genitourinary:  [x] Chronic kidney disease   [] Difficult urination  [] Frequent urination  [] Burning with urination   [] Blood in urine Skin:  [] Rashes   [] Ulcers   [] Wounds Psychological:  [] History of anxiety   []  History of major depression.  Physical Examination  There were no vitals filed for this visit. There is no height or weight on file to calculate BMI. Gen: WD/WN, NAD Head: [place]/AT, No temporalis wasting. Prominent temp pulse not noted. Ear/Nose/Throat: Hearing grossly  intact, nares w/o erythema or drainage, oropharynx w/o Erythema/Exudate,  Eyes: Conjunctiva clear, sclera non-icteric Neck: Trachea midline.  No JVD.  Pulmonary:  Good air movement, respirations not labored, no use of accessory muscles.  Cardiac: RRR, normal S1, S2. Vascular: left brachial cephalic fistula with good thrilland good bruit Vessel Right Left  Radial Palpable Palpable  Ulnar Not Palpable Not Palpable  Brachial Palpable Palpable  Carotid Palpable, without bruit Palpable, without bruit  Gastrointestinal: soft, non-tender/non-distended. No guarding/reflex.  Musculoskeletal: M/S 5/5 throughout.  Extremities without ischemic changes.  No deformity or atrophy.  Neurologic: Sensation grossly intact in extremities.  Symmetrical.  Speech is fluent. Motor exam as listed above. Psychiatric: Judgment intact, Mood & affect appropriate for pt's clinical situation. Dermatologic: No rashes or ulcers noted.  No cellulitis or open  wounds. Lymph : No Cervical, Axillary, or Inguinal lymphadenopathy.   CBC Lab Results  Component Value Date   WBC 7.2 09/25/2016   HGB 12.6 (L) 09/29/2016   HCT 37.0 (L) 09/29/2016   MCV 89.5 09/25/2016   PLT 264 09/25/2016    BMET    Component Value Date/Time   NA 134 (L) 09/29/2016 0650   K 4.1 09/29/2016 0650   CL 91 (L) 09/25/2016 1041   CO2 23 09/25/2016 1041   GLUCOSE 89 09/29/2016 0650   BUN 41 (H) 09/25/2016 1041   CREATININE 9.15 (H) 09/25/2016 1041   CALCIUM 8.0 (L) 09/25/2016 1041   GFRNONAA 6 (L) 09/25/2016 1041   GFRAA 6 (L) 09/25/2016 1041   CrCl cannot be calculated (Patient's most recent lab result is older than the maximum 21 days allowed.).  COAG Lab Results  Component Value Date   INR 0.93 09/25/2016   INR 0.92 07/21/2016   INR 0.97 06/21/2016    Radiology No results found.  Assessment/Plan 1.  Complication dialysis device with thrombosis AV access:  Patient's left arm fistula is malfunctioning. The patient will undergo angiography and correction of any problems using interventional techniques with the hope of restoring function to the access.  The risks and benefits were described to the patient.  All questions were answered.  The patient agrees to proceed with angiography and intervention. Potassium will be drawn to ensure that it is an appropriate level prior to performing intervention. 2.  End-stage renal disease requiring hemodialysis:  Patient will continue dialysis therapy without further interruption if a successful intervention is not achieved then a tunneled catheter will be placed. Dialysis has already been arranged. 3.  Hypertension:  Patient will continue medical management; nephrology is following no changes in oral medications. 4. Diabetes mellitus:  Glucose will be monitored and oral medications been held this morning once the patient has undergone the patient's procedure po intake will be reinitiated and again Accu-Cheks will be used to  assess the blood glucose level and treat as needed. The patient will be restarted on the patient's usual hypoglycemic regime 5.  Coronary artery disease:  EKG will be monitored. Nitrates will be used if needed. The patient's oral cardiac medications will be continued.    Levora Dredge, MD  12/05/2016 8:09 AM

## 2016-12-05 NOTE — Progress Notes (Signed)
Post Procedure Pt with mild wheezes on expiration. Lung sound clear except exp wheezes.  Dr. Gilda Crease notified and one time dose Duo Neb ordered and given. Po2 sats 96% and above on room air. Wheezes improved post treatment, Po2 unchanged, no SOB or distress.

## 2016-12-05 NOTE — Op Note (Signed)
OPERATIVE NOTE   PROCEDURE: 1. Contrast injection left brachiocephalic fistula  AV access 2. Percutaneous transluminal angioplasty to 8 mm with a Lutonix balloon left brachycephalic fistula peripheral segment 3. Coil embolization of large branch off the cephalic vein  PRE-OPERATIVE DIAGNOSIS: Complication of dialysis access                                                       End Stage Renal Disease  POST-OPERATIVE DIAGNOSIS: same as above   SURGEON: Katha Cabal, M.D.  ANESTHESIA: Conscious sedation was administered under my direct supervision by the interventional radiology RN. IV Versed plus fentanyl were utilized. Continuous ECG, pulse oximetry and blood pressure was monitored throughout the entire procedure.  Conscious sedation was for a total of 37.  ESTIMATED BLOOD LOSS: minimal  FINDING(S): Stricture of the AV graft  SPECIMEN(S):  None  CONTRAST: 25 cc  FLUOROSCOPY TIME: 4.3 minutes  INDICATIONS: Justin Fitzgerald is a 56 y.o. male who  presents with malfunctioning left brachiocephalic AV access.  The patient is scheduled for angiography with possible intervention of the AV access.  The patient is aware the risks include but are not limited to: bleeding, infection, thrombosis of the cannulated access, and possible anaphylactic reaction to the contrast.  The patient acknowledges if the access can not be salvaged a tunneled catheter will be needed and will be placed during this procedure.  The patient is aware of the risks of the procedure and elects to proceed with the angiogram and intervention.  DESCRIPTION: After full informed written consent was obtained, the patient was brought back to the Special Procedure suite and placed supine position.  Appropriate cardiopulmonary monitors were placed.  The left arm was prepped and draped in the standard fashion.  Appropriate timeout is called. The left brachiocephalic fistula  was cannulated with a micropuncture needle.   Cannulation was performed with ultrasound guidance. Ultrasound was placed in a sterile sleeve, the AV access was interrogated and noted to be echolucent and compressible indicating patency. Image was recorded for the permanent record. The puncture is performed under continuous ultrasound visualization.   The microwire was advanced and the needle was exchanged for  a microsheath.  The J-wire was then advanced and a 6 Fr sheath inserted.  Hand injections were completed to image the access from the arterial anastomosis through the entire access.  The central venous structures were also imaged by hand injections.  Based on the images,  3000 units of heparin was given and a wire was negotiated through the strictures within the venous portion of the graft.  Initially a 7 x 6 Lutonix drug eluting balloon was used to angioplasty the stricture in the peripheral segment of the cephalic vein.  Inflation was to 10 atm for 1 minute.  Then an 8 x 6 Lutonix drug eluting balloon was used to angioplasty this same segment. Inflation was to 10 atm 1 minute. Follow-up imaging demonstrated complete resolution of this narrowing with improved flow.  KMP catheter was then advanced over the Magic torque wire and a Magic torque wire was exchanged for a floppy Glidewire. A large tributary of the cephalic vein was then selected and the catheter advanced into this tributary. Floppy Glidewire was removed and a lantern catheter was then advanced to be coils were then deployed into this branch  initially a 6 x 25 standard coil was utilized and then an 8 x 25 standard coil was placed. Follow-up imaging now demonstrated occlusion of this tributary.  Follow-up imaging demonstrates complete resolution of the stricture with rapid flow of contrast through the graft, the central venous anatomy is preserved.  A 4-0 Monocryl purse-string suture was sewn around the sheath.  The sheath was removed and light pressure was applied.  A sterile bandage was  applied to the puncture site.    COMPLICATIONS: None  CONDITION: Justin Fitzgerald, M.D  [facility] Office: [phone]  12/05/2016 9:42 AM

## 2016-12-18 ENCOUNTER — Ambulatory Visit (INDEPENDENT_AMBULATORY_CARE_PROVIDER_SITE_OTHER): Payer: Medicare Other

## 2016-12-18 ENCOUNTER — Encounter (INDEPENDENT_AMBULATORY_CARE_PROVIDER_SITE_OTHER): Payer: Self-pay | Admitting: Vascular Surgery

## 2016-12-18 ENCOUNTER — Ambulatory Visit (INDEPENDENT_AMBULATORY_CARE_PROVIDER_SITE_OTHER): Payer: Medicare Other | Admitting: Vascular Surgery

## 2016-12-18 VITALS — BP 96/60 | HR 86 | Resp 17 | Wt 318.0 lb

## 2016-12-18 DIAGNOSIS — I739 Peripheral vascular disease, unspecified: Secondary | ICD-10-CM | POA: Diagnosis not present

## 2016-12-18 DIAGNOSIS — N186 End stage renal disease: Secondary | ICD-10-CM

## 2016-12-18 DIAGNOSIS — E782 Mixed hyperlipidemia: Secondary | ICD-10-CM | POA: Diagnosis not present

## 2016-12-18 DIAGNOSIS — T829XXS Unspecified complication of cardiac and vascular prosthetic device, implant and graft, sequela: Secondary | ICD-10-CM

## 2016-12-18 DIAGNOSIS — I639 Cerebral infarction, unspecified: Secondary | ICD-10-CM

## 2016-12-18 NOTE — Progress Notes (Signed)
MRN : [id number]  Justin Fitzgerald is a 56 y.o. ([birth year]) male who presents with chief complaint of No chief complaint on file. [name]  History of Present Illness: The patient returns to the office for followup status post intervention of the dialysis access  on 12/05/2016: 1. Contrast injection left brachiocephalic fistula  AV access 2. Percutaneous transluminal angioplasty to 8 mm with a Lutonix  3. Balloon left brachycephalic fistula peripheral segment Coil embolization of large branch off the cephalic vein   Following the intervention the access function has significantly improved, with better flow rates and improved KT/V. The patient has not been experiencing increased bleeding times following decannulation and the patient denies increased recirculation. The patient denies an increase in arm swelling. At the present time the patient denies hand pain.  The patient denies amaurosis fugax or recent TIA symptoms. There are no recent neurological changes noted. The patient denies claudication symptoms or rest pain symptoms. The patient denies history of DVT, PE or superficial thrombophlebitis. The patient denies recent episodes of angina or shortness of breath.    No outpatient prescriptions have been marked as taking for the 12/18/16 encounter (Appointment) with Gilda Crease, Latina Craver, MD.    Past Medical History:  Diagnosis Date  . CHF (congestive heart failure) (HCC)   . COPD (chronic obstructive pulmonary disease) (HCC)   . Dialysis patient (HCC)    Tues. Thurs. Sat.  [name] Dyspnea    with exertion  . ESRD (end stage renal disease) on dialysis (HCC)    "TTS; [facility]" (06/22/2016)  . History of gout   . Hyperlipidemia   . Hypertension   . Peripheral vascular disease (HCC)   . Stroke (HCC) 06/20/2016   denies residual on 06/22/2016  . Type II diabetes mellitus (HCC)    diet control  . Umbilical hernia     Past Surgical History:  Procedure Laterality Date  . A/V  FISTULAGRAM Left 12/05/2016   Procedure: A/V Fistulagram;  Surgeon: Renford Dills, MD;  Location: [facility] INVASIVE CV LAB;  Service: Cardiovascular;  Laterality: Left;  . AV FISTULA PLACEMENT Left 07/28/2016   Procedure: ARTERIOVENOUS (AV) FISTULA CREATION;  Surgeon: Renford Dills, MD;  Location: [facility] ORS;  Service: Vascular;  Laterality: Left;  . CARDIAC CATHETERIZATION    . DIALYSIS/PERMA CATHETER INSERTION N/A 06/28/2016   Procedure: Dialysis/Perma Catheter Insertion;  Surgeon: Renford Dills, MD;  Location: [facility] INVASIVE CV LAB;  Service: Cardiovascular;  Laterality: N/A;  . DIALYSIS/PERMA CATHETER INSERTION N/A 08/25/2016   Procedure: Dialysis/Perma Catheter Insertion;  Surgeon: Annice Needy, MD;  Location: [facility] INVASIVE CV LAB;  Service: Cardiovascular;  Laterality: N/A;  . DIALYSIS/PERMA CATHETER INSERTION N/A 09/08/2016   Procedure: Dialysis/Perma Catheter Insertion;  Surgeon: Renford Dills, MD;  Location: [facility] INVASIVE CV LAB;  Service: Cardiovascular;  Laterality: N/A;  . EXTERIORIZATION OF A CONTINUOUS AMBULATORY PERITONEAL DIALYSIS CATHETER    . FRACTURE SURGERY    . IR GENERIC HISTORICAL  08/07/2016   IR VENIPUNCTURE 66YRS/OLDER BY MD 08/07/2016 Jolaine Click, MD [facility]-INTER[facility] RAD  . IR GENERIC HISTORICAL  08/07/2016   IR US GUIDE VASC ACCESS RIGHT 08/07/2016 Jolaine Click, MD [facility]  . PERIPHERAL VASCULAR CATHETERIZATION N/A 03/08/2016   Procedure: Dialysis/Perma Catheter Insertion;  Surgeon: Renford Dills, MD;  Location: [facility] INVASIVE CV LAB;  Service: Cardiovascular;  Laterality: N/A;  . PERIPHERAL VASCULAR CATHETERIZATION N/A 04/20/2016   Procedure: Dialysis/Perma Catheter Removal;  Surgeon: Annice Needy, MD;  Location: [facility] INVASIVE  CV LAB;  Service: Cardiovascular;  Laterality: N/A;  . PERIPHERAL VASCULAR CATHETERIZATION N/A 05/25/2016   Procedure: Dialysis/Perma Catheter Insertion;  Surgeon: Annice Needy, MD;  Location: [facility] INVASIVE CV LAB;  Service: Cardiovascular;   Laterality: N/A;  . REMOVAL OF A DIALYSIS CATHETER N/A 09/29/2016   Procedure: REMOVAL OF A DIALYSIS CATHETER ( PERITONEAL DIALYSIS CATH );  Surgeon: Renford Dills, MD;  Location: [facility] ORS;  Service: Vascular;  Laterality: N/A;  . RIGHT/LEFT HEART CATH AND CORONARY ANGIOGRAPHY N/A 06/23/2016   Procedure: Right/Left Heart Cath and Coronary Angiography;  Surgeon: [name], MD;  Location: [facility] INVASIVE CV LAB;  Service: Cardiovascular;  Laterality: N/A;  . TEE WITHOUT CARDIOVERSION N/A 06/26/2016   Procedure: TRANSESOPHAGEAL ECHOCARDIOGRAM (TEE);  Surgeon: Pricilla Riffle, MD;  Location: [facility] ENDOSCOPY;  Service: Cardiovascular;  Laterality: N/A;  . WRIST FRACTURE SURGERY Right ~ 2000   "got 8 screws in there"    Social History Social History  Substance Use Topics  . Smoking status: Current Every Day Smoker    Packs/day: 0.50    Years: 40.00    Types: Cigarettes  . Smokeless tobacco: Former User    Types: Chew     Comment: 06/22/2016 "quit chewing when I was 59 or 56 years old"  . Alcohol use No    Family History Family History  Problem Relation Age of Onset  . Hypertension Mother   . CAD Mother     Allergies  Allergen Reactions  . Ramipril Shortness Of Breath  . Aspirin Other (See Comments)    kidney disease     REVIEW OF SYSTEMS (Negative unless checked)  Constitutional: [] Weight loss  [] Fever  [] Chills Cardiac: [] Chest pain   [] Chest pressure   [] Palpitations   [] Shortness of breath when laying flat   [] Shortness of breath with exertion. Vascular:  [] Pain in legs with walking   [] Pain in legs at rest  [] History of DVT   [] Phlebitis   [] Swelling in legs   [] Varicose veins   [] Non-healing ulcers Pulmonary:   [] Uses home oxygen   [] Productive cough   [] Hemoptysis   [] Wheeze  [] COPD   [] Asthma Neurologic:  [] Dizziness   [] Seizures   [] History of stroke   [] History of TIA  [] Aphasia   [] Vissual changes   [] Weakness or numbness in arm   [] Weakness or numbness in  leg Musculoskeletal:   [] Joint swelling   [] Joint pain   [] Low back pain Hematologic:  [] Easy bruising  [] Easy bleeding   [] Hypercoagulable state   [] Anemic Gastrointestinal:  [] Diarrhea   [] Vomiting  [] Gastroesophageal reflux/heartburn   [] Difficulty swallowing. Genitourinary:  [x] Chronic kidney disease   [] Difficult urination  [] Frequent urination   [] Blood in urine Skin:  [] Rashes   [] Ulcers  Psychological:  [] History of anxiety   []  History of major depression.  Physical Examination  There were no vitals filed for this visit. There is no height or weight on file to calculate BMI. Gen: WD/WN, NAD Head: [place]/AT, No temporalis wasting.  Ear/Nose/Throat: Hearing grossly intact, nares w/o erythema or drainage Eyes: PER, EOMI, sclera nonicteric.  Neck: Supple, no large masses.   Pulmonary:  Good air movement, no audible wheezing bilaterally, no use of accessory muscles.  Cardiac: RRR, no JVD Vascular: left brachial cephalic fistula with good thrill and good bruit Vessel Right Left  Radial Palpable Palpable  Ulnar Palpable Palpable  Brachial Palpable Palpable  Gastrointestinal: Non-distended. No guarding/no peritoneal signs.  Musculoskeletal: M/S 5/5 throughout.  No deformity or atrophy.  Neurologic: CN 2-12 intact. Symmetrical.  Speech is fluent. Motor exam as listed above. Psychiatric: Judgment intact, Mood & affect appropriate for pt's clinical situation. Dermatologic: No rashes or ulcers noted.  No changes consistent with cellulitis. Lymph : No lichenification or skin changes of chronic lymphedema.  CBC Lab Results  Component Value Date   WBC 7.2 09/25/2016   HGB 12.6 (L) 09/29/2016   HCT 37.0 (L) 09/29/2016   MCV 89.5 09/25/2016   PLT 264 09/25/2016    BMET    Component Value Date/Time   NA 134 (L) 09/29/2016 0650   K 4.1 09/29/2016 0650   CL 91 (L) 09/25/2016 1041   CO2 23 09/25/2016 1041   GLUCOSE 89 09/29/2016 0650   BUN 41 (H) 09/25/2016 1041   CREATININE 9.15  (H) 09/25/2016 1041   CALCIUM 8.0 (L) 09/25/2016 1041   GFRNONAA 6 (L) 09/25/2016 1041   GFRAA 6 (L) 09/25/2016 1041   CrCl cannot be calculated (Patient's most recent lab result is older than the maximum 21 days allowed.).  COAG Lab Results  Component Value Date   INR 0.93 09/25/2016   INR 0.92 07/21/2016   INR 0.97 06/21/2016    Radiology No results found.  Assessment/Plan 1. Complication from renal dialysis device, sequela Recommend:  The patient is doing well and currently has adequate dialysis access. The patient's dialysis center is not reporting any access issues. Flow pattern is stable when compared to the prior ultrasound.  The patient should have a duplex ultrasound of the dialysis access in 6 months.  The patient will follow-up with me in the office after each ultrasound    - VAS US DUPLEX DIALYSIS ACCESS (AVF,AVG); Future  2. End stage renal disease (HCC) See #1  3. Peripheral vascular disease (HCC)  Recommend:  The patient has evidence of atherosclerosis of the lower extremities with claudication.  The patient does not voice lifestyle limiting changes at this point in time.  Noninvasive studies do not suggest clinically significant change.  No invasive studies, angiography or surgery at this time The patient should continue walking and begin a more formal exercise program.  The patient should continue antiplatelet therapy and aggressive treatment of the lipid abnormalities  No changes in the patient's medications at this time  The patient should continue wearing graduated compression socks 10-15 mmHg strength to control the mild edema.    4. Mixed hyperlipidemia Continue statin as ordered and reviewed, no changes at this time     Levora Dredge, MD  12/18/2016 12:00 PM

## 2016-12-19 ENCOUNTER — Encounter (INDEPENDENT_AMBULATORY_CARE_PROVIDER_SITE_OTHER): Payer: Self-pay

## 2016-12-20 ENCOUNTER — Encounter (INDEPENDENT_AMBULATORY_CARE_PROVIDER_SITE_OTHER): Payer: Self-pay

## 2016-12-20 ENCOUNTER — Other Ambulatory Visit (INDEPENDENT_AMBULATORY_CARE_PROVIDER_SITE_OTHER): Payer: Self-pay

## 2016-12-24 NOTE — Progress Notes (Signed)
Cardiology Office Note  Date:  12/25/2016   ID:  Justin Fitzgerald, Justin Fitzgerald 06/20/1960, MRN [id number]  PCP:  Dorothey Baseman, MD   Chief Complaint  Patient presents with  . other    3 month follow up. Pt. c/o shortness of breath with little exertion.     HPI:  Justin Fitzgerald is a 56 y.o. male with history of  ESRD on PD for at least 9 years recently started with HD,  Remote peritonitis infection, in hospital on ABX chronic combined systolic and diastolic heart failure with EF of 20%, admission in early 06/2016,  TEE With EF 35%, Nonischemic cardiomyopathy February 2018 hypertension,  PVD,  diabetes mellitus,  Still smoking, started young recent stroke in 2/18  who presents for follow-up of his stroke, chronic diastolic and systolic CHF   feels well, some fluid, Makes some urine HD on Tues, Thurs, sat Dry weight 140 kg dry weight Long discussion today concerning previous aneurysm or abscess near his aortic valve No repeat imaging since previous CT scan March 2018 He continues to smoke one half pack per day Leg swelling stable, worse on the left  EKG personally reviewed by myself on todays visit Shows normal sinus rhythm with rate 78 bpm consider old anteroseptal MI, possible inferior MI  Other past medical history reviewed Recent hospitalization at [facility] for stroke February 2018 Hospital records reviewed as below transesophageal echo/TEE  ejection fraction 35% "walled off regiion next to aorta between left and noncoronary cusps of aortic valve that probably represents remote abscess. There does not appear to be a communication with the aorta. "   CT cardiac study Poor venous access, inadequate opacification of aortic root Moderately calcified aortic atherosclerosis 12 x 19 mm cavity behind non coronary cusp/ and [place]/LC fissure that may represent a sinus of Valsalva aneurysm with possible contained rupture vs wall off abscess.  Recommendation was for  revisualization with TEE at 3 months  Cardiac catheterization study reviewed Heavily calcified coronary arteries with chronic occlusion of the first OM branch of the circumflex, moderate diffuse RCA stenosis, and mild nonobstructive LAD stenosis Normal right heart hemodynamics and normal LVEDP Known severe LV systolic dysfunction Medical management was recommended  Lab work reviewed previously by myself HBA1C 5.6 Total chol 170,  LDL 98  Reports that he feels well, occasional shortness of breath HD every Tuesday, Thursday, and Saturday Continues to smoke Takes Lasix 160 mg twice a day Does not weigh himself  Other past medical history reviewed  admitted to [facility] in late December 2017 for acute hypoxic respiratory failure requiring oxygen supplementation in the setting of COPD exacerbation. treated with IV steroids and nebulizers and antibiotics. complicated with peritonitis requiring antibiotic therapy.  mildly elevated troponin  felt to be secondary to demand ischemia    admitted June 21 2016 with stroke after developing left facial droop and slurring of his speech   MRI brain demonstrated right MCA territory infarct felt to be embolic  Lower extremity dopplers were negative for DVT.  Echo showed newly diagnosed systolic heart failure with EF of 20-25%, diffuse hypokinesis, G1DD, mild aortic stenosis, aortic root mildly dilated, mildly to moderately calcified mitral annulus, left atrium moderately dilated.    30 day event monitor   asymptomatic NSVT while in the hospital, beta blocker was titrated as tolerated.   Right and left heart catheterization to 06/23/2016: Mid LAD 40% stenosed with calcified lesion, diffuse calcific LAD stenosis, nonobstructive. OM1 CTO. Proximal RCA 60% stenosed, mid to distal  RCA 50% stenosed.     PMH:   has a past medical history of CHF (congestive heart failure) (HCC); COPD (chronic obstructive pulmonary disease) (HCC); Dialysis patient [facility]);  Dyspnea; ESRD (end stage renal disease) on dialysis (HCC); History of gout; Hyperlipidemia; Hypertension; Peripheral vascular disease (HCC); Stroke [facility]) (06/20/2016); Type II diabetes mellitus (HCC); and Umbilical hernia.  PSH:    Past Surgical History:  Procedure Laterality Date  . A/V FISTULAGRAM Left 12/05/2016   Procedure: A/V Fistulagram;  Surgeon: Renford Dills, MD;  [facility]: [facility] INVASIVE CV LAB;  Service: Cardiovascular;  Laterality: Left;  . AV FISTULA PLACEMENT Left 07/28/2016   Procedure: ARTERIOVENOUS (AV) FISTULA CREATION;  Surgeon: Renford Dills, MD;  Location: [facility] ORS;  Service: Vascular;  Laterality: Left;  . CARDIAC CATHETERIZATION    . DIALYSIS/PERMA CATHETER INSERTION N/A 06/28/2016   Procedure: Dialysis/Perma Catheter Insertion;  Surgeon: Renford Dills, MD;  Location: [facility] INVASIVE CV LAB;  Service: Cardiovascular;  Laterality: N/A;  . DIALYSIS/PERMA CATHETER INSERTION N/A 08/25/2016   Procedure: Dialysis/Perma Catheter Insertion;  Surgeon: Annice Needy, MD;  Location: [facility] INVASIVE CV LAB;  Service: Cardiovascular;  Laterality: N/A;  . DIALYSIS/PERMA CATHETER INSERTION N/A 09/08/2016   Procedure: Dialysis/Perma Catheter Insertion;  Surgeon: Renford Dills, MD;  Location: [facility] INVASIVE CV LAB;  Service: Cardiovascular;  Laterality: N/A;  . EXTERIORIZATION OF A CONTINUOUS AMBULATORY PERITONEAL DIALYSIS CATHETER    . FRACTURE SURGERY    . IR GENERIC HISTORICAL  08/07/2016   IR VENIPUNCTURE 5YRS/OLDER BY MD 08/07/2016 Jolaine Click, MD [facility]-INTERV RAD  . IR GENERIC HISTORICAL  08/07/2016   IR US GUIDE VASC ACCESS RIGHT 08/07/2016 Jolaine Click, MD [facility]-INTERV RAD  . PERIPHERAL VASCULAR CATHETERIZATION N/A 03/08/2016   Procedure: Dialysis/Perma Catheter Insertion;  Surgeon: Renford Dills, MD;  Location: [facility] INVASIVE CV LAB;  Service: Cardiovascular;  Laterality: N/A;  . PERIPHERAL VASCULAR CATHETERIZATION N/A 04/20/2016   Procedure: Dialysis/Perma Catheter Removal;   Surgeon: Annice Needy, MD;  Location: [facility] INVASIVE CV LAB;  Service: Cardiovascular;  Laterality: N/A;  . PERIPHERAL VASCULAR CATHETERIZATION N/A 05/25/2016   Procedure: Dialysis/Perma Catheter Insertion;  Surgeon: Annice Needy, MD;  Location: [facility] INVASIVE CV LAB;  Service: Cardiovascular;  Laterality: N/A;  . REMOVAL OF A DIALYSIS CATHETER N/A 09/29/2016   Procedure: REMOVAL OF A DIALYSIS CATHETER ( PERITONEAL DIALYSIS CATH );  Surgeon: Renford Dills, MD;  Location: [facility] ORS;  Service: Vascular;  Laterality: N/A;  . RIGHT/LEFT HEART CATH AND CORONARY ANGIOGRAPHY N/A 06/23/2016   Procedure: Right/Left Heart Cath and Coronary Angiography;  Surgeon: [name], MD;  Location: [facility] INVASIVE CV LAB;  Service: Cardiovascular;  Laterality: N/A;  . TEE WITHOUT CARDIOVERSION N/A 06/26/2016   Procedure: TRANSESOPHAGEAL ECHOCARDIOGRAM (TEE);  Surgeon: Pricilla Riffle, MD;  Location: [facility] ENDOSCOPY;  Service: Cardiovascular;  Laterality: N/A;  . WRIST FRACTURE SURGERY Right ~ 2000   "got 8 screws in there"    Current Outpatient Prescriptions  Medication Sig Dispense Refill  . acetaminophen (TYLENOL) 500 MG tablet Take 1,000 mg by mouth every 6 (six) hours as needed for mild pain or headache.    [name] aspirin EC 81 MG EC tablet Take 1 tablet (81 mg total) by mouth daily. 90 tablet 3  . atorvastatin (LIPITOR) 80 MG tablet Take 1 tablet (80 mg total) by mouth daily at 6 PM. 60 tablet 1  . beclomethasone (QVAR) 40 MCG/ACT inhaler Inhale 1 puff into the lungs 2 (two) times daily. 1 Inhaler 0  .  carvedilol (COREG) 3.125 MG tablet Take 1 tablet (3.125 mg total) by mouth 2 (two) times daily with a meal. 60 tablet 1  . cinacalcet (SENSIPAR) 30 MG tablet Take 4 tablets (120 mg total) by mouth daily with supper. (Patient taking differently: Take 30 mg by mouth daily with supper. ) 120 tablet 0  . cinacalcet (SENSIPAR) 90 MG tablet Take 90 mg by mouth daily with supper.    . clopidogrel (PLAVIX) 75 MG tablet Take 75 mg by  mouth daily.    [name] doxycycline (VIBRA-TABS) 100 MG tablet     . ezetimibe (ZETIA) 10 MG tablet Take 1 tablet (10 mg total) by mouth daily. 90 tablet 3  . folic acid-vitamin b complex-vitamin c-selenium-zinc (DIALYVITE) 3 MG TABS tablet Take 1 tablet by mouth Every Tuesday,Thursday,and Saturday with dialysis.    . furosemide (LASIX) 80 MG tablet Take 160 mg by mouth 2 (two) times daily.     [name] gabapentin (NEURONTIN) 300 MG capsule Take 300 mg by mouth at bedtime.     [name] HYDROcodone-acetaminophen (NORCO) 5-325 MG tablet Take 1-2 tablets by mouth every 6 (six) hours as needed for moderate pain or severe pain. 50 tablet 0  . sevelamer carbonate (RENVELA) 2.4 g PACK Take 2.4 g by mouth 3 (three) times daily with meals. (Patient taking differently: Take 2.4 g by mouth daily. WITH A SNACK) 90 each 0  . SPIRIVA HANDIHALER 18 MCG inhalation capsule Place 18 mcg into inhaler and inhale daily.      No current facility-administered medications for this visit.      Allergies:   Ramipril and Aspirin   Social History:  The patient  reports that he has been smoking Cigarettes.  He has a 20.00 pack-year smoking history. He has quit using smokeless tobacco. His smokeless tobacco use included Chew. He reports that he does not drink alcohol or use drugs.   Family History:   family history includes CAD in his mother; Hypertension in his mother.    Review of Systems: Review of Systems  Constitutional: Negative.   Respiratory: Positive for shortness of breath.   Cardiovascular: Positive for leg swelling.  Gastrointestinal: Negative.   Musculoskeletal: Negative.   Neurological: Negative.   Psychiatric/Behavioral: Negative.   All other systems reviewed and are negative.    PHYSICAL EXAM: VS:  BP (!) 100/58 (BP Location: Right Arm, Patient Position: Sitting, Cuff Size: Large)   Pulse 78   Ht 6' (1.829 m)   Wt (!) 315 lb (142.9 kg)   BMI 42.72 kg/m  , BMI Body mass index is 42.72 kg/m. GEN: Well  nourished, well developed, in no acute distress , Morbidly obese HEENT: normal  Neck: no JVD, carotid bruits, or masses Cardiac: RRR; no murmurs, rubs, or gallops,Nonpitting lower extremity edema  Respiratory:  clear to auscultation bilaterally, normal work of breathing GI: soft, nontender, nondistended, + BS MS: no deformity or atrophy  Skin: warm and dry, no rash Neuro:  Strength and sensation are intact Psych: euthymic mood, full affect    Recent Labs: 06/21/2016: ALT 14 06/23/2016: TSH 2.302 09/25/2016: BUN 41; Creatinine, Ser 9.15; Platelets 264 09/29/2016: Hemoglobin 12.6; Potassium 4.1; Sodium 134    Lipid Panel Lab Results  Component Value Date   CHOL 170 06/22/2016   HDL 35 (L) 06/22/2016   LDLCALC 98 06/22/2016   TRIG 185 (H) 06/22/2016      Wt Readings from Last 3 Encounters:  12/25/16 (!) 315 lb (142.9 kg)  12/18/16 (!) 318 lb (  144.2 kg)  12/05/16 (!) 315 lb (142.9 kg)       ASSESSMENT AND PLAN:  Essential hypertension Blood pressure running low today, hold carvedilol before dialysis  Cerebrovascular accident (CVA) due to embolism of right middle cerebral artery (HCC) 30 day monitor no significant arrhythmia.  on aspirin and Plavix Unable to exclude mural thrombus given his cardiomyopathy  unable to use warfarin as he is on dialysis, hemodialysis  Chronic combined systolic and diastolic congestive heart failure (HCC) On high dose Lasix twice a day with hemodialysis Dry weight 140 kg  Peripheral vascular disease (HCC) Seen on CT scan, stressed importance of aggressive cholesterol control, smoking cessation  Mixed hyperlipidemia We will add zetia with statin Goal LDL <70  Type 2 diabetes mellitus with stage 5 chronic kidney disease not on chronic dialysis, without long-term current use of insulin (HCC) We have encouraged continued exercise, careful diet management in an effort to lose weight. Hemoglobin A1c relatively well-controlled  Acute  respiratory failure with hypoxia and hypercapnia (HCC) Respiratory status is stable Continues to smoke. One half pack per day  does not want any assistance with chantix.   Aortic abscess We will schedule repeat TEE  Long discussion with him concerning indications   Disposition:   F/U  6 months   Total encounter time more than 25 minutes  Greater than 50% was spent in counseling and coordination of care with the patient    Orders Placed This Encounter  Procedures  . EKG 12-Lead     Signed, Dossie Arbour, M.D., Ph.D. 12/25/2016  [facility] [place], [place] [phone]

## 2016-12-25 ENCOUNTER — Encounter: Payer: Self-pay | Admitting: *Deleted

## 2016-12-25 ENCOUNTER — Ambulatory Visit
Admission: RE | Admit: 2016-12-25 | Discharge: 2016-12-25 | Disposition: A | Discharge status: Home / Self Care | Payer: Medicare Other | Source: Ambulatory Visit | Attending: Vascular Surgery | Admitting: Vascular Surgery

## 2016-12-25 ENCOUNTER — Ambulatory Visit (INDEPENDENT_AMBULATORY_CARE_PROVIDER_SITE_OTHER): Payer: BC Managed Care – PPO | Admitting: Cardiovascular Disease

## 2016-12-25 ENCOUNTER — Encounter
Admission: RE | Disposition: A | Discharge status: Home / Self Care | Payer: Self-pay | Source: Ambulatory Visit | Attending: Vascular Surgery

## 2016-12-25 ENCOUNTER — Encounter: Payer: Self-pay | Admitting: Cardiovascular Disease

## 2016-12-25 VITALS — BP 100/58 | HR 78 | Ht 72.0 in | Wt 315.0 lb

## 2016-12-25 DIAGNOSIS — N186 End stage renal disease: Secondary | ICD-10-CM

## 2016-12-25 DIAGNOSIS — E1122 Type 2 diabetes mellitus with diabetic chronic kidney disease: Secondary | ICD-10-CM | POA: Insufficient documentation

## 2016-12-25 DIAGNOSIS — E785 Hyperlipidemia, unspecified: Secondary | ICD-10-CM | POA: Insufficient documentation

## 2016-12-25 DIAGNOSIS — E782 Mixed hyperlipidemia: Secondary | ICD-10-CM

## 2016-12-25 DIAGNOSIS — I509 Heart failure, unspecified: Secondary | ICD-10-CM | POA: Insufficient documentation

## 2016-12-25 DIAGNOSIS — J9601 Acute respiratory failure with hypoxia: Secondary | ICD-10-CM

## 2016-12-25 DIAGNOSIS — J9602 Acute respiratory failure with hypercapnia: Secondary | ICD-10-CM

## 2016-12-25 DIAGNOSIS — Z452 Encounter for adjustment and management of vascular access device: Secondary | ICD-10-CM | POA: Diagnosis not present

## 2016-12-25 DIAGNOSIS — Z888 Allergy status to other drugs, medicaments and biological substances status: Secondary | ICD-10-CM | POA: Diagnosis not present

## 2016-12-25 DIAGNOSIS — Z8249 Family history of ischemic heart disease and other diseases of the circulatory system: Secondary | ICD-10-CM | POA: Diagnosis not present

## 2016-12-25 DIAGNOSIS — I132 Hypertensive heart and chronic kidney disease with heart failure and with stage 5 chronic kidney disease, or end stage renal disease: Secondary | ICD-10-CM | POA: Insufficient documentation

## 2016-12-25 DIAGNOSIS — I63411 Cerebral infarction due to embolism of right middle cerebral artery: Secondary | ICD-10-CM

## 2016-12-25 DIAGNOSIS — E119 Type 2 diabetes mellitus without complications: Secondary | ICD-10-CM

## 2016-12-25 DIAGNOSIS — Z886 Allergy status to analgesic agent status: Secondary | ICD-10-CM | POA: Insufficient documentation

## 2016-12-25 DIAGNOSIS — I739 Peripheral vascular disease, unspecified: Secondary | ICD-10-CM | POA: Diagnosis not present

## 2016-12-25 DIAGNOSIS — J449 Chronic obstructive pulmonary disease, unspecified: Secondary | ICD-10-CM | POA: Insufficient documentation

## 2016-12-25 DIAGNOSIS — N185 Chronic kidney disease, stage 5: Secondary | ICD-10-CM

## 2016-12-25 DIAGNOSIS — I639 Cerebral infarction, unspecified: Secondary | ICD-10-CM

## 2016-12-25 DIAGNOSIS — Z955 Presence of coronary angioplasty implant and graft: Secondary | ICD-10-CM | POA: Diagnosis not present

## 2016-12-25 DIAGNOSIS — Z9889 Other specified postprocedural states: Secondary | ICD-10-CM | POA: Insufficient documentation

## 2016-12-25 DIAGNOSIS — F17210 Nicotine dependence, cigarettes, uncomplicated: Secondary | ICD-10-CM | POA: Insufficient documentation

## 2016-12-25 DIAGNOSIS — E1151 Type 2 diabetes mellitus with diabetic peripheral angiopathy without gangrene: Secondary | ICD-10-CM | POA: Insufficient documentation

## 2016-12-25 DIAGNOSIS — Z992 Dependence on renal dialysis: Secondary | ICD-10-CM | POA: Diagnosis not present

## 2016-12-25 DIAGNOSIS — Z8673 Personal history of transient ischemic attack (TIA), and cerebral infarction without residual deficits: Secondary | ICD-10-CM | POA: Insufficient documentation

## 2016-12-25 DIAGNOSIS — I10 Essential (primary) hypertension: Secondary | ICD-10-CM | POA: Diagnosis not present

## 2016-12-25 HISTORY — PX: DIALYSIS/PERMA CATHETER REMOVAL: CATH118289

## 2016-12-25 SURGERY — DIALYSIS/PERMA CATHETER REMOVAL
Anesthesia: Moderate Sedation

## 2016-12-25 SURGICAL SUPPLY — 1 items: TRAY LACERAT/PLASTIC (MISCELLANEOUS) ×3 IMPLANT

## 2016-12-25 NOTE — Op Note (Signed)
Operative Note     Preoperative diagnosis:   1. ESRD with functional permanent access  Postoperative diagnosis:  1. ESRD with functional permanent access  Procedure:  Removal of right femoral Permcath  Surgeon:  Festus Barren, MD  Anesthesia:  Local  EBL:  Minimal  Indication for the Procedure:  The patient has a functional permanent dialysis access and no longer needs their permcath.  This can be removed.  Risks and benefits are discussed and informed consent is obtained.  Description of the Procedure:  The patient's right groin, thigh and existing catheter were sterilely prepped and draped. The area around the catheter was anesthetized copiously with 1% lidocaine. The catheter was dissected out with curved hemostats until the cuff was freed from the surrounding fibrous sheath. The fiber sheath was transected, and the catheter was then removed in its entirety using gentle traction. Pressure was held and sterile dressings were placed. The patient tolerated the procedure well and was taken to the recovery room in stable condition.     Festus Barren  12/25/2016, 1:54 PM This note was created with Dragon Medical transcription system. Any errors in dictation are purely unintentional.

## 2016-12-25 NOTE — Discharge Instructions (Signed)
Incision Care, Adult °An incision is a cut that a doctor makes in your skin for surgery (for a procedure). Most times, these cuts are closed after surgery. Your cut from surgery may be closed with stitches (sutures), staples, skin glue, or skin tape (adhesive strips). You may need to return to your doctor to have stitches or staples taken out. This may happen many days or many weeks after your surgery. The cut needs to be well cared for so it does not get infected. °How to care for your cut °Cut care °· Follow instructions from your doctor about how to take care of your cut. Make sure you: °? Wash your hands with soap and water before you change your bandage (dressing). If you cannot use soap and water, use hand sanitizer. °? Change your bandage as told by your doctor. °? Leave stitches, skin glue, or skin tape in place. They may need to stay in place for 2 weeks or longer. If tape strips get loose and curl up, you may trim the loose edges. Do not remove tape strips completely unless your doctor says it is okay. °· Check your cut area every day for signs of infection. Check for: °? More redness, swelling, or pain. °? More fluid or blood. °? Warmth. °? Pus or a bad smell. °· Ask your doctor how to clean the cut. This may include: °? Using mild soap and water. °? Using a clean towel to pat the cut dry after you clean it. °? Putting a cream or ointment on the cut. Do this only as told by your doctor. °? Covering the cut with a clean bandage. °· Ask your doctor when you can leave the cut uncovered. °· Do not take baths, swim, or use a hot tub until your doctor says it is okay. Ask your doctor if you can take showers. You may only be allowed to take sponge baths for bathing. °Medicines °· If you were prescribed an antibiotic medicine, cream, or ointment, take the antibiotic or put it on the cut as told by your doctor. Do not stop taking or putting on the antibiotic even if your condition gets better. °· Take  over-the-counter and prescription medicines only as told by your doctor. °General instructions °· Limit movement around your cut. This helps healing. °? Avoid straining, lifting, or exercise for the first month, or for as long as told by your doctor. °? Follow instructions from your doctor about going back to your normal activities. °? Ask your doctor what activities are safe. °· Protect your cut from the sun when you are outside for the first 6 months, or for as long as told by your doctor. Put on sunscreen around the scar or cover up the scar. °· Keep all follow-up visits as told by your doctor. This is important. °Contact a doctor if: °· Your have more redness, swelling, or pain around the cut. °· You have more fluid or blood coming from the cut. °· Your cut feels warm to the touch. °· You have pus or a bad smell coming from the cut. °· You have a fever or shaking chills. °· You feel sick to your stomach (nauseous) or you throw up (vomit). °· You are dizzy. °· Your stitches or staples come undone. °Get help right away if: °· You have a red streak coming from your cut. °· Your cut bleeds through the bandage and the bleeding does not stop with gentle pressure. °· The edges of your cut open   up and separate. °· You have very bad (severe) pain. °· You have a rash. °· You are confused. °· You pass out (faint). °· You have trouble breathing and you have a fast heartbeat. °This information is not intended to replace advice given to you by your health care provider. Make sure you discuss any questions you have with your health care provider. °Document Released: 07/24/2011 Document Revised: 01/07/2016 Document Reviewed: 01/07/2016 °Elsevier Interactive Patient Education © 2017 Elsevier Inc[employer] ° °

## 2016-12-25 NOTE — Patient Instructions (Addendum)
Medication Instructions:   No medication changes made  Ask Dr. 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 During a TEE, sound waves are used to create images of your heart. It provides your doctor with information about the size and shape of your heart and how well your heart's chambers and valves are working. In this test, a transducer is attached to the end of a flexible tube that's guided down your throat and into your esophagus (the tube leading from you mouth to your stomach) to get a more detailed image of your heart. You are not awake for the procedure.   Please call when you are ready to schedule, we would do this first thing in the morning. Some possible dates include: Wed, 8/15 Fri, 8/17 Wed 8/22 Wed 8/29  Follow-Up: It was a pleasure seeing you in the office today. Please call us if you have new issues that need to be addressed before your next appt.  [phone]  Your physician wants you to follow-up in: 6 months.  You will receive a reminder letter in the mail two months in advance. If you don't receive a letter, please call our office to schedule the follow-up appointment.  If you need a refill on your cardiac medications before your next appointment, please call your pharmacy.     Transesophageal Echocardiogram Transesophageal echocardiography (TEE) is a picture test of your heart using sound waves. The pictures taken can give very detailed pictures of your heart. This can help your doctor see if there are problems with your heart. TEE can check:  If your heart has blood clots in it.  How well your heart valves are working.  If you have an infection on the inside of your heart.  Some of the major arteries of your heart.  If your heart valve is working after a Psychologist, forensic.  Your heart before  a procedure that uses a shock to your heart to get the rhythm back to normal.  What happens before the procedure?  Do not eat or drink for 6 hours before the procedure or as told by your doctor.  Make plans to have someone drive you home after the procedure. Do not drive yourself home.  An IV tube will be put in your arm. What happens during the procedure?  You will be given a medicine to help you relax (sedative). It will be given through the IV tube.  A numbing medicine will be sprayed or gargled in the back of your throat to help numb it.  The tip of the probe is placed into the back of your mouth. You will be asked to swallow. This helps to pass the probe into your esophagus.  Once the tip of the probe is in the right place, your doctor can take pictures of your heart.  You may feel pressure at the back of your throat. What happens after the procedure?  You will be taken to a recovery area so the sedative can wear off.  Your throat may be sore and scratchy. This will go away slowly over time.  You will go home when you are fully awake and able to swallow liquids.  You should have someone stay with you for the next 24 hours.  Do not drive or operate machinery for the next 24 hours.  This information is not intended to replace advice given to you by your health care provider. Make sure you discuss any questions you have with your health care provider. Document Released: 02/26/2009 Document Revised: 10/07/2015 Document Reviewed: 10/31/2012 Elsevier Interactive Patient Education  [employer].

## 2016-12-25 NOTE — H&P (Signed)
[facility] Admission History & Physical  MRN : [id number]  Justin Fitzgerald is a 56 y.o. (May 04, 1961) male who presents with chief complaint of No chief complaint on file. [name]  History of Present Illness: I am asked to evaluate the patient by the dialysis center. The patient was sent here because they have a nonfunctioning tunneled catheter and a functioning AVF.  The patient reports they're not been any problems with any of their dialysis runs. They are reporting good flows with good parameters at dialysis.  Patient denies pain or tenderness overlying the access.  There is no pain with dialysis.  The patient denies hand pain or finger pain consistent with steal syndrome.  No fevers or chills while on dialysis.   No current facility-administered medications for this encounter.     Past Medical History:  Diagnosis Date  . CHF (congestive heart failure) (HCC)   . COPD (chronic obstructive pulmonary disease) (HCC)   . Dialysis patient (HCC)    Tues. Thurs. Sat.  [name] Dyspnea    with exertion  . ESRD (end stage renal disease) on dialysis (HCC)    "TTS; [facility] [place]" (06/22/2016)  . History of gout   . Hyperlipidemia   . Hypertension   . Peripheral vascular disease (HCC)   . Stroke (HCC) 06/20/2016   denies residual on 06/22/2016  . Type II diabetes mellitus (HCC)    diet control  . Umbilical hernia     Past Surgical History:  Procedure Laterality Date  . A/V FISTULAGRAM Left 12/05/2016   Procedure: A/V Fistulagram;  Surgeon: Renford Dills, MD;  Location: [facility] INVASIVE CV LAB;  Service: Cardiovascular;  Laterality: Left;  . AV FISTULA PLACEMENT Left 07/28/2016   Procedure: ARTERIOVENOUS (AV) FISTULA CREATION;  Surgeon: Renford Dills, MD;  Location: [facility] ORS;  Service: Vascular;  Laterality: Left;  . CARDIAC CATHETERIZATION    . DIALYSIS/PERMA CATHETER INSERTION N/A 06/28/2016   Procedure: Dialysis/Perma Catheter Insertion;  Surgeon: Renford Dills, MD;  Location: [facility] INVASIVE CV LAB;  Service: Cardiovascular;  Laterality: N/A;  . DIALYSIS/PERMA CATHETER INSERTION N/A 08/25/2016   Procedure: Dialysis/Perma Catheter Insertion;  Surgeon: Annice Needy, MD;  Location: [facility] INVASIVE CV LAB;  Service: Cardiovascular;  Laterality: N/A;  . DIALYSIS/PERMA CATHETER INSERTION N/A 09/08/2016   Procedure: Dialysis/Perma Catheter Insertion;  Surgeon: Renford Dills, MD;  Location: [facility] INVASIVE CV LAB;  Service: Cardiovascular;  Laterality: N/A;  . EXTERIORIZATION OF A CONTINUOUS AMBULATORY PERITONEAL DIALYSIS CATHETER    . FRACTURE SURGERY    . IR GENERIC HISTORICAL  08/07/2016   IR VENIPUNCTURE 64YRS/OLDER BY MD 08/07/2016 Jolaine Click, MD [facility]-INTERV RAD  . IR GENERIC HISTORICAL  08/07/2016   IR US GUIDE VASC ACCESS RIGHT 08/07/2016 Jolaine Click, MD [facility]-INTER[facility]  . PERIPHERAL VASCULAR CATHETERIZATION N/A 03/08/2016   Procedure: Dialysis/Perma Catheter Insertion;  Surgeon: Renford Dills, MD;  Location: [facility] INVASIVE CV LAB;  Service: Cardiovascular;  Laterality: N/A;  . PERIPHERAL VASCULAR CATHETERIZATION N/A 04/20/2016   Procedure: Dialysis/Perma Catheter Removal;  Surgeon: Annice Needy, MD;  Location: [facility] INVASIVE CV LAB;  Service: Cardiovascular;  Laterality: N/A;  . PERIPHERAL VASCULAR CATHETERIZATION N/A 05/25/2016   Procedure: Dialysis/Perma Catheter Insertion;  Surgeon: Annice Needy, MD;  Location: [facility] INVASIVE CV LAB;  Service: Cardiovascular;  Laterality: N/A;  . REMOVAL OF A DIALYSIS CATHETER N/A 09/29/2016   Procedure: REMOVAL OF A DIALYSIS CATHETER ( PERITONEAL DIALYSIS CATH );  Surgeon: Renford Dills, MD;  Location: [facility] ORS;  Service: Vascular;  Laterality: N/A;  . RIGHT/LEFT HEART CATH AND CORONARY ANGIOGRAPHY N/A 06/23/2016   Procedure: Right/Left Heart Cath and Coronary Angiography;  Surgeon: [name], MD;  Location: [facility] INVASIVE CV LAB;  Service: Cardiovascular;  Laterality: N/A;  . TEE WITHOUT CARDIOVERSION N/A 06/26/2016    Procedure: TRANSESOPHAGEAL ECHOCARDIOGRAM (TEE);  Surgeon: Pricilla Riffle, MD;  Location: [facility] ENDOSCOPY;  Service: Cardiovascular;  Laterality: N/A;  . WRIST FRACTURE SURGERY Right ~ 2000   "got 8 screws in there"    Social History Social History  Substance Use Topics  . Smoking status: Current Every Day Smoker    Packs/day: 0.50    Years: 40.00    Types: Cigarettes  . Smokeless tobacco: Former User    Types: Chew     Comment: 06/22/2016 "quit chewing when I was 58 or 56 years old"  . Alcohol use No  No IVDU  Family History Family History  Problem Relation Age of Onset  . Hypertension Mother   . CAD Mother     No family history of bleeding or clotting disorders, autoimmune disease or porphyria  Allergies  Allergen Reactions  . Ramipril Shortness Of Breath  . Aspirin Other (See Comments)    kidney disease     REVIEW OF SYSTEMS (Negative unless checked)  Constitutional: [] Weight loss  [] Fever  [] Chills Cardiac: [] Chest pain   [] Chest pressure   [] Palpitations   [] Shortness of breath when laying flat   [] Shortness of breath at rest   [x] Shortness of breath with exertion. Vascular:  [] Pain in legs with walking   [] Pain in legs at rest   [] Pain in legs when laying flat   [] Claudication   [] Pain in feet when walking  [] Pain in feet at rest  [] Pain in feet when laying flat   [] History of DVT   [] Phlebitis   [] Swelling in legs   [] Varicose veins   [] Non-healing ulcers Pulmonary:   [] Uses home oxygen   [] Productive cough   [] Hemoptysis   [] Wheeze  [] COPD   [] Asthma Neurologic:  [] Dizziness  [] Blackouts   [] Seizures   [] History of stroke   [] History of TIA  [] Aphasia   [] Temporary blindness   [] Dysphagia   [] Weakness or numbness in arms   [] Weakness or numbness in legs Musculoskeletal:  [] Arthritis   [] Joint swelling   [] Joint pain   [] Low back pain Hematologic:  [] Easy bruising  [] Easy bleeding   [] Hypercoagulable state   [] Anemic  [] Hepatitis Gastrointestinal:  [] Blood in stool    [] Vomiting blood  [] Gastroesophageal reflux/heartburn   [] Difficulty swallowing. Genitourinary:  [x] Chronic kidney disease   [] Difficult urination  [] Frequent urination  [] Burning with urination   [] Blood in urine Skin:  [] Rashes   [] Ulcers   [] Wounds Psychological:  [] History of anxiety   []  History of major depression.  Physical Examination  There were no vitals filed for this visit. There is no height or weight on file to calculate BMI. Gen: WD/WN, NAD Head: /AT, No temporalis wasting.  Ear/Nose/Throat: Hearing grossly intact, nares w/o erythema or drainage, oropharynx w/o Erythema/Exudate,  Eyes: Conjunctiva clear, sclera non-icteric Neck: Trachea midline.  No JVD.  Pulmonary:  Good air movement, respirations not labored, no use of accessory muscles.  Cardiac: RRR, normal S1, S2. Vascular: thrill in AVF Vessel Right Left  Radial Palpable Palpable           Musculoskeletal: M/S 5/5 throughout.  Extremities without ischemic changes.  No deformity or atrophy.  Neurologic: Sensation  grossly intact in extremities.  Symmetrical.  Speech is fluent. Motor exam as listed above. Psychiatric: Judgment intact, Mood & affect appropriate for pt's clinical situation. Dermatologic: No rashes or ulcers noted.  No cellulitis or open wounds.    CBC Lab Results  Component Value Date   WBC 7.2 09/25/2016   HGB 12.6 (L) 09/29/2016   HCT 37.0 (L) 09/29/2016   MCV 89.5 09/25/2016   PLT 264 09/25/2016    BMET    Component Value Date/Time   NA 134 (L) 09/29/2016 0650   K 4.1 09/29/2016 0650   CL 91 (L) 09/25/2016 1041   CO2 23 09/25/2016 1041   GLUCOSE 89 09/29/2016 0650   BUN 41 (H) 09/25/2016 1041   CREATININE 9.15 (H) 09/25/2016 1041   CALCIUM 8.0 (L) 09/25/2016 1041   GFRNONAA 6 (L) 09/25/2016 1041   GFRAA 6 (L) 09/25/2016 1041   CrCl cannot be calculated (Patient's most recent lab result is older than the maximum 21 days allowed.).  COAG Lab Results  Component Value Date    INR 0.93 09/25/2016   INR 0.92 07/21/2016   INR 0.97 06/21/2016    Radiology No results found.  Assessment/Plan 1.  Complication dialysis device with thrombosis AV access:  Patient's Tunneled catheter is not being used. The patient has an extremity access that is functioning well. Therefore, the patient will undergo removal of the tunneled catheter under local anesthesia.  The risks and benefits were described to the patient.  All questions were answered.  The patient agrees to proceed with angiography and intervention. Potassium will be drawn to ensure that it is an appropriate level prior to performing intervention. 2.  End-stage renal disease requiring hemodialysis:  Patient will continue dialysis therapy without further interruption if a successful intervention is not achieved then a tunneled catheter will be placed. Dialysis has already been arranged. 3.  Hypertension:  Patient will continue medical management; nephrology is following no changes in oral medications. 4. Diabetes mellitus:  Glucose will be monitored and oral medications been held this morning once the patient has undergone the patient's procedure po intake will be reinitiated and again Accu-Cheks will be used to assess the blood glucose level and treat as needed. The patient will be restarted on the patient's usual hypoglycemic regime     Festus Barren, MD  12/25/2016 12:48 PM

## 2016-12-27 ENCOUNTER — Encounter: Payer: Self-pay | Admitting: Vascular Surgery

## 2017-01-17 ENCOUNTER — Telehealth: Payer: Self-pay | Admitting: Cardiovascular Disease

## 2017-01-17 NOTE — Telephone Encounter (Signed)
Spoke with patient and he states that he would like to get his TEE scheduled and got mixed up on the dates. He reports that he saw Dr. Terance Hart today and he urged patient to get this done. Check with scheduling and he states that either next Wednesday or Friday would work for him. Let him know that I would check with Dr. Mariah Milling and then give him a call back with further instructions. He was appreciative for the call and had no further questions at this time.

## 2017-01-17 NOTE — Telephone Encounter (Addendum)
Spoke with patient and scheduled TEE to be done next Friday 01/26/17 at 08:30 AM with arrival time of 07:30AM. Instructed him not to eat or drink anything after midnight the night before and to have someone to drive him home. He verbalized understanding of all instructions, confirmed time, and had no further questions at this time.

## 2017-01-17 NOTE — Telephone Encounter (Signed)
Pt wife calling needing to reschedule TEE   Please call back

## 2017-01-18 NOTE — Telephone Encounter (Signed)
Ok thx, If we can put on schedule board

## 2017-01-23 NOTE — Telephone Encounter (Signed)
Spoke with patient and he was agreeable to reschedule TEE for Friday 02/02/17 at 08:30AM with arrival time of 07:30AM. He was appreciative for the call and had no further questions or concerns at this time.

## 2017-01-24 ENCOUNTER — Telehealth: Payer: Self-pay | Admitting: Cardiovascular Disease

## 2017-01-24 NOTE — Telephone Encounter (Signed)
Left voicemail message for patient to call back about needing to reschedule. Procedure had to be changed from 02/02/17 to 02/07/17 for same time and location so will discuss this with him when he calls back.

## 2017-01-25 NOTE — Telephone Encounter (Signed)
Spoke with patient and made him aware that we rescheduled his TEE for 02/07/17 at 08:30 AM with arrival time of 07:30AM over at the [facility] of the hospital. He was agreeable with this schedule change and had no further questions or concerns at this time.

## 2017-02-06 ENCOUNTER — Other Ambulatory Visit: Payer: Self-pay | Admitting: Cardiovascular Disease

## 2017-02-06 DIAGNOSIS — I359 Nonrheumatic aortic valve disorder, unspecified: Secondary | ICD-10-CM

## 2017-02-07 ENCOUNTER — Ambulatory Visit: Admit: 2017-02-07 | Payer: BC Managed Care – PPO | Admitting: Cardiovascular Disease

## 2017-02-07 ENCOUNTER — Encounter
Admission: RE | Disposition: A | Discharge status: Home / Self Care | Payer: Self-pay | Source: Ambulatory Visit | Attending: Cardiovascular Disease

## 2017-02-07 ENCOUNTER — Telehealth: Payer: Self-pay | Admitting: Cardiovascular Disease

## 2017-02-07 ENCOUNTER — Ambulatory Visit (HOSPITAL_BASED_OUTPATIENT_CLINIC_OR_DEPARTMENT_OTHER)
Admission: RE | Admit: 2017-02-07 | Discharge: 2017-02-07 | Disposition: A | Discharge status: Home / Self Care | Payer: Medicare Other | Source: Ambulatory Visit | Attending: Cardiovascular Disease | Admitting: Cardiovascular Disease

## 2017-02-07 ENCOUNTER — Ambulatory Visit
Admission: RE | Admit: 2017-02-07 | Discharge: 2017-02-07 | Disposition: A | Discharge status: Home / Self Care | Payer: Medicare Other | Source: Ambulatory Visit | Attending: Cardiovascular Disease | Admitting: Cardiovascular Disease

## 2017-02-07 DIAGNOSIS — Z8249 Family history of ischemic heart disease and other diseases of the circulatory system: Secondary | ICD-10-CM | POA: Insufficient documentation

## 2017-02-07 DIAGNOSIS — Z8673 Personal history of transient ischemic attack (TIA), and cerebral infarction without residual deficits: Secondary | ICD-10-CM | POA: Insufficient documentation

## 2017-02-07 DIAGNOSIS — E785 Hyperlipidemia, unspecified: Secondary | ICD-10-CM | POA: Insufficient documentation

## 2017-02-07 DIAGNOSIS — I509 Heart failure, unspecified: Secondary | ICD-10-CM | POA: Diagnosis not present

## 2017-02-07 DIAGNOSIS — I359 Nonrheumatic aortic valve disorder, unspecified: Secondary | ICD-10-CM

## 2017-02-07 DIAGNOSIS — I739 Peripheral vascular disease, unspecified: Secondary | ICD-10-CM | POA: Insufficient documentation

## 2017-02-07 DIAGNOSIS — Z888 Allergy status to other drugs, medicaments and biological substances status: Secondary | ICD-10-CM | POA: Diagnosis not present

## 2017-02-07 DIAGNOSIS — I132 Hypertensive heart and chronic kidney disease with heart failure and with stage 5 chronic kidney disease, or end stage renal disease: Secondary | ICD-10-CM | POA: Diagnosis not present

## 2017-02-07 DIAGNOSIS — Z992 Dependence on renal dialysis: Secondary | ICD-10-CM | POA: Diagnosis not present

## 2017-02-07 DIAGNOSIS — I083 Combined rheumatic disorders of mitral, aortic and tricuspid valves: Secondary | ICD-10-CM | POA: Insufficient documentation

## 2017-02-07 DIAGNOSIS — Z886 Allergy status to analgesic agent status: Secondary | ICD-10-CM | POA: Diagnosis not present

## 2017-02-07 DIAGNOSIS — E1122 Type 2 diabetes mellitus with diabetic chronic kidney disease: Secondary | ICD-10-CM | POA: Insufficient documentation

## 2017-02-07 DIAGNOSIS — N186 End stage renal disease: Secondary | ICD-10-CM | POA: Insufficient documentation

## 2017-02-07 DIAGNOSIS — I351 Nonrheumatic aortic (valve) insufficiency: Secondary | ICD-10-CM | POA: Diagnosis not present

## 2017-02-07 DIAGNOSIS — F17210 Nicotine dependence, cigarettes, uncomplicated: Secondary | ICD-10-CM | POA: Insufficient documentation

## 2017-02-07 DIAGNOSIS — I371 Nonrheumatic pulmonary valve insufficiency: Secondary | ICD-10-CM | POA: Diagnosis not present

## 2017-02-07 HISTORY — PX: TEE WITHOUT CARDIOVERSION: SHX5443

## 2017-02-07 LAB — GLUCOSE, CAPILLARY
Glucose-Capillary: 72 mg/dL (ref 65–99)
Glucose-Capillary: 78 mg/dL (ref 65–99)

## 2017-02-07 SURGERY — ECHOCARDIOGRAM, TRANSESOPHAGEAL
Anesthesia: Moderate Sedation

## 2017-02-07 MED ORDER — FENTANYL CITRATE (PF) 100 MCG/2ML IJ SOLN
INTRAMUSCULAR | Status: AC | PRN
  Administered 2017-02-07 (×4): 25 ug via INTRAVENOUS

## 2017-02-07 MED ORDER — MIDAZOLAM HCL 5 MG/5ML IJ SOLN
INTRAMUSCULAR | Status: AC
  Filled 2017-02-07: qty 5

## 2017-02-07 MED ORDER — SODIUM CHLORIDE 0.9 % IV SOLN
INTRAVENOUS | Status: DC
  Administered 2017-02-07: 1000 mL via INTRAVENOUS

## 2017-02-07 MED ORDER — FENTANYL CITRATE (PF) 100 MCG/2ML IJ SOLN
INTRAMUSCULAR | Status: AC
  Filled 2017-02-07: qty 2

## 2017-02-07 MED ORDER — ASPIRIN 81 MG PO CHEW
81.0000 mg | CHEWABLE_TABLET | ORAL | Status: DC
  Filled 2017-02-07: qty 1

## 2017-02-07 MED ORDER — SODIUM CHLORIDE 0.9 % IV SOLN: INTRAVENOUS | Status: DC

## 2017-02-07 MED ORDER — DEXTROSE 50 % IV SOLN
INTRAVENOUS | Status: AC
  Administered 2017-02-07: 25 mL via INTRAVENOUS
  Filled 2017-02-07: qty 50

## 2017-02-07 MED ORDER — SODIUM CHLORIDE 0.9 % WEIGHT BASED INFUSION: 3.0000 mL/kg/h | INTRAVENOUS | Status: AC

## 2017-02-07 MED ORDER — SODIUM CHLORIDE 0.9 % WEIGHT BASED INFUSION: 1.0000 mL/kg/h | INTRAVENOUS | Status: DC

## 2017-02-07 MED ORDER — MIDAZOLAM HCL 2 MG/2ML IJ SOLN
INTRAMUSCULAR | Status: AC | PRN
  Administered 2017-02-07 (×5): 1 mg via INTRAVENOUS

## 2017-02-07 MED ORDER — LIDOCAINE VISCOUS 2 % MT SOLN
OROMUCOSAL | Status: AC
  Administered 2017-02-07: 09:00:00
  Filled 2017-02-07: qty 15

## 2017-02-07 MED ORDER — BUTAMBEN-TETRACAINE-BENZOCAINE 2-2-14 % EX AERO
INHALATION_SPRAY | CUTANEOUS | Status: AC
  Administered 2017-02-07: 09:00:00
  Filled 2017-02-07: qty 20

## 2017-02-07 MED ORDER — DEXTROSE 50 % IV SOLN
25.0000 mL | Freq: Once | INTRAVENOUS | Status: AC
Start: 2017-02-07 — End: 2017-02-07
  Administered 2017-02-07: 25 mL via INTRAVENOUS

## 2017-02-07 NOTE — Progress Notes (Signed)
*  PRELIMINARY RESULTS* Echocardiogram Echocardiogram Transesophageal has been performed.  Cristela Blue 02/07/2017, 9:19 AM

## 2017-02-07 NOTE — Telephone Encounter (Signed)
Received records request from Disability Determination service forwarded to [facility] for processing.

## 2017-02-08 ENCOUNTER — Encounter: Payer: Self-pay | Admitting: Cardiovascular Disease

## 2017-02-08 NOTE — H&P (Signed)
H&P Addendum, pre-TEE  Patient was seen and evaluated prior to -TEE procedure Symptoms, prior testing details again confirmed with the patient Patient examined, no significant change from prior exam Lab work reviewed in detail personally by myself Patient understands risk and benefit of the procedure, willing to proceed  Signed, Tim Rochelle Nephew, MD, Ph.D [facility]  

## 2017-02-23 ENCOUNTER — Other Ambulatory Visit: Payer: Self-pay | Admitting: *Deleted

## 2017-02-23 DIAGNOSIS — I5042 Chronic combined systolic (congestive) and diastolic (congestive) heart failure: Secondary | ICD-10-CM

## 2017-03-28 ENCOUNTER — Ambulatory Visit (INDEPENDENT_AMBULATORY_CARE_PROVIDER_SITE_OTHER): Payer: Self-pay | Admitting: Vascular Surgery

## 2017-03-28 ENCOUNTER — Encounter (INDEPENDENT_AMBULATORY_CARE_PROVIDER_SITE_OTHER): Payer: Self-pay

## 2017-05-24 DIAGNOSIS — Z0279 Encounter for issue of other medical certificate: Secondary | ICD-10-CM

## 2017-06-25 ENCOUNTER — Encounter (INDEPENDENT_AMBULATORY_CARE_PROVIDER_SITE_OTHER): Payer: Self-pay

## 2017-06-25 ENCOUNTER — Ambulatory Visit (INDEPENDENT_AMBULATORY_CARE_PROVIDER_SITE_OTHER): Payer: Self-pay | Admitting: Vascular Surgery

## 2017-06-26 ENCOUNTER — Encounter: Payer: Self-pay | Admitting: Emergency Medicine

## 2017-06-26 ENCOUNTER — Emergency Department: Payer: Medicare Other

## 2017-06-26 ENCOUNTER — Emergency Department
Admission: EM | Admit: 2017-06-26 | Discharge: 2017-06-26 | Disposition: A | Discharge status: Home / Self Care | Payer: Medicare Other | Attending: Emergency Medicine | Admitting: Emergency Medicine

## 2017-06-26 DIAGNOSIS — Z79899 Other long term (current) drug therapy: Secondary | ICD-10-CM | POA: Diagnosis not present

## 2017-06-26 DIAGNOSIS — E119 Type 2 diabetes mellitus without complications: Secondary | ICD-10-CM | POA: Diagnosis not present

## 2017-06-26 DIAGNOSIS — F17210 Nicotine dependence, cigarettes, uncomplicated: Secondary | ICD-10-CM | POA: Diagnosis not present

## 2017-06-26 DIAGNOSIS — Z7982 Long term (current) use of aspirin: Secondary | ICD-10-CM | POA: Diagnosis not present

## 2017-06-26 DIAGNOSIS — Z992 Dependence on renal dialysis: Secondary | ICD-10-CM | POA: Insufficient documentation

## 2017-06-26 DIAGNOSIS — R0602 Shortness of breath: Secondary | ICD-10-CM

## 2017-06-26 DIAGNOSIS — I132 Hypertensive heart and chronic kidney disease with heart failure and with stage 5 chronic kidney disease, or end stage renal disease: Secondary | ICD-10-CM | POA: Insufficient documentation

## 2017-06-26 DIAGNOSIS — J441 Chronic obstructive pulmonary disease with (acute) exacerbation: Secondary | ICD-10-CM

## 2017-06-26 DIAGNOSIS — I509 Heart failure, unspecified: Secondary | ICD-10-CM | POA: Insufficient documentation

## 2017-06-26 DIAGNOSIS — N186 End stage renal disease: Secondary | ICD-10-CM | POA: Diagnosis not present

## 2017-06-26 LAB — BASIC METABOLIC PANEL
Anion gap: 15 (ref 5–15)
BUN: 38 mg/dL — AB (ref 6–20)
CALCIUM: 8.7 mg/dL — AB (ref 8.9–10.3)
CHLORIDE: 96 mmol/L — AB (ref 101–111)
CO2: 24 mmol/L (ref 22–32)
CREATININE: 6.51 mg/dL — AB (ref 0.61–1.24)
GFR calc Af Amer: 10 mL/min — ABNORMAL LOW (ref >60)
GFR, EST NON AFRICAN AMERICAN: 8 mL/min — AB (ref >60)
Glucose, Bld: 95 mg/dL (ref 65–99)
Potassium: 4.4 mmol/L (ref 3.5–5.1)
SODIUM: 135 mmol/L (ref 135–145)

## 2017-06-26 LAB — CBC
HCT: 37.7 % — ABNORMAL LOW (ref 40.0–52.0)
Hemoglobin: 12.3 g/dL — ABNORMAL LOW (ref 13.0–18.0)
MCH: 29.5 pg (ref 26.0–34.0)
MCHC: 32.7 g/dL (ref 32.0–36.0)
MCV: 90 fL (ref 80.0–100.0)
PLATELETS: 192 10*3/uL (ref 150–440)
RBC: 4.19 MIL/uL — ABNORMAL LOW (ref 4.40–5.90)
RDW: 19.2 % — AB (ref 11.5–14.5)
WBC: 5.6 10*3/uL (ref 3.8–10.6)

## 2017-06-26 LAB — TROPONIN I: TROPONIN I: 0.4 ng/mL — AB (ref <0.03)

## 2017-06-26 MED ORDER — IPRATROPIUM-ALBUTEROL 0.5-2.5 (3) MG/3ML IN SOLN
3.0000 mL | Freq: Once | RESPIRATORY_TRACT | Status: AC
  Administered 2017-06-26: 3 mL via RESPIRATORY_TRACT
  Filled 2017-06-26: qty 3

## 2017-06-26 MED ORDER — ALBUTEROL SULFATE (2.5 MG/3ML) 0.083% IN NEBU: 2.5000 mg | INHALATION_SOLUTION | Freq: Four times a day (QID) | RESPIRATORY_TRACT | 12 refills | Status: AC | PRN

## 2017-06-26 MED ORDER — ALBUTEROL SULFATE (2.5 MG/3ML) 0.083% IN NEBU
5.0000 mg | INHALATION_SOLUTION | Freq: Once | RESPIRATORY_TRACT | Status: AC
  Administered 2017-06-26: 5 mg via RESPIRATORY_TRACT
  Filled 2017-06-26: qty 6

## 2017-06-26 MED ORDER — PREDNISONE 50 MG PO TABS: 50.0000 mg | ORAL_TABLET | Freq: Every day | ORAL | 0 refills | Status: DC

## 2017-06-26 NOTE — ED Notes (Signed)
MD at bedside. 

## 2017-06-26 NOTE — ED Provider Notes (Signed)
[facility] Emergency Department Provider Note   ____________________________________________    I have reviewed the triage vital signs and the nursing notes.   HISTORY  Chief Complaint Shortness of Breath     HPI Justin Fitzgerald is a 57 y.o. male who presents with complaints of shortness of breath.  Patient has CHF, COPD and also receives dialysis Tuesday Thursday Saturday.  Patient had dialysis today.  Patient reports wheezing but no significant cough.  He thinks his COPD is acting up.  No fevers or chills.  No recent travel.  No calf pain or swelling.  Does take Lasix, does not make significant urine.  No diaphoresis nausea or vomiting.  Discussed with his nephrologist who has called him in some prescriptions.   Past Medical History:  Diagnosis Date  . CHF (congestive heart failure) (HCC)   . COPD (chronic obstructive pulmonary disease) (HCC)   . Dialysis patient (HCC)    Tues. Thurs. Sat.  [name] Dyspnea    with exertion  . ESRD (end stage renal disease) on dialysis (HCC)    "TTS; [facility] [place]" (06/22/2016)  . History of gout   . Hyperlipidemia   . Hypertension   . Peripheral vascular disease (HCC)   . Stroke (HCC) 06/20/2016   denies residual on 06/22/2016  . Type II diabetes mellitus (HCC)    diet control  . Umbilical hernia     Patient Active Problem List   Diagnosis Date Noted  . Complication of renal dialysis 07/18/2016  . Hyperlipidemia 07/18/2016  . Chronic combined systolic and diastolic congestive heart failure (HCC)   . CVA (cerebral vascular accident) (HCC) 06/21/2016  . Acute ischemic stroke (HCC) 06/21/2016  . Peripheral vascular disease (HCC) 06/21/2016  . Acute respiratory failure (HCC) 05/11/2016  . Peritonitis (HCC) 05/11/2016  . End stage renal disease (HCC) 03/27/2016  . Complication from renal dialysis device 03/27/2016  . Lymphedema 02/28/2016  . Essential hypertension 02/28/2016  . Type 2 diabetes  mellitus (HCC) 02/28/2016    Past Surgical History:  Procedure Laterality Date  . A/V FISTULAGRAM Left 12/05/2016   Procedure: A/V Fistulagram;  Surgeon: Renford Dills, MD;  Location: [facility] INVASIVE CV LAB;  Service: Cardiovascular;  Laterality: Left;  . AV FISTULA PLACEMENT Left 07/28/2016   Procedure: ARTERIOVENOUS (AV) FISTULA CREATION;  Surgeon: Renford Dills, MD;  Location: [facility] ORS;  Service: Vascular;  Laterality: Left;  . CARDIAC CATHETERIZATION    . DIALYSIS/PERMA CATHETER INSERTION N/A 06/28/2016   Procedure: Dialysis/Perma Catheter Insertion;  Surgeon: Renford Dills, MD;  Location: [facility] INVASIVE CV LAB;  Service: Cardiovascular;  Laterality: N/A;  . DIALYSIS/PERMA CATHETER INSERTION N/A 08/25/2016   Procedure: Dialysis/Perma Catheter Insertion;  Surgeon: Annice Needy, MD;  Location: [facility] INVASIVE CV LAB;  Service: Cardiovascular;  Laterality: N/A;  . DIALYSIS/PERMA CATHETER INSERTION N/A 09/08/2016   Procedure: Dialysis/Perma Catheter Insertion;  Surgeon: Renford Dills, MD;  Location: [facility] INVASIVE CV LAB;  Service: Cardiovascular;  Laterality: N/A;  . DIALYSIS/PERMA CATHETER REMOVAL N/A 12/25/2016   Procedure: DIALYSIS/PERMA CATHETER REMOVAL;  Surgeon: Annice Needy, MD;  Location: [facility] INVASIVE CV LAB;  Service: Cardiovascular;  Laterality: N/A;  . EXTERIORIZATION OF A CONTINUOUS AMBULATORY PERITONEAL DIALYSIS CATHETER    . FRACTURE SURGERY    . IR GENERIC HISTORICAL  08/07/2016   IR VENIPUNCTURE 52YRS/OLDER BY MD 08/07/2016 [facility] Click, MD [facility]INTER[facility]  . IR GENERIC HISTORICAL  08/07/2016   IR US GUIDE VASC ACCESS RIGHT 08/07/2016 Merton Border  Hoss, MD [facility]INTER[facility]  . PERIPHERAL VASCULAR CATHETERIZATION N/A 03/08/2016   Procedure: Dialysis/Perma Catheter Insertion;  Surgeon: Renford Dills, MD;  Location: [facility] INVASIVE CV LAB;  Service: Cardiovascular;  Laterality: N/A;  . PERIPHERAL VASCULAR CATHETERIZATION N/A 04/20/2016   Procedure: Dialysis/Perma Catheter Removal;   Surgeon: Annice Needy, MD;  Location: [facility] INVASIVE CV LAB;  Service: Cardiovascular;  Laterality: N/A;  . PERIPHERAL VASCULAR CATHETERIZATION N/A 05/25/2016   Procedure: Dialysis/Perma Catheter Insertion;  Surgeon: Annice Needy, MD;  Location: [facility] INVASIVE CV LAB;  Service: Cardiovascular;  Laterality: N/A;  . REMOVAL OF A DIALYSIS CATHETER N/A 09/29/2016   Procedure: REMOVAL OF A DIALYSIS CATHETER ( PERITONEAL DIALYSIS CATH );  Surgeon: Renford Dills, MD;  Location: [facility] ORS;  Service: Vascular;  Laterality: N/A;  . RIGHT/LEFT HEART CATH AND CORONARY ANGIOGRAPHY N/A 06/23/2016   Procedure: Right/Left Heart Cath and Coronary Angiography;  Surgeon: [name], MD;  Location: [facility] INVASIVE CV LAB;  Service: Cardiovascular;  Laterality: N/A;  . TEE WITHOUT CARDIOVERSION N/A 06/26/2016   Procedure: TRANSESOPHAGEAL ECHOCARDIOGRAM (TEE);  Surgeon: Pricilla Riffle, MD;  Location: [facility] ENDOSCOPY;  Service: Cardiovascular;  Laterality: N/A;  . TEE WITHOUT CARDIOVERSION N/A 02/07/2017   Procedure: TRANSESOPHAGEAL ECHOCARDIOGRAM (TEE);  Surgeon: Antonieta Iba, MD;  Location: [facility] ORS;  Service: Cardiovascular;  Laterality: N/A;  . WRIST FRACTURE SURGERY Right ~ 2000   "got 8 screws in there"    Prior to Admission medications   Medication Sig Start Date End Date Taking? Authorizing Provider  acetaminophen (TYLENOL) 500 MG tablet Take 1,000 mg by mouth every 6 (six) hours as needed for mild pain or headache.    [provider]  albuterol (PROVENTIL) (2.5 MG/3ML) 0.083% nebulizer solution Take 3 mLs (2.5 mg total) by nebulization every 6 (six) hours as needed for wheezing or shortness of breath. 06/26/17   Jene Every, MD  aspirin EC 81 MG EC tablet Take 1 tablet (81 mg total) by mouth daily. 06/27/16   Thomasene Lot, MD  atorvastatin (LIPITOR) 80 MG tablet Take 1 tablet (80 mg total) by mouth daily at 6 PM. 06/26/16   Thomasene Lot, MD  beclomethasone (QVAR) 40 MCG/ACT inhaler Inhale 1 puff into the  lungs 2 (two) times daily. 05/12/16   Alford Highland, MD  carvedilol (COREG) 3.125 MG tablet Take 1 tablet (3.125 mg total) by mouth 2 (two) times daily with a meal. 06/26/16   Thomasene Lot, MD  cinacalcet (SENSIPAR) 30 MG tablet Take 4 tablets (120 mg total) by mouth daily with supper. Patient taking differently: Take 30 mg by mouth daily with supper.  05/12/16   Alford Highland, MD  cinacalcet (SENSIPAR) 90 MG tablet Take 90 mg by mouth daily with supper.    [provider]  clopidogrel (PLAVIX) 75 MG tablet Take 75 mg by mouth daily. 05/19/16   [provider]  ezetimibe (ZETIA) 10 MG tablet Take 1 tablet (10 mg total) by mouth daily. 08/11/16 08/11/17  Antonieta Iba, MD  furosemide (LASIX) 80 MG tablet Take 160 mg by mouth 2 (two) times daily.     [provider]  gabapentin (NEURONTIN) 300 MG capsule Take 300 mg by mouth at bedtime.  02/08/16   [provider]  midodrine (PROAMATINE) 5 MG tablet Take 1 tablet by mouth See admin instructions. Pt to take only for low BP 01/18/17   [provider]  predniSONE (DELTASONE) 50 MG tablet Take 1 tablet (50 mg total) by mouth daily  with breakfast. 06/26/17   Jene Every, MD  sevelamer carbonate (RENVELA) 2.4 g PACK Take 2.4 g by mouth 3 (three) times daily with meals. Patient taking differently: Take 2.4 g by mouth daily. WITH A SNACK 05/12/16   Alford Highland, MD  SPIRIVA HANDIHALER 18 MCG inhalation capsule Place 18 mcg into inhaler and inhale daily.  02/21/16   [provider]  SYMBICORT 160-4.5 MCG/ACT inhaler Inhale 2 puffs into the lungs 2 (two) times daily. 01/16/17   [provider]     Allergies Ramipril and Aspirin  Family History  Problem Relation Age of Onset  . Hypertension Mother   . CAD Mother     Social History Social History   Tobacco Use  . Smoking status: Current Every Day Smoker    Packs/day: 0.50    Years: 40.00    Pack years: 20.00    Types: Cigarettes   . Smokeless tobacco: Former Neurosurgeon    Types: Chew  . Tobacco comment: 06/22/2016 "quit chewing when I was 79 or 57 years old"  Substance Use Topics  . Alcohol use: No  . Drug use: No    Review of Systems  Constitutional: No fever/chills Eyes: No visual changes.  ENT: No sore throat. Cardiovascular: Denies chest pain. Respiratory: As above Gastrointestinal: No abdominal pain.  No nausea, no vomiting.   Genitourinary: Negative for dysuria. Musculoskeletal: Negative for back pain. Skin: Negative for rash. Neurological: Negative for headaches   ____________________________________________   PHYSICAL EXAM:  VITAL SIGNS: ED Triage Vitals [06/26/17 1200]  Enc Vitals Group     BP 93/64     Pulse Rate 86     Resp (!) 22     Temp 98.2 F (36.8 C)     Temp Source Oral     SpO2 92 %     Weight (!) 142.9 kg (315 lb)     Height 1.829 m (6')     Head Circumference      Peak Flow      Pain Score      Pain Loc      Pain Edu?      Excl. in GC?     Constitutional: Alert and oriented. No acute distress. Pleasant and interactive Eyes: Conjunctivae are normal.   Nose: No congestion/rhinnorhea.  Cardiovascular: Normal rate, regular rhythm. Grossly normal heart sounds.  Good peripheral circulation. Respiratory: Normal respiratory effort.  No retractions.  Scattered moderate wheezing Gastrointestinal: Soft and nontender. No distention.  No CVA tenderness. Genitourinary: deferred Musculoskeletal: No lower extremity tenderness nor edema.  Warm and well perfused Neurologic:  Normal speech and language. No gross focal neurologic deficits are appreciated.  Skin:  Skin is warm, dry and intact. No rash noted. Psychiatric: Mood and affect are normal. Speech and behavior are normal.  ____________________________________________   LABS (all labs ordered are listed, but only abnormal results are displayed)  Labs Reviewed  BASIC METABOLIC PANEL - Abnormal; Notable for the following  components:      Result Value   Chloride 96 (*)    BUN 38 (*)    Creatinine, Ser 6.51 (*)    Calcium 8.7 (*)    GFR calc non Af Amer 8 (*)    GFR calc Af Amer 10 (*)    All other components within normal limits  CBC - Abnormal; Notable for the following components:   RBC 4.19 (*)    Hemoglobin 12.3 (*)    HCT 37.7 (*)    RDW 19.2 (*)  All other components within normal limits  TROPONIN I - Abnormal; Notable for the following components:   Troponin I 0.40 (*)    All other components within normal limits   ____________________________________________  EKG  ED ECG REPORT I, Jene Every, the attending physician, personally viewed and interpreted this ECG.  Date: 06/26/2017  Rhythm: normal sinus rhythm QRS Axis: normal Intervals: normal ST/T Wave abnormalities: normal Narrative Interpretation: no evidence of acute ischemia  ____________________________________________  RADIOLOGY  Chest x-ray with mild interstitial edema ____________________________________________   PROCEDURES  Procedure(s) performed: No  Procedures   Critical Care performed:No ____________________________________________   INITIAL IMPRESSION / ASSESSMENT AND PLAN / ED COURSE  Pertinent labs & imaging results that were available during my care of the patient were reviewed by me and considered in my medical decision making (see chart for details).  Patient well-appearing in no acute distress.  Scattered moderate wheezing.  Suspect COPD exacerbation, will check labs, chest x-ray and reevaluate  Patient with elevated troponin, EKG is unremarkable.  Looking back patient has a chronically elevated troponin which is usually quite significant likely related to end-stage renal disease symptoms not consistent with ACS.   Patient had significant improvement with DuoNeb.  Additional DuoNeb given and on exam good airflow  Will Rx prednisone and nebulizer solution, close PCP follow-up.   return to ED  if any worsening D      ____________________________________________   FINAL CLINICAL IMPRESSION(S) / ED DIAGNOSES  Final diagnoses:  COPD exacerbation (HCC)  Shortness of breath        Note:  This document was prepared using Dragon voice recognition software and may include unintentional dictation errors.    Jene Every, MD 06/26/17 [phone]

## 2017-06-26 NOTE — ED Notes (Addendum)
Pt left at 15:33, delay in charting discharge.

## 2017-06-26 NOTE — ED Triage Notes (Signed)
Pt in via POV with complaints of increasing shortness of breath x 2 days.  Pt with audible wheezes, tachypneic upon arrival.  Vitals WDL.

## 2017-06-26 NOTE — ED Notes (Signed)
Pt ambulatory in bedroom and oxygen saturation on RA dropped from 96% to 83%. Pt reports having SOB upon exertion recently. Pt reports he does not have SOB in the mornings but that it worsens throughout the day.

## 2017-06-30 NOTE — Progress Notes (Signed)
Cardiology Office Note  Date:  07/02/2017   ID:  Justin Fitzgerald, DOB Apr 03, 1961, MRN [id number]  PCP:  Dorothey Baseman, MD   Chief Complaint  Patient presents with  . Other    6 month follow up. Meds reviewed by the pt. verbally. Pt. c/o shortness of breath with activity and has a cold.     HPI:  Justin Fitzgerald is a 57 y.o. 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 OM1 CTO. Proximal RCA 60% stenosed, mid to distal RCA 50% stenosed.   who presents for follow-up of his stroke, chronic diastolic and systolic CHF   In follow-up today he reports having worsening shortness of breath He continues to smoke Went to the ER , June 26, 2017 for shortness of breath Hospital records reviewed with the patient in detail Started on prednisone 5 days and albuterol Troponin 0.4  Continued shortness of breath symptoms, coughing, wearing a mask today Feels like his Lasix is not working very well He reports that he asked renal to cut back on his dialysis time of 4-1/4 hours now down to 4 hours He does eat lots of ice chips, sometimes "overdoes it"  Dry weight 141 kg dry weight HD 4 hours, Tu, thur, sat  EKG personally reviewed by myself on todays visit Shows normal sinus rhythm rate 80 bpm unable to exclude old anterior MI, nonspecific T wave abnormality  Other past medical history reviewed  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  Previous abscess essentially resolved,  Moderate to severe AI noted PA peak pressure: 57 mm Hg (S).  He continues to smoke one half pack per day Leg swelling stable, worse on the left  hospitalization at [facility] for stroke February 2018 Hospital records reviewed as below transesophageal echo/TEE  ejection fraction 35% "walled off regiion next to aorta between left and noncoronary cusps of aortic valve that probably represents remote abscess. There does not appear to be a communication with the aorta. "   CT cardiac study Poor venous access, inadequate opacification of aortic root Moderately calcified aortic atherosclerosis 12 x 19 mm cavity behind non coronary cusp/ and [place]/LC fissure that may represent a sinus of Valsalva aneurysm with possible contained rupture vs wall off abscess.  Recommendation was for revisualization with TEE at 3 months  Cardiac catheterization study reviewed Heavily calcified coronary arteries with chronic occlusion of the first OM branch of the circumflex, moderate diffuse RCA stenosis, and mild nonobstructive LAD stenosis Normal right heart hemodynamics and normal LVEDP Known severe LV systolic dysfunction Medical management was recommended  Lab work reviewed previously by myself HBA1C 5.6 Total chol 170,  LDL 98   admitted to [facility] in late December 2017 for acute hypoxic respiratory failure requiring oxygen supplementation in the setting of COPD exacerbation. treated with IV steroids and nebulizers and antibiotics. complicated with peritonitis requiring antibiotic therapy.  mildly elevated troponin  felt to be secondary to demand ischemia  admitted June 21 2016 with stroke after developing left facial droop and slurring of his speech   MRI brain demonstrated right MCA territory infarct felt to be embolic  Lower extremity dopplers were negative for DVT.  Echo showed newly diagnosed systolic heart failure with EF of 20-25%,  diffuse hypokinesis, G1DD, mild aortic stenosis, aortic root mildly dilated, mildly to moderately calcified mitral annulus, left atrium moderately dilated.    30 day event monitor   asymptomatic NSVT while in the hospital, beta blocker was titrated as tolerated.       PMH:   has a past medical history of CHF (congestive heart failure) (HCC), COPD (chronic obstructive pulmonary disease) (HCC), Dialysis patient (HCC), Dyspnea, ESRD (end stage renal disease) on dialysis [facility]), History of gout, Hyperlipidemia, Hypertension, Peripheral vascular disease (HCC), Stroke (HCC) (06/20/2016), Type II diabetes mellitus (HCC), and Umbilical hernia.  PSH:    Past Surgical History:  Procedure Laterality Date  . A/V FISTULAGRAM Left 12/05/2016   Procedure: A/V Fistulagram;  Surgeon: Renford Dills, MD;  Location: [facility] INVASIVE CV LAB;  Service: Cardiovascular;  Laterality: Left;  . AV FISTULA PLACEMENT Left 07/28/2016   Procedure: ARTERIOVENOUS (AV) FISTULA CREATION;  Surgeon: Renford Dills, MD;  Location: [facility] ORS;  Service: Vascular;  Laterality: Left;  . CARDIAC CATHETERIZATION    . DIALYSIS/PERMA CATHETER INSERTION N/A 06/28/2016   Procedure: Dialysis/Perma Catheter Insertion;  Surgeon: Renford Dills, MD;  Location: [facility] INVASIVE CV LAB;  Service: Cardiovascular;  Laterality: N/A;  . DIALYSIS/PERMA CATHETER INSERTION N/A 08/25/2016   Procedure: Dialysis/Perma Catheter Insertion;  Surgeon: Annice Needy, MD;  Location: [facility] INVASIVE CV LAB;  Service: Cardiovascular;  Laterality: N/A;  . DIALYSIS/PERMA CATHETER INSERTION N/A 09/08/2016   Procedure: Dialysis/Perma Catheter Insertion;  Surgeon: Renford Dills, MD;  Location: [facility] INVASIVE CV LAB;  Service: Cardiovascular;  Laterality: N/A;  . DIALYSIS/PERMA CATHETER REMOVAL N/A 12/25/2016   Procedure: DIALYSIS/PERMA CATHETER REMOVAL;  Surgeon: Annice Needy, MD;  Location: [facility] INVASIVE CV LAB;  Service: Cardiovascular;  Laterality: N/A;  .  EXTERIORIZATION OF A CONTINUOUS AMBULATORY PERITONEAL DIALYSIS CATHETER    . FRACTURE SURGERY    . IR GENERIC HISTORICAL  08/07/2016   IR VENIPUNCTURE 70YRS/OLDER BY MD 08/07/2016 Jolaine Click, MD [facility]-INTERV RAD  . IR GENERIC HISTORICAL  08/07/2016   IR US GUIDE VASC ACCESS RIGHT 08/07/2016 Jolaine Click, MD [facility]-INTER[facility]  . PERIPHERAL VASCULAR CATHETERIZATION N/A 03/08/2016   Procedure: Dialysis/Perma Catheter Insertion;  Surgeon: Renford Dills, MD;  Location: [facility] INVASIVE CV LAB;  Service: Cardiovascular;  Laterality: N/A;  . PERIPHERAL VASCULAR CATHETERIZATION N/A 04/20/2016   Procedure: Dialysis/Perma Catheter Removal;  Surgeon: Annice Needy, MD;  Location: [facility] INVASIVE CV LAB;  Service: Cardiovascular;  Laterality: N/A;  . PERIPHERAL VASCULAR CATHETERIZATION N/A 05/25/2016   Procedure: Dialysis/Perma Catheter Insertion;  Surgeon: Annice Needy, MD;  Location: [facility] INVASIVE CV LAB;  Service: Cardiovascular;  Laterality: N/A;  . REMOVAL OF A DIALYSIS CATHETER N/A 09/29/2016   Procedure: REMOVAL OF A DIALYSIS CATHETER ( PERITONEAL DIALYSIS CATH );  Surgeon: Renford Dills, MD;  Location: [facility] ORS;  Service: Vascular;  Laterality: N/A;  . RIGHT/LEFT HEART CATH AND CORONARY ANGIOGRAPHY N/A 06/23/2016   Procedure: Right/Left Heart Cath and Coronary Angiography;  Surgeon: [name], MD;  Location: [facility] INVASIVE CV LAB;  Service: Cardiovascular;  Laterality: N/A;  . TEE WITHOUT CARDIOVERSION N/A 06/26/2016   Procedure: TRANSESOPHAGEAL ECHOCARDIOGRAM (TEE);  Surgeon: Pricilla Riffle, MD;  Location: [facility] ENDOSCOPY;  Service: Cardiovascular;  Laterality: N/A;  . TEE WITHOUT CARDIOVERSION N/A 02/07/2017   Procedure: TRANSESOPHAGEAL ECHOCARDIOGRAM (TEE);  Surgeon: Antonieta Iba, MD;  Location: [facility] ORS;  Service: Cardiovascular;  Laterality: N/A;  . WRIST FRACTURE SURGERY Right ~ 2000   "got 8 screws in there"    Current Outpatient Medications  Medication Sig Dispense Refill  . acetaminophen (TYLENOL) 500  MG tablet Take 1,000 mg by mouth every 6 (six) hours as needed for mild pain or headache.    . albuterol (PROVENTIL) (2.5 MG/3ML) 0.083% nebulizer solution Take 3 mLs (2.5 mg total) by nebulization every 6 (six) hours as needed for wheezing or shortness of breath. 75 mL 12  . aspirin EC 81 MG EC tablet Take 1 tablet (81 mg total) by mouth daily. 90 tablet 3  . atorvastatin (LIPITOR) 80 MG tablet Take 1 tablet (80 mg total) by mouth daily at 6 PM. 60 tablet 1  . beclomethasone (QVAR) 40 MCG/ACT inhaler Inhale 1 puff into the lungs 2 (two) times daily. 1 Inhaler 0  . carvedilol (COREG) 3.125 MG tablet Take 1 tablet (3.125 mg total) by mouth 2 (two) times daily with a meal. 60 tablet 1  . cinacalcet (SENSIPAR) 30 MG tablet Take 4 tablets (120 mg total) by mouth daily with supper. (Patient taking differently: Take 30 mg by mouth daily with supper. ) 120 tablet 0  . clopidogrel (PLAVIX) 75 MG tablet Take 75 mg by mouth daily.    [name] ezetimibe (ZETIA) 10 MG tablet Take 1 tablet (10 mg total) by mouth daily. 90 tablet 3  . furosemide (LASIX) 80 MG tablet Take 160 mg by mouth 2 (two) times daily.     [name] gabapentin (NEURONTIN) 300 MG capsule Take 300 mg by mouth at bedtime.     . midodrine (PROAMATINE) 5 MG tablet Take 1 tablet by mouth See admin instructions. Pt to take only for low BP  6  . sevelamer carbonate (RENVELA) 2.4 g PACK Take 2.4 g by mouth 3 (three) times daily with meals. (Patient taking differently: Take 2.4 g by mouth daily. WITH A SNACK) 90 each 0  . SPIRIVA HANDIHALER 18 MCG inhalation capsule Place 18 mcg into inhaler and inhale daily.     . SYMBICORT 160-4.5 MCG/ACT inhaler Inhale 2 puffs into the lungs 2 (two) times daily.  5   No current facility-administered medications for this visit.      Allergies:   Ramipril and Aspirin   Social History:  The patient  reports that he has been smoking cigarettes.  He has a 20.00 pack-year smoking history. He has quit using smokeless tobacco. His  smokeless tobacco use included chew. He reports that he does not drink alcohol or use drugs.   Family History:   family history includes CAD in his mother; Hypertension in his mother.    Review of Systems: Review of Systems  Constitutional: Negative.   Respiratory: Positive for shortness of breath.   Cardiovascular: Positive for leg swelling.  Gastrointestinal: Negative.   Musculoskeletal: Negative.   Neurological: Negative.   Psychiatric/Behavioral: Negative.   All other systems reviewed and are negative.    PHYSICAL EXAM: VS:  BP 100/60 (BP Location: Right Arm, Patient Position: Sitting, Cuff Size: Large)   Pulse 80   Ht 6' (1.829 m)   Wt (!) 319 lb 8 oz (144.9 kg)   BMI 43.33 kg/m  , BMI Body mass index is 43.33 kg/m. Constitutional:  oriented to person, place, and time. No  distress.  HENT:  Head: Normocephalic and atraumatic.  Eyes:  no discharge. No scleral icterus.  Neck: Normal range of motion. Neck supple. No JVD present.  Cardiovascular: Normal rate, regular rhythm, normal heart sounds and intact distal pulses. Exam reveals no gallop and no friction rub. No edema No murmur heard. Pulmonary/Chest: Effort normal and breath sounds normal. No stridor. No respiratory distress.  + wheezes.  no rales.  no tenderness.  Abdominal: Soft.  no distension.  no tenderness.  Musculoskeletal: Normal range of motion.  no  tenderness or deformity.  Neurological:  normal muscle tone. Coordination normal. No atrophy Skin: Skin is warm and dry. No rash noted. not diaphoretic.  Psychiatric:  normal mood and affect. behavior is normal. Thought content normal.    Recent Labs: 06/26/2017: BUN 38; Creatinine, Ser 6.51; Hemoglobin 12.3; Platelets 192; Potassium 4.4; Sodium 135    Lipid Panel Lab Results  Component Value Date   CHOL 170 06/22/2016   HDL 35 (L) 06/22/2016   LDLCALC 98 06/22/2016   TRIG 185 (H) 06/22/2016      Wt Readings from Last 3 Encounters:  07/02/17 (!) 319 lb  8 oz (144.9 kg)  06/26/17 (!) 315 lb (142.9 kg)  02/07/17 (!) 315 lb (142.9 kg)       ASSESSMENT AND PLAN:  Essential hypertension Blood pressure is well controlled on today's visit. No changes made to the medications. Denies any orthostasis symptoms  Cerebrovascular accident (CVA) due to embolism of right middle cerebral artery (HCC) 30 day monitor no significant arrhythmia.  on aspirin and Plavix Unable to exclude mural thrombus given his cardiomyopathy  unable to use warfarin as he is on dialysis, hemodialysis No changes to his regimen as detailed above  Chronic combined systolic and diastolic congestive heart failure (HCC) Dry weight 141 kg with hemodialysis  Long discussion with him, recommend he talk with Dr. Thedore Mins Reports having no significant urination with Lasix Worsening shortness of breath Perhaps we could change to torsemide 80 twice daily on nondialysis days Echocardiogram pending to evaluate ejection fraction and aortic valve  Peripheral vascular disease (HCC) Seen on CT scan, stressed importance of aggressive cholesterol control, smoking cessation.  He continues to smoke, does not want Chantix  Mixed hyperlipidemia Continue Zetia with statin Goal LDL <70  Type 2 diabetes mellitus with stage 5 chronic kidney disease not on chronic dialysis, without long-term current use of insulin (HCC) We have encouraged continued exercise, careful diet management in an effort to lose weight. Hemoglobin A1c relatively well-controlled Stable  Acute respiratory failure with hypoxia and hypercapnia (HCC) Respiratory status is worse, worsening shortness of breath  May need longer prednisone taper, home nebulizer Recommend we have him establish with pulmonary Needs to quit smoking  Smoker   does not want any assistance with chantix.  Counseled for 5 minutes or more on smoking cessation techniques  Aortic abscess  TEE confirmed dramatic improvement in abscess area.  Size  dramatically smaller  repeat echocardiogram has been ordered to evaluate aortic valve, previously estimated to have moderate to severe regurgitation  Now with worsening shortness of breath  Disposition:   F/U  6 months   Total encounter time more than 45 minutes  Greater than 50% was spent in counseling and coordination of care with the patient    Orders Placed This Encounter  Procedures  . EKG 12-Lead     Signed, Dossie Arbour, M.D., Ph.D. 07/02/2017  [facility] [place], [place] [phone]

## 2017-07-02 ENCOUNTER — Encounter: Payer: Self-pay | Admitting: Cardiovascular Disease

## 2017-07-02 ENCOUNTER — Ambulatory Visit (INDEPENDENT_AMBULATORY_CARE_PROVIDER_SITE_OTHER): Payer: Medicare Other | Admitting: Cardiovascular Disease

## 2017-07-02 VITALS — BP 100/60 | HR 80 | Ht 72.0 in | Wt 319.5 lb

## 2017-07-02 DIAGNOSIS — I5189 Other ill-defined heart diseases: Secondary | ICD-10-CM | POA: Diagnosis not present

## 2017-07-02 DIAGNOSIS — I351 Nonrheumatic aortic (valve) insufficiency: Secondary | ICD-10-CM

## 2017-07-02 DIAGNOSIS — J449 Chronic obstructive pulmonary disease, unspecified: Secondary | ICD-10-CM

## 2017-07-02 DIAGNOSIS — I5042 Chronic combined systolic (congestive) and diastolic (congestive) heart failure: Secondary | ICD-10-CM | POA: Diagnosis not present

## 2017-07-02 DIAGNOSIS — J9601 Acute respiratory failure with hypoxia: Secondary | ICD-10-CM

## 2017-07-02 DIAGNOSIS — I10 Essential (primary) hypertension: Secondary | ICD-10-CM | POA: Diagnosis not present

## 2017-07-02 DIAGNOSIS — N185 Chronic kidney disease, stage 5: Secondary | ICD-10-CM

## 2017-07-02 DIAGNOSIS — E782 Mixed hyperlipidemia: Secondary | ICD-10-CM

## 2017-07-02 DIAGNOSIS — I739 Peripheral vascular disease, unspecified: Secondary | ICD-10-CM | POA: Diagnosis not present

## 2017-07-02 DIAGNOSIS — N186 End stage renal disease: Secondary | ICD-10-CM

## 2017-07-02 DIAGNOSIS — E1122 Type 2 diabetes mellitus with diabetic chronic kidney disease: Secondary | ICD-10-CM

## 2017-07-02 DIAGNOSIS — I2510 Atherosclerotic heart disease of native coronary artery without angina pectoris: Secondary | ICD-10-CM | POA: Diagnosis not present

## 2017-07-02 DIAGNOSIS — J9602 Acute respiratory failure with hypercapnia: Secondary | ICD-10-CM

## 2017-07-02 NOTE — Patient Instructions (Addendum)
We will place a referral to pulmonary to COPD exacerbation   Medication Instructions:   Ask Dr. Thedore Mins about changing furosemide to TORSEMIDE 80 mg twice a day  Try candy and gum for dry mouth  Labwork:  No new labs needed  Testing/Procedures:  We will schedule an echocardioghram for severe aortic valve regurgitation, pulm HTN and SOB   Follow-Up: It was a pleasure seeing you in the office today. Please call us if you have new issues that need to be addressed before your next appt.  [phone]  Your physician wants you to follow-up in: 6 months.  You will receive a reminder letter in the mail two months in advance. If you don't receive a letter, please call our office to schedule the follow-up appointment.  If you need a refill on your cardiac medications before your next appointment, please call your pharmacy.

## 2017-07-13 ENCOUNTER — Ambulatory Visit (INDEPENDENT_AMBULATORY_CARE_PROVIDER_SITE_OTHER): Payer: Medicare Other

## 2017-07-13 ENCOUNTER — Other Ambulatory Visit: Payer: Self-pay

## 2017-07-13 DIAGNOSIS — I351 Nonrheumatic aortic (valve) insufficiency: Secondary | ICD-10-CM

## 2017-07-30 ENCOUNTER — Ambulatory Visit (INDEPENDENT_AMBULATORY_CARE_PROVIDER_SITE_OTHER): Payer: Medicare Other | Admitting: Cardiovascular Disease

## 2017-07-30 ENCOUNTER — Encounter: Payer: Self-pay | Admitting: Cardiovascular Disease

## 2017-07-30 VITALS — BP 80/52 | HR 72 | Ht 72.0 in | Wt 321.0 lb

## 2017-07-30 DIAGNOSIS — I350 Nonrheumatic aortic (valve) stenosis: Secondary | ICD-10-CM | POA: Diagnosis not present

## 2017-07-30 DIAGNOSIS — I2510 Atherosclerotic heart disease of native coronary artery without angina pectoris: Secondary | ICD-10-CM

## 2017-07-30 LAB — CBC WITH DIFFERENTIAL/PLATELET
BASOS: 0 %
Basophils Absolute: 0 10*3/uL (ref 0.0–0.2)
EOS (ABSOLUTE): 0.3 10*3/uL (ref 0.0–0.4)
Eos: 4 %
HEMOGLOBIN: 11.8 g/dL — AB (ref 13.0–17.7)
Hematocrit: 36.5 % — ABNORMAL LOW (ref 37.5–51.0)
IMMATURE GRANS (ABS): 0 10*3/uL (ref 0.0–0.1)
IMMATURE GRANULOCYTES: 0 %
LYMPHS: 15 %
Lymphocytes Absolute: 1 10*3/uL (ref 0.7–3.1)
MCH: 28.9 pg (ref 26.6–33.0)
MCHC: 32.3 g/dL (ref 31.5–35.7)
MCV: 89 fL (ref 79–97)
MONOCYTES: 11 %
Monocytes Absolute: 0.7 10*3/uL (ref 0.1–0.9)
NEUTROS ABS: 4.5 10*3/uL (ref 1.4–7.0)
NEUTROS PCT: 70 %
PLATELETS: 220 10*3/uL (ref 150–379)
RBC: 4.09 x10E6/uL — ABNORMAL LOW (ref 4.14–5.80)
RDW: 17 % — ABNORMAL HIGH (ref 12.3–15.4)
WBC: 6.6 10*3/uL (ref 3.4–10.8)

## 2017-07-30 LAB — BASIC METABOLIC PANEL
BUN/Creatinine Ratio: 5 — ABNORMAL LOW (ref 9–20)
BUN: 44 mg/dL — AB (ref 6–24)
CALCIUM: 10.4 mg/dL — AB (ref 8.7–10.2)
CO2: 23 mmol/L (ref 20–29)
CREATININE: 9.16 mg/dL — AB (ref 0.76–1.27)
Chloride: 96 mmol/L (ref 96–106)
GFR, EST AFRICAN AMERICAN: 7 mL/min/{1.73_m2} — AB (ref >59)
GFR, EST NON AFRICAN AMERICAN: 6 mL/min/{1.73_m2} — AB (ref >59)
Glucose: 86 mg/dL (ref 65–99)
POTASSIUM: 5.5 mmol/L — AB (ref 3.5–5.2)
Sodium: 137 mmol/L (ref 134–144)

## 2017-07-30 LAB — PROTIME-INR
INR: 1 (ref 0.8–1.2)
Prothrombin Time: 10.7 s (ref 9.1–12.0)

## 2017-07-30 NOTE — Progress Notes (Signed)
Cardiology Office Note Date:  07/30/2017   ID:  Justin Fitzgerald, DOB May 27, 1960, MRN [id number]  PCP:  Dorothey Baseman, MD  Cardiologist:  Julien Nordmann, MD  Chief Complaint  Patient presents with  . Shortness of Breath   History of Present Illness: Justin Fitzgerald is a 57 y.o. male who presents for evaluation of severe aortic stenosis, referred by Dr Mariah Milling.  He's here with his wife today.  He is limited by shortness of breath with minimal activity.  The patient is in a wheelchair today.  Has been a lifelong smoker, trying to quit, down to 2 cigarettes/day.   The patient had a stroke about one year ago, presenting with slurred speech, left hand weakness, and facial droop. He reports no residual deficit.   The patient reports progressive shortness of breath with exertion. He now is limited by dyspnea with short distance walking. He notes some improvement with use of inhalers. He reports occasional chest discomfort described as a pressure in his upper chest, not associated with dialysis or with any particular activity. He denies syncope, but has occasional lightheadedness especially if he 'walks too much.'   The patient has ESRD and has been on dialysis for over 10 years. He did peritoneal dialysis for approximately 9 years and has been on hemodialysis now for 2 years.    Past Medical History:  Diagnosis Date  . CHF (congestive heart failure) (HCC)   . COPD (chronic obstructive pulmonary disease) (HCC)   . Dialysis patient (HCC)    Tues. Thurs. Sat.  [name] Dyspnea    with exertion  . ESRD (end stage renal disease) on dialysis (HCC)    "TTS; [facility] [place]" (06/22/2016)  . History of gout   . Hyperlipidemia   . Hypertension   . Peripheral vascular disease (HCC)   . Stroke (HCC) 06/20/2016   denies residual on 06/22/2016  . Type II diabetes mellitus (HCC)    diet control  . Umbilical hernia     Past Surgical History:  Procedure Laterality Date  . A/V  FISTULAGRAM Left 12/05/2016   Procedure: A/V Fistulagram;  Surgeon: Renford Dills, MD;  Location: [facility] INVASIVE CV LAB;  Service: Cardiovascular;  Laterality: Left;  . AV FISTULA PLACEMENT Left 07/28/2016   Procedure: ARTERIOVENOUS (AV) FISTULA CREATION;  Surgeon: Renford Dills, MD;  Location: [facility] ORS;  Service: Vascular;  Laterality: Left;  . CARDIAC CATHETERIZATION    . DIALYSIS/PERMA CATHETER INSERTION N/A 06/28/2016   Procedure: Dialysis/Perma Catheter Insertion;  Surgeon: Renford Dills, MD;  Location: [facility] INVASIVE CV LAB;  Service: Cardiovascular;  Laterality: N/A;  . DIALYSIS/PERMA CATHETER INSERTION N/A 08/25/2016   Procedure: Dialysis/Perma Catheter Insertion;  Surgeon: Annice Needy, MD;  Location: [facility] INVASIVE CV LAB;  Service: Cardiovascular;  Laterality: N/A;  . DIALYSIS/PERMA CATHETER INSERTION N/A 09/08/2016   Procedure: Dialysis/Perma Catheter Insertion;  Surgeon: Renford Dills, MD;  Location: [facility] INVASIVE CV LAB;  Service: Cardiovascular;  Laterality: N/A;  . DIALYSIS/PERMA CATHETER REMOVAL N/A 12/25/2016   Procedure: DIALYSIS/PERMA CATHETER REMOVAL;  Surgeon: Annice Needy, MD;  Location: [facility] INVASIVE CV LAB;  Service: Cardiovascular;  Laterality: N/A;  . EXTERIORIZATION OF A CONTINUOUS AMBULATORY PERITONEAL DIALYSIS CATHETER    . FRACTURE SURGERY    . IR GENERIC HISTORICAL  08/07/2016   IR VENIPUNCTURE 38YRS/OLDER BY MD 08/07/2016 Jolaine Click, MD [facility]INTER[facility]  . IR GENERIC HISTORICAL  08/07/2016   IR US GUIDE VASC ACCESS RIGHT 08/07/2016 Jolaine Click, MD [facility]  .  PERIPHERAL VASCULAR CATHETERIZATION N/A 03/08/2016   Procedure: Dialysis/Perma Catheter Insertion;  Surgeon: Renford Dills, MD;  Location: [facility] INVASIVE CV LAB;  Service: Cardiovascular;  Laterality: N/A;  . PERIPHERAL VASCULAR CATHETERIZATION N/A 04/20/2016   Procedure: Dialysis/Perma Catheter Removal;  Surgeon: Annice Needy, MD;  Location: [facility] INVASIVE CV LAB;  Service: Cardiovascular;   Laterality: N/A;  . PERIPHERAL VASCULAR CATHETERIZATION N/A 05/25/2016   Procedure: Dialysis/Perma Catheter Insertion;  Surgeon: Annice Needy, MD;  Location: [facility] INVASIVE CV LAB;  Service: Cardiovascular;  Laterality: N/A;  . REMOVAL OF A DIALYSIS CATHETER N/A 09/29/2016   Procedure: REMOVAL OF A DIALYSIS CATHETER ( PERITONEAL DIALYSIS CATH );  Surgeon: Renford Dills, MD;  Location: [facility] ORS;  Service: Vascular;  Laterality: N/A;  . RIGHT/LEFT HEART CATH AND CORONARY ANGIOGRAPHY N/A 06/23/2016   Procedure: Right/Left Heart Cath and Coronary Angiography;  Surgeon: [name], MD;  Location: [facility] INVASIVE CV LAB;  Service: Cardiovascular;  Laterality: N/A;  . TEE WITHOUT CARDIOVERSION N/A 06/26/2016   Procedure: TRANSESOPHAGEAL ECHOCARDIOGRAM (TEE);  Surgeon: Pricilla Riffle, MD;  Location: [facility] ENDOSCOPY;  Service: Cardiovascular;  Laterality: N/A;  . TEE WITHOUT CARDIOVERSION N/A 02/07/2017   Procedure: TRANSESOPHAGEAL ECHOCARDIOGRAM (TEE);  Surgeon: Antonieta Iba, MD;  Location: [facility] ORS;  Service: Cardiovascular;  Laterality: N/A;  . WRIST FRACTURE SURGERY Right ~ 2000   "got 8 screws in there"    Current Outpatient Medications  Medication Sig Dispense Refill  . acetaminophen (TYLENOL) 500 MG tablet Take 1,000 mg by mouth every 6 (six) hours as needed for mild pain or headache.    . albuterol (PROVENTIL) (2.5 MG/3ML) 0.083% nebulizer solution Take 3 mLs (2.5 mg total) by nebulization every 6 (six) hours as needed for wheezing or shortness of breath. 75 mL 12  . aspirin EC 81 MG EC tablet Take 1 tablet (81 mg total) by mouth daily. 90 tablet 3  . atorvastatin (LIPITOR) 80 MG tablet Take 1 tablet (80 mg total) by mouth daily at 6 PM. 60 tablet 1  . beclomethasone (QVAR) 40 MCG/ACT inhaler Inhale 1 puff into the lungs 2 (two) times daily. 1 Inhaler 0  . carvedilol (COREG) 3.125 MG tablet Take 1 tablet (3.125 mg total) by mouth 2 (two) times daily with a meal. 60 tablet 1  . cinacalcet  (SENSIPAR) 30 MG tablet Take 60 mg by mouth 3 (three) times a week. (on Tuesdays, Thursdays, and Saturdays)    . clopidogrel (PLAVIX) 75 MG tablet Take 75 mg by mouth daily.    [name] ezetimibe (ZETIA) 10 MG tablet Take 1 tablet (10 mg total) by mouth daily. 90 tablet 3  . gabapentin (NEURONTIN) 300 MG capsule Take 300 mg by mouth at bedtime.     . lidocaine-prilocaine (EMLA) cream Use as directed for dialysis.  99  . midodrine (PROAMATINE) 5 MG tablet Take 1 tablet by mouth See admin instructions. Pt to take only for low BP  6  . sevelamer carbonate (RENVELA) 2.4 g PACK Take 2.4 g by mouth 3 (three) times daily with meals. (Patient taking differently: Take 2.4 g by mouth daily. WITH A SNACK) 90 each 0  . SPIRIVA HANDIHALER 18 MCG inhalation capsule Place 18 mcg into inhaler and inhale daily.     . SYMBICORT 160-4.5 MCG/ACT inhaler Inhale 2 puffs into the lungs 2 (two) times daily.  5  . torsemide (DEMADEX) 100 MG tablet Take 100 mg by mouth 2 (two) times daily.     No current facility-administered  medications for this visit.     Allergies:   Ramipril and Aspirin   Social History:  The patient  reports that he has been smoking cigarettes.  He has a 20.00 pack-year smoking history. He has quit using smokeless tobacco. His smokeless tobacco use included chew. He reports that he does not drink alcohol or use drugs.   Family History:  The patient's  family history includes CAD in his mother; Hypertension in his mother.    ROS:  Please see the history of present illness.  Otherwise, review of systems is positive for leg pain, fatigue. All other systems are reviewed and negative.    PHYSICAL EXAM: VS:  BP (!) 80/52   Pulse 72   Ht 6' (1.829 m)   Wt (!) 321 lb (145.6 kg)   BMI 43.54 kg/m  , BMI Body mass index is 43.54 kg/m. GEN: Pleasant obese male, in no acute distress  HEENT: normal  Neck: no JVD, no masses. Bilateral carotid bruits Cardiac: RRR with 3/6 harsh systolic murmur at the  LLSB/apex, crescendo/decrescendo            Respiratory:  clear to auscultation bilaterally, normal work of breathing GI: soft, nontender, nondistended, + BS MS: no deformity or atrophy  Ext: trace bilateral pretibial edema Skin: warm and dry, no rash Neuro:  Strength and sensation are intact Psych: euthymic mood, full affect  EKG:  EKG is not ordered today.  Recent Labs: 06/26/2017: BUN 38; Creatinine, Ser 6.51; Hemoglobin 12.3; Platelets 192; Potassium 4.4; Sodium 135   Lipid Panel     Component Value Date/Time   CHOL 170 06/22/2016 0235   TRIG 185 (H) 06/22/2016 0235   HDL 35 (L) 06/22/2016 0235   CHOLHDL 4.9 06/22/2016 0235   VLDL 37 06/22/2016 0235   LDLCALC 98 06/22/2016 0235      Wt Readings from Last 3 Encounters:  07/30/17 (!) 321 lb (145.6 kg)  07/02/17 (!) 319 lb 8 oz (144.9 kg)  06/26/17 (!) 315 lb (142.9 kg)     Cardiac Studies Reviewed: 06/23/2016 Cath: Conclusion   1. Heavily calcified coronary arteries with chronic occlusion of the first OM branch of the circumflex, moderate diffuse RCA stenosis, and mild nonobstructive LAD stenosis 2. Normal right heart hemodynamics and normal LVEDP 3. Known severe LV systolic dysfunction  Recommend: Medical therapy for CAD and cardiomyopathy. 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  Indications   Chronic systolic heart failure (HCC) [I50.22 (ICD-10-CM)]  Procedural Details/Technique   Technical Details INDICATION: CHF, Severe LV dysfunction with LVEF < 30%  PROCEDURAL DETAILS: The right groin was prepped, draped, and anesthetized with 1% lidocaine. Using the modified Seldinger technique a 5 French sheath was placed in the right femoral artery and a 7 French sheath was placed in the right femoral vein. A Swan-Ganz catheter was used for the right heart catheterization. Standard protocol was followed for recording of right heart pressures and sampling of oxygen saturations.  Fick cardiac output was calculated. Standard Judkins catheters were used for selective coronary angiography. LV pressure is recorded with the JR4 catheter. There were no immediate procedural complications. The patient was transferred to the post catheterization recovery area for further monitoring.   Estimated blood loss <50 mL.  During this procedure the patient was administered the following to achieve and maintain moderate conscious sedation: Versed 2 mg, Fentanyl 100 mcg, while the patient's heart rate, blood pressure, and oxygen saturation were continuously monitored. The period  of conscious sedation was 20 minutes, of which I was present face-to-face 100% of this time.  Coronary Findings   Diagnostic  Dominance: Right  Left Anterior Descending  Mid LAD lesion 40% stenosed  The lesion is calcified. diffuse calcific LAD stenosis, nonobstructive  Left Circumflex  First Obtuse Marginal Branch  1st Mrg lesion 100% stenosed  The lesion is calcified. chronic OM occlusion, heavy calcification  Right Coronary Artery  Prox RCA lesion 60% stenosed  The lesion is calcified.  Mid RCA to Dist RCA lesion 50% stenosed  The lesion is calcified.  Intervention   No interventions have been documented.  Right Heart   Right Heart Pressures LV EDP is normal.  Coronary Diagrams   Diagnostic Diagram       Implants     No implant documentation for this case.  MERGE Images   Show images for Cardiac catheterization   Link to Procedure Log   Procedure Log    Hemo Data    Most Recent Value  Fick Cardiac Output 9.18 L/min  Fick Cardiac Output Index 3.6 (L/min)/BSA  Aortic Mean Gradient 26.7 mmHg  Aortic Peak Gradient 30 mmHg  Aortic Valve Area 1.71  Aortic Value Area Index 0.67 cm2/BSA  RA A Wave 9 mmHg  RA V Wave 10 mmHg  RA Mean 6 mmHg  RV Systolic Pressure 36 mmHg  RV Diastolic Pressure 4 mmHg  RV EDP 9 mmHg  PA Systolic Pressure 32 mmHg  PA Diastolic Pressure 7 mmHg  PA Mean  20 mmHg  PW A Wave 12 mmHg  PW V Wave 11 mmHg  PW Mean 9 mmHg  AO Systolic Pressure 104 mmHg  AO Diastolic Pressure 68 mmHg  AO Mean 81 mmHg  LV Systolic Pressure 125 mmHg  LV Diastolic Pressure 8 mmHg  LV EDP 14 mmHg  Arterial Occlusion Pressure Extended Systolic Pressure 94 mmHg  Arterial Occlusion Pressure Extended Diastolic Pressure 65 mmHg  Arterial Occlusion Pressure Extended Mean Pressure 79 mmHg  Left Ventricular Apex Extended Systolic Pressure 124 mmHg  Left Ventricular Apex Extended Diastolic Pressure 7 mmHg  Left Ventricular Apex Extended EDP Pressure 12 mmHg  QP/QS 1  TPVR Index 5.28 HRUI  TSVR Index 22.5 HRUI  PVR SVR Ratio 0.13  TPVR/TSVR Ratio 0.23   TEE 02/07/2017: Study Conclusions  - Left ventricle: Systolic function was mildly reduced. The   estimated ejection fraction was in the range of 45% to 50%. Wall   motion was normal; there were no regional wall motion   abnormalities. - Aortic valve: Cusp separation was moderately reduced. There was   moderate to severe regurgitation. Previously noted &quot;pocket&quot;   between the non-coronary and left coronary cusp has essentially   resolved. Minimal traces remain. - Mitral valve: There was mild regurgitation. - Left atrium: The atrium was moderately dilated. No evidence of   thrombus in the atrial cavity or appendage. - Right atrium: The atrium was mildly dilated. No evidence of   thrombus in the atrial cavity or appendage. - Atrial septum: No defect or patent foramen ovale was identified. - Tricuspid valve: There was moderate regurgitation directed   eccentrically and toward the septum. - Pulmonary arteries: PA pressure were moderately elevated, PA peak   pressure: 57 mm Hg (S).  Impressions:  - LV systolic function appears to have improved compared to   previous study in 06/2016.  Echo 07/13/2017: Left ventricle:  The cavity size was mildly dilated. There was moderate concentric hypertrophy.  Systolic function was  severely reduced. The estimated ejection fraction was in the range of 20% to 25%. Wall motion was normal; there were no regional wall motion abnormalities. Doppler parameters are consistent with a reversible restrictive pattern, indicative of decreased left ventricular diastolic compliance and/or increased left atrial pressure (grade 3 diastolic dysfunction).  ------------------------------------------------------------------- Aortic valve:  Poorly visualized. severely calcified leaflets. Mobility was not restricted.  Doppler:  Aortic valve stenosis appears severe. Measurements are likely under-estimated secondary to severely reduced LV function. There was mild regurgitation. VTI ratio of LVOT to aortic valve: 0.21. Valve area (VTI): 0.67 cm^2. Indexed valve area (VTI): 0.24 cm^2/m^2. Mean velocity ratio of LVOT to aortic valve: 0.15. Valve area (Vmean): 0.48 cm^2. Indexed valve area (Vmean): 0.17 cm^2/m^2.    Mean gradient (S): 31 mm Hg. Peak gradient (S): 59 mm Hg.  ------------------------------------------------------------------- Aorta:  Aortic root: The aortic root was normal in size.  ------------------------------------------------------------------- Mitral valve:   Calcified annulus. Mobility was not restricted. Doppler:  Transvalvular velocity was within the normal range. There was no evidence for stenosis. There was no regurgitation.    Valve area by pressure half-time: 6.29 cm^2. Indexed valve area by pressure half-time: 2.26 cm^2/m^2.    Peak gradient (D): 4 mm Hg.   ------------------------------------------------------------------- Left atrium:  The atrium was moderately dilated.  ------------------------------------------------------------------- Right ventricle:  The cavity size was mildly dilated. Wall thickness was normal. Systolic function was normal.  ------------------------------------------------------------------- Pulmonic  valve:    Structurally normal valve.   Cusp separation was normal.  Doppler:  Transvalvular velocity was within the normal range. There was no evidence for stenosis. There was no regurgitation.  ------------------------------------------------------------------- Tricuspid valve:   Structurally normal valve.    Doppler: Transvalvular velocity was within the normal range. There was mild-moderate regurgitation.  ------------------------------------------------------------------- Pulmonary artery:   The main pulmonary artery was normal-sized. Systolic pressure was moderate to severely elevated.  ------------------------------------------------------------------- Right atrium:  The atrium was normal in size.  ------------------------------------------------------------------- Pericardium:  There was no pericardial effusion.  ------------------------------------------------------------------- Systemic veins: Inferior vena cava: The vessel was normal in size.  ------------------------------------------------------------------- Measurements   Left ventricle                           Value          Reference  LV ID, ED, PLAX chordal                  47    mm       43 - 52  LV ID, ES, PLAX chordal          (H)     42    mm       23 - 38  LV fx shortening, PLAX chordal   (L)     11    %        >=29  LV PW thickness, ED                      14    mm       ----------  IVS/LV PW ratio, ED                      1.14           <=1.3  Stroke volume, 2D                        65  ml       ----------  Stroke volume/bsa, 2D                    23    ml/m^2   ----------  LV e&', lateral                           6.81  cm/s     ----------  LV E/e&', lateral                         14.98          ----------  LV e&', medial                            3.57  cm/s     ----------  LV E/e&', medial                          28.57          ----------  LV e&', average                           5.19   cm/s     ----------  LV E/e&', average                         19.65          ----------    Ventricular septum                       Value          Reference  IVS thickness, ED                        16    mm       ----------    LVOT                                     Value          Reference  LVOT ID, S                               20    mm       ----------  LVOT area                                3.14  cm^2     ----------  LVOT ID                                  20    mm       ----------  LVOT mean velocity, S                    58.7  cm/s     ----------  LVOT VTI, S  20.6  cm       ----------  Stroke volume (SV), LVOT DP              64.7  ml       ----------  Stroke index (SV/bsa), LVOT DP           23.3  ml/m^2   ----------    Aortic valve                             Value          Reference  Aortic valve mean velocity, S            384   cm/s     ----------  Aortic valve VTI, S                      96.9  cm       ----------  Aortic mean gradient, S                  31    mm Hg    ----------  Aortic peak gradient, S                  59    mm Hg    ----------  VTI ratio, LVOT/AV                       0.21           ----------  Aortic valve area, VTI                   0.67  cm^2     ----------  Aortic valve area/bsa, VTI               0.24  cm^2/m^2 ----------  Velocity ratio, mean, LVOT/AV            0.15           ----------  Aortic valve area, mean velocity         0.48  cm^2     ----------  Aortic valve area/bsa, mean              0.17  cm^2/m^2 ----------  velocity  Aortic regurg pressure half-time         299   ms       ----------    Aorta                                    Value          Reference  Ascending aorta ID, A-P, S               29    mm       ----------    Left atrium                              Value          Reference  LA ID, A-P, ES                           56    mm       ----------  LA ID/bsa, A-P  2.01  cm/m^2   <=2.2  LA volume, S                             111   ml       ----------  LA volume/bsa, S                         39.9  ml/m^2   ----------  LA volume, ES, 1-p A4C                   156   ml       ----------  LA volume/bsa, ES, 1-p A4C               56.1  ml/m^2   ----------  LA volume, ES, 1-p A2C                   81.2  ml       ----------  LA volume/bsa, ES, 1-p A2C               29.2  ml/m^2   ----------    Mitral valve                             Value          Reference  Mitral E-wave peak velocity              102   cm/s     ----------  Mitral A-wave peak velocity              46.1  cm/s     ----------  Mitral deceleration time         (L)     120   ms       150 - 230  Mitral pressure half-time                35    ms       ----------  Mitral peak gradient, D                  4     mm Hg    ----------  Mitral E/A ratio, peak                   2.2            ----------  Mitral valve area, PHT, DP               6.29  cm^2     ----------  Mitral valve area/bsa, PHT, DP           2.26  cm^2/m^2 ----------    Pulmonary arteries                       Value          Reference  PA pressure, S, DP               (H)     63    mm Hg    <=30    Tricuspid valve                          Value          Reference  Tricuspid regurg peak velocity  295   cm/s     ----------  Tricuspid peak RV-RA gradient            35    mm Hg    ----------    Right atrium                             Value          Reference  RA ID, S-I, ES, A4C              (H)     60.9  mm       34 - 49  RA area, ES, A4C                 (H)     28    cm^2     8.3 - 19.5  RA volume, ES, A/L                       103   ml       ----------  RA volume/bsa, ES, A/L                   37.1  ml/m^2   ----------    Right ventricle                          Value          Reference  TAPSE                                    20.6  mm       ----------  RV s&', lateral, S                        9     cm/s      ----------    Pulmonic valve                           Value          Reference  Pulmonic valve peak velocity, S          68.9  cm/s     ----------  STS RISK CALCULATOR: Risk of Mortality: 14.212% Renal Failure: NA Permanent Stroke: 1.422% Prolonged Ventilation: 61.898% DSW Infection: 1.414% Reoperation: 3.976% Morbidity or Mortality: 61.877% Short Length of Stay: 4.530% Long Length of Stay: 45.287%   ASSESSMENT AND PLAN: 57 yo male with multiple comorbid medical conditions presents with findings suspicious for severe low flow low gradient aortic stenosis and associated chronic systolic heart failure with New York Heart Association functional class III symptoms.  His serious comorbid medical conditions include end-stage renal disease, diabetes, morbid obesity with BMI greater than 40, long-standing tobacco abuse with COPD, multivessel coronary artery disease, and severe LV systolic dysfunction. 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 This has been seen on a CTA and TEE studies. I reviewed his CTA findings from March 2018 and the study was of very poor quality.   I have reviewed the natural history of aortic stenosis with the patient and their family members who are present today. We have discussed the limitations of medical therapy and the  poor prognosis associated with symptomatic aortic stenosis. We have reviewed potential treatment options, including palliative medical therapy, conventional surgical aortic valve replacement, and transcatheter aortic valve replacement. We discussed treatment options in the context of the patient's specific comorbid medical conditions.   Considering his progressive symptoms and worsening LV dysfunction, it seems reasonable to proceed with further evaluation and consideration of treatment options for aortic stenosis. I have recommended a right and left heart catheterization to better  assess hemodynamics and coronary anatomy. He has known chronic occlusion of an OM branch of the circumflex and moderate RCA stenosis. Will also perform and aortic root angiogram to assess for sinus of valsalva aneurysm. I have reviewed the risks, indications, and alternatives to cardiac catheterization, possible angioplasty, and stenting with the patient. Risks include but are not limited to bleeding, infection, vascular injury, stroke, myocardial infection, arrhythmia, kidney injury, radiation-related injury in the case of prolonged fluoroscopy use, emergency cardiac surgery, and death. The patient understands the risks of serious complication is 1-2 in 1000 with diagnostic cardiac cath and 1-2% or less with angioplasty/stenting.   After his catheterization is completed, will arrange CTA studies of the heart as well as the chest/abdomen/pelvis. Further evaluation by the multidisciplinary heart valve team after his studies are completed.   Current medicines are reviewed with the patient today.  The patient does not have concerns regarding medicines.  Labs/ tests ordered today include:  No orders of the defined types were placed in this encounter.   Disposition:   FU pending test results  Signed, [name], MD  07/30/2017 9:38 AM    [facility] [address] [place], [place], [place]  [address] Phone: [phone]; Fax: [phone]

## 2017-07-30 NOTE — Patient Instructions (Addendum)
Medication Instructions:  Your provider recommends that you continue on your current medications as directed. Please refer to the Current Medication list given to you today.    Labwork: TODAY: BMET, CBC, INR  Testing/Procedures: Your physician has requested that you have a cardiac catheterization. Cardiac catheterization is used to diagnose and/or treat various heart conditions. Doctors may recommend this procedure for a number of different reasons. The most common reason is to evaluate chest pain. Chest pain can be a symptom of coronary artery disease (CAD), and cardiac catheterization can show whether plaque is narrowing or blocking your heart's arteries. This procedure is also used to evaluate the valves, as well as measure the blood flow and oxygen levels in different parts of your heart. For further information please visit [email or web address] Please follow instruction sheet, as given.  Follow-Up: Lauren, the TAVR nurse, will be in touch to arrange appointments!  Any Other Special Instructions Will Be Listed Below (If Applicable).   [facility] CARDIOVASCULAR DIVISION [facility] [address] OFFICE [address] [place] [address] Dept: [phone] Loc: [phone]  Kendrick Ledyard  07/30/2017  You are scheduled for a Cardiac Catheterization on Friday, March 22 with Dr. [name].  1. Please arrive at the [facility] (Main Entrance A) at [facility]: [address] [place], [place] [address] at 5:30 AM (two hours before your procedure to ensure your preparation). Free valet parking service is available.   Special note: Every effort is made to have your procedure done on time. Please understand that emergencies sometimes delay scheduled procedures.  2. Diet: Do not eat or drink anything after midnight prior to your procedure except sips of water to take medications.  3. Labs: TODAY!  4. Medication instructions in  preparation for your procedure:  1) MAKE SURE TO TAKE YOUR ASPIRIN AND PLAVIX.   2) HOLD Torsemide (Demedex) the morning of your procedure.  3) You may take your other medications with sips of water.   5. Plan for one night stay--bring personal belongings. 6. Bring a current list of your medications and current insurance cards. 7. You MUST have a responsible person to drive you home. 8. Someone MUST be with you the first 24 hours after you arrive home or your discharge will be delayed. 9. Please wear clothes that are easy to get on and off and wear slip-on shoes.  Thank you for allowing [place] to care for you!   -- [facility]    If you need a refill on your cardiac medications before your next appointment, please call your pharmacy.

## 2017-07-30 NOTE — H&P (View-Only) (Signed)
Cardiology Office Note Date:  07/30/2017   ID:  Justin Fitzgerald, DOB May 27, 1960, MRN [id number]  PCP:  Dorothey Baseman, MD  Cardiologist:  Julien Nordmann, MD  Chief Complaint  Patient presents with  . Shortness of Breath   History of Present Illness: Justin Fitzgerald is a 57 y.o. male who presents for evaluation of severe aortic stenosis, referred by Dr Mariah Milling.  He's here with his wife today.  He is limited by shortness of breath with minimal activity.  The patient is in a wheelchair today.  Has been a lifelong smoker, trying to quit, down to 2 cigarettes/day.   The patient had a stroke about one year ago, presenting with slurred speech, left hand weakness, and facial droop. He reports no residual deficit.   The patient reports progressive shortness of breath with exertion. He now is limited by dyspnea with short distance walking. He notes some improvement with use of inhalers. He reports occasional chest discomfort described as a pressure in his upper chest, not associated with dialysis or with any particular activity. He denies syncope, but has occasional lightheadedness especially if he 'walks too much.'   The patient has ESRD and has been on dialysis for over 10 years. He did peritoneal dialysis for approximately 9 years and has been on hemodialysis now for 2 years.    Past Medical History:  Diagnosis Date  . CHF (congestive heart failure) (HCC)   . COPD (chronic obstructive pulmonary disease) (HCC)   . Dialysis patient (HCC)    Tues. Thurs. Sat.  [name] Dyspnea    with exertion  . ESRD (end stage renal disease) on dialysis (HCC)    "TTS; [facility] [place]" (06/22/2016)  . History of gout   . Hyperlipidemia   . Hypertension   . Peripheral vascular disease (HCC)   . Stroke (HCC) 06/20/2016   denies residual on 06/22/2016  . Type II diabetes mellitus (HCC)    diet control  . Umbilical hernia     Past Surgical History:  Procedure Laterality Date  . A/V  FISTULAGRAM Left 12/05/2016   Procedure: A/V Fistulagram;  Surgeon: Renford Dills, MD;  Location: [facility] INVASIVE CV LAB;  Service: Cardiovascular;  Laterality: Left;  . AV FISTULA PLACEMENT Left 07/28/2016   Procedure: ARTERIOVENOUS (AV) FISTULA CREATION;  Surgeon: Renford Dills, MD;  Location: [facility] ORS;  Service: Vascular;  Laterality: Left;  . CARDIAC CATHETERIZATION    . DIALYSIS/PERMA CATHETER INSERTION N/A 06/28/2016   Procedure: Dialysis/Perma Catheter Insertion;  Surgeon: Renford Dills, MD;  Location: [facility] INVASIVE CV LAB;  Service: Cardiovascular;  Laterality: N/A;  . DIALYSIS/PERMA CATHETER INSERTION N/A 08/25/2016   Procedure: Dialysis/Perma Catheter Insertion;  Surgeon: Annice Needy, MD;  Location: [facility] INVASIVE CV LAB;  Service: Cardiovascular;  Laterality: N/A;  . DIALYSIS/PERMA CATHETER INSERTION N/A 09/08/2016   Procedure: Dialysis/Perma Catheter Insertion;  Surgeon: Renford Dills, MD;  Location: [facility] INVASIVE CV LAB;  Service: Cardiovascular;  Laterality: N/A;  . DIALYSIS/PERMA CATHETER REMOVAL N/A 12/25/2016   Procedure: DIALYSIS/PERMA CATHETER REMOVAL;  Surgeon: Annice Needy, MD;  Location: [facility] INVASIVE CV LAB;  Service: Cardiovascular;  Laterality: N/A;  . EXTERIORIZATION OF A CONTINUOUS AMBULATORY PERITONEAL DIALYSIS CATHETER    . FRACTURE SURGERY    . IR GENERIC HISTORICAL  08/07/2016   IR VENIPUNCTURE 38YRS/OLDER BY MD 08/07/2016 Jolaine Click, MD [facility]INTER[facility]  . IR GENERIC HISTORICAL  08/07/2016   IR US GUIDE VASC ACCESS RIGHT 08/07/2016 Jolaine Click, MD [facility]  .  PERIPHERAL VASCULAR CATHETERIZATION N/A 03/08/2016   Procedure: Dialysis/Perma Catheter Insertion;  Surgeon: Renford Dills, MD;  Location: [facility] INVASIVE CV LAB;  Service: Cardiovascular;  Laterality: N/A;  . PERIPHERAL VASCULAR CATHETERIZATION N/A 04/20/2016   Procedure: Dialysis/Perma Catheter Removal;  Surgeon: Annice Needy, MD;  Location: [facility] INVASIVE CV LAB;  Service: Cardiovascular;   Laterality: N/A;  . PERIPHERAL VASCULAR CATHETERIZATION N/A 05/25/2016   Procedure: Dialysis/Perma Catheter Insertion;  Surgeon: Annice Needy, MD;  Location: [facility] INVASIVE CV LAB;  Service: Cardiovascular;  Laterality: N/A;  . REMOVAL OF A DIALYSIS CATHETER N/A 09/29/2016   Procedure: REMOVAL OF A DIALYSIS CATHETER ( PERITONEAL DIALYSIS CATH );  Surgeon: Renford Dills, MD;  Location: [facility] ORS;  Service: Vascular;  Laterality: N/A;  . RIGHT/LEFT HEART CATH AND CORONARY ANGIOGRAPHY N/A 06/23/2016   Procedure: Right/Left Heart Cath and Coronary Angiography;  Surgeon: [name], MD;  Location: [facility] INVASIVE CV LAB;  Service: Cardiovascular;  Laterality: N/A;  . TEE WITHOUT CARDIOVERSION N/A 06/26/2016   Procedure: TRANSESOPHAGEAL ECHOCARDIOGRAM (TEE);  Surgeon: Pricilla Riffle, MD;  Location: [facility] ENDOSCOPY;  Service: Cardiovascular;  Laterality: N/A;  . TEE WITHOUT CARDIOVERSION N/A 02/07/2017   Procedure: TRANSESOPHAGEAL ECHOCARDIOGRAM (TEE);  Surgeon: Antonieta Iba, MD;  Location: [facility] ORS;  Service: Cardiovascular;  Laterality: N/A;  . WRIST FRACTURE SURGERY Right ~ 2000   "got 8 screws in there"    Current Outpatient Medications  Medication Sig Dispense Refill  . acetaminophen (TYLENOL) 500 MG tablet Take 1,000 mg by mouth every 6 (six) hours as needed for mild pain or headache.    . albuterol (PROVENTIL) (2.5 MG/3ML) 0.083% nebulizer solution Take 3 mLs (2.5 mg total) by nebulization every 6 (six) hours as needed for wheezing or shortness of breath. 75 mL 12  . aspirin EC 81 MG EC tablet Take 1 tablet (81 mg total) by mouth daily. 90 tablet 3  . atorvastatin (LIPITOR) 80 MG tablet Take 1 tablet (80 mg total) by mouth daily at 6 PM. 60 tablet 1  . beclomethasone (QVAR) 40 MCG/ACT inhaler Inhale 1 puff into the lungs 2 (two) times daily. 1 Inhaler 0  . carvedilol (COREG) 3.125 MG tablet Take 1 tablet (3.125 mg total) by mouth 2 (two) times daily with a meal. 60 tablet 1  . cinacalcet  (SENSIPAR) 30 MG tablet Take 60 mg by mouth 3 (three) times a week. (on Tuesdays, Thursdays, and Saturdays)    . clopidogrel (PLAVIX) 75 MG tablet Take 75 mg by mouth daily.    [name] ezetimibe (ZETIA) 10 MG tablet Take 1 tablet (10 mg total) by mouth daily. 90 tablet 3  . gabapentin (NEURONTIN) 300 MG capsule Take 300 mg by mouth at bedtime.     . lidocaine-prilocaine (EMLA) cream Use as directed for dialysis.  99  . midodrine (PROAMATINE) 5 MG tablet Take 1 tablet by mouth See admin instructions. Pt to take only for low BP  6  . sevelamer carbonate (RENVELA) 2.4 g PACK Take 2.4 g by mouth 3 (three) times daily with meals. (Patient taking differently: Take 2.4 g by mouth daily. WITH A SNACK) 90 each 0  . SPIRIVA HANDIHALER 18 MCG inhalation capsule Place 18 mcg into inhaler and inhale daily.     . SYMBICORT 160-4.5 MCG/ACT inhaler Inhale 2 puffs into the lungs 2 (two) times daily.  5  . torsemide (DEMADEX) 100 MG tablet Take 100 mg by mouth 2 (two) times daily.     No current facility-administered  medications for this visit.     Allergies:   Ramipril and Aspirin   Social History:  The patient  reports that he has been smoking cigarettes.  He has a 20.00 pack-year smoking history. He has quit using smokeless tobacco. His smokeless tobacco use included chew. He reports that he does not drink alcohol or use drugs.   Family History:  The patient's  family history includes CAD in his mother; Hypertension in his mother.    ROS:  Please see the history of present illness.  Otherwise, review of systems is positive for leg pain, fatigue. All other systems are reviewed and negative.    PHYSICAL EXAM: VS:  BP (!) 80/52   Pulse 72   Ht 6' (1.829 m)   Wt (!) 321 lb (145.6 kg)   BMI 43.54 kg/m  , BMI Body mass index is 43.54 kg/m. GEN: Pleasant obese male, in no acute distress  HEENT: normal  Neck: no JVD, no masses. Bilateral carotid bruits Cardiac: RRR with 3/6 harsh systolic murmur at the  LLSB/apex, crescendo/decrescendo            Respiratory:  clear to auscultation bilaterally, normal work of breathing GI: soft, nontender, nondistended, + BS MS: no deformity or atrophy  Ext: trace bilateral pretibial edema Skin: warm and dry, no rash Neuro:  Strength and sensation are intact Psych: euthymic mood, full affect  EKG:  EKG is not ordered today.  Recent Labs: 06/26/2017: BUN 38; Creatinine, Ser 6.51; Hemoglobin 12.3; Platelets 192; Potassium 4.4; Sodium 135   Lipid Panel     Component Value Date/Time   CHOL 170 06/22/2016 0235   TRIG 185 (H) 06/22/2016 0235   HDL 35 (L) 06/22/2016 0235   CHOLHDL 4.9 06/22/2016 0235   VLDL 37 06/22/2016 0235   LDLCALC 98 06/22/2016 0235      Wt Readings from Last 3 Encounters:  07/30/17 (!) 321 lb (145.6 kg)  07/02/17 (!) 319 lb 8 oz (144.9 kg)  06/26/17 (!) 315 lb (142.9 kg)     Cardiac Studies Reviewed: 06/23/2016 Cath: Conclusion   1. Heavily calcified coronary arteries with chronic occlusion of the first OM branch of the circumflex, moderate diffuse RCA stenosis, and mild nonobstructive LAD stenosis 2. Normal right heart hemodynamics and normal LVEDP 3. Known severe LV systolic dysfunction  Recommend: Medical therapy for CAD and cardiomyopathy. 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  Indications   Chronic systolic heart failure (HCC) [I50.22 (ICD-10-CM)]  Procedural Details/Technique   Technical Details INDICATION: CHF, Severe LV dysfunction with LVEF < 30%  PROCEDURAL DETAILS: The right groin was prepped, draped, and anesthetized with 1% lidocaine. Using the modified Seldinger technique a 5 French sheath was placed in the right femoral artery and a 7 French sheath was placed in the right femoral vein. A Swan-Ganz catheter was used for the right heart catheterization. Standard protocol was followed for recording of right heart pressures and sampling of oxygen saturations.  Fick cardiac output was calculated. Standard Judkins catheters were used for selective coronary angiography. LV pressure is recorded with the JR4 catheter. There were no immediate procedural complications. The patient was transferred to the post catheterization recovery area for further monitoring.   Estimated blood loss <50 mL.  During this procedure the patient was administered the following to achieve and maintain moderate conscious sedation: Versed 2 mg, Fentanyl 100 mcg, while the patient's heart rate, blood pressure, and oxygen saturation were continuously monitored. The period  of conscious sedation was 20 minutes, of which I was present face-to-face 100% of this time.  Coronary Findings   Diagnostic  Dominance: Right  Left Anterior Descending  Mid LAD lesion 40% stenosed  The lesion is calcified. diffuse calcific LAD stenosis, nonobstructive  Left Circumflex  First Obtuse Marginal Branch  1st Mrg lesion 100% stenosed  The lesion is calcified. chronic OM occlusion, heavy calcification  Right Coronary Artery  Prox RCA lesion 60% stenosed  The lesion is calcified.  Mid RCA to Dist RCA lesion 50% stenosed  The lesion is calcified.  Intervention   No interventions have been documented.  Right Heart   Right Heart Pressures LV EDP is normal.  Coronary Diagrams   Diagnostic Diagram       Implants     No implant documentation for this case.  MERGE Images   Show images for Cardiac catheterization   Link to Procedure Log   Procedure Log    Hemo Data    Most Recent Value  Fick Cardiac Output 9.18 L/min  Fick Cardiac Output Index 3.6 (L/min)/BSA  Aortic Mean Gradient 26.7 mmHg  Aortic Peak Gradient 30 mmHg  Aortic Valve Area 1.71  Aortic Value Area Index 0.67 cm2/BSA  RA A Wave 9 mmHg  RA V Wave 10 mmHg  RA Mean 6 mmHg  RV Systolic Pressure 36 mmHg  RV Diastolic Pressure 4 mmHg  RV EDP 9 mmHg  PA Systolic Pressure 32 mmHg  PA Diastolic Pressure 7 mmHg  PA Mean  20 mmHg  PW A Wave 12 mmHg  PW V Wave 11 mmHg  PW Mean 9 mmHg  AO Systolic Pressure 104 mmHg  AO Diastolic Pressure 68 mmHg  AO Mean 81 mmHg  LV Systolic Pressure 125 mmHg  LV Diastolic Pressure 8 mmHg  LV EDP 14 mmHg  Arterial Occlusion Pressure Extended Systolic Pressure 94 mmHg  Arterial Occlusion Pressure Extended Diastolic Pressure 65 mmHg  Arterial Occlusion Pressure Extended Mean Pressure 79 mmHg  Left Ventricular Apex Extended Systolic Pressure 124 mmHg  Left Ventricular Apex Extended Diastolic Pressure 7 mmHg  Left Ventricular Apex Extended EDP Pressure 12 mmHg  QP/QS 1  TPVR Index 5.28 HRUI  TSVR Index 22.5 HRUI  PVR SVR Ratio 0.13  TPVR/TSVR Ratio 0.23   TEE 02/07/2017: Study Conclusions  - Left ventricle: Systolic function was mildly reduced. The   estimated ejection fraction was in the range of 45% to 50%. Wall   motion was normal; there were no regional wall motion   abnormalities. - Aortic valve: Cusp separation was moderately reduced. There was   moderate to severe regurgitation. Previously noted &quot;pocket&quot;   between the non-coronary and left coronary cusp has essentially   resolved. Minimal traces remain. - Mitral valve: There was mild regurgitation. - Left atrium: The atrium was moderately dilated. No evidence of   thrombus in the atrial cavity or appendage. - Right atrium: The atrium was mildly dilated. No evidence of   thrombus in the atrial cavity or appendage. - Atrial septum: No defect or patent foramen ovale was identified. - Tricuspid valve: There was moderate regurgitation directed   eccentrically and toward the septum. - Pulmonary arteries: PA pressure were moderately elevated, PA peak   pressure: 57 mm Hg (S).  Impressions:  - LV systolic function appears to have improved compared to   previous study in 06/2016.  Echo 07/13/2017: Left ventricle:  The cavity size was mildly dilated. There was moderate concentric hypertrophy.  Systolic function was  severely reduced. The estimated ejection fraction was in the range of 20% to 25%. Wall motion was normal; there were no regional wall motion abnormalities. Doppler parameters are consistent with a reversible restrictive pattern, indicative of decreased left ventricular diastolic compliance and/or increased left atrial pressure (grade 3 diastolic dysfunction).  ------------------------------------------------------------------- Aortic valve:  Poorly visualized. severely calcified leaflets. Mobility was not restricted.  Doppler:  Aortic valve stenosis appears severe. Measurements are likely under-estimated secondary to severely reduced LV function. There was mild regurgitation. VTI ratio of LVOT to aortic valve: 0.21. Valve area (VTI): 0.67 cm^2. Indexed valve area (VTI): 0.24 cm^2/m^2. Mean velocity ratio of LVOT to aortic valve: 0.15. Valve area (Vmean): 0.48 cm^2. Indexed valve area (Vmean): 0.17 cm^2/m^2.    Mean gradient (S): 31 mm Hg. Peak gradient (S): 59 mm Hg.  ------------------------------------------------------------------- Aorta:  Aortic root: The aortic root was normal in size.  ------------------------------------------------------------------- Mitral valve:   Calcified annulus. Mobility was not restricted. Doppler:  Transvalvular velocity was within the normal range. There was no evidence for stenosis. There was no regurgitation.    Valve area by pressure half-time: 6.29 cm^2. Indexed valve area by pressure half-time: 2.26 cm^2/m^2.    Peak gradient (D): 4 mm Hg.   ------------------------------------------------------------------- Left atrium:  The atrium was moderately dilated.  ------------------------------------------------------------------- Right ventricle:  The cavity size was mildly dilated. Wall thickness was normal. Systolic function was normal.  ------------------------------------------------------------------- Pulmonic  valve:    Structurally normal valve.   Cusp separation was normal.  Doppler:  Transvalvular velocity was within the normal range. There was no evidence for stenosis. There was no regurgitation.  ------------------------------------------------------------------- Tricuspid valve:   Structurally normal valve.    Doppler: Transvalvular velocity was within the normal range. There was mild-moderate regurgitation.  ------------------------------------------------------------------- Pulmonary artery:   The main pulmonary artery was normal-sized. Systolic pressure was moderate to severely elevated.  ------------------------------------------------------------------- Right atrium:  The atrium was normal in size.  ------------------------------------------------------------------- Pericardium:  There was no pericardial effusion.  ------------------------------------------------------------------- Systemic veins: Inferior vena cava: The vessel was normal in size.  ------------------------------------------------------------------- Measurements   Left ventricle                           Value          Reference  LV ID, ED, PLAX chordal                  47    mm       43 - 52  LV ID, ES, PLAX chordal          (H)     42    mm       23 - 38  LV fx shortening, PLAX chordal   (L)     11    %        >=29  LV PW thickness, ED                      14    mm       ----------  IVS/LV PW ratio, ED                      1.14           <=1.3  Stroke volume, 2D                        65  ml       ----------  Stroke volume/bsa, 2D                    23    ml/m^2   ----------  LV e&', lateral                           6.81  cm/s     ----------  LV E/e&', lateral                         14.98          ----------  LV e&', medial                            3.57  cm/s     ----------  LV E/e&', medial                          28.57          ----------  LV e&', average                           5.19   cm/s     ----------  LV E/e&', average                         19.65          ----------    Ventricular septum                       Value          Reference  IVS thickness, ED                        16    mm       ----------    LVOT                                     Value          Reference  LVOT ID, S                               20    mm       ----------  LVOT area                                3.14  cm^2     ----------  LVOT ID                                  20    mm       ----------  LVOT mean velocity, S                    58.7  cm/s     ----------  LVOT VTI, S  20.6  cm       ----------  Stroke volume (SV), LVOT DP              64.7  ml       ----------  Stroke index (SV/bsa), LVOT DP           23.3  ml/m^2   ----------    Aortic valve                             Value          Reference  Aortic valve mean velocity, S            384   cm/s     ----------  Aortic valve VTI, S                      96.9  cm       ----------  Aortic mean gradient, S                  31    mm Hg    ----------  Aortic peak gradient, S                  59    mm Hg    ----------  VTI ratio, LVOT/AV                       0.21           ----------  Aortic valve area, VTI                   0.67  cm^2     ----------  Aortic valve area/bsa, VTI               0.24  cm^2/m^2 ----------  Velocity ratio, mean, LVOT/AV            0.15           ----------  Aortic valve area, mean velocity         0.48  cm^2     ----------  Aortic valve area/bsa, mean              0.17  cm^2/m^2 ----------  velocity  Aortic regurg pressure half-time         299   ms       ----------    Aorta                                    Value          Reference  Ascending aorta ID, A-P, S               29    mm       ----------    Left atrium                              Value          Reference  LA ID, A-P, ES                           56    mm       ----------  LA ID/bsa, A-P  2.01  cm/m^2   <=2.2  LA volume, S                             111   ml       ----------  LA volume/bsa, S                         39.9  ml/m^2   ----------  LA volume, ES, 1-p A4C                   156   ml       ----------  LA volume/bsa, ES, 1-p A4C               56.1  ml/m^2   ----------  LA volume, ES, 1-p A2C                   81.2  ml       ----------  LA volume/bsa, ES, 1-p A2C               29.2  ml/m^2   ----------    Mitral valve                             Value          Reference  Mitral E-wave peak velocity              102   cm/s     ----------  Mitral A-wave peak velocity              46.1  cm/s     ----------  Mitral deceleration time         (L)     120   ms       150 - 230  Mitral pressure half-time                35    ms       ----------  Mitral peak gradient, D                  4     mm Hg    ----------  Mitral E/A ratio, peak                   2.2            ----------  Mitral valve area, PHT, DP               6.29  cm^2     ----------  Mitral valve area/bsa, PHT, DP           2.26  cm^2/m^2 ----------    Pulmonary arteries                       Value          Reference  PA pressure, S, DP               (H)     63    mm Hg    <=30    Tricuspid valve                          Value          Reference  Tricuspid regurg peak velocity  295   cm/s     ----------  Tricuspid peak RV-RA gradient            35    mm Hg    ----------    Right atrium                             Value          Reference  RA ID, S-I, ES, A4C              (H)     60.9  mm       34 - 49  RA area, ES, A4C                 (H)     28    cm^2     8.3 - 19.5  RA volume, ES, A/L                       103   ml       ----------  RA volume/bsa, ES, A/L                   37.1  ml/m^2   ----------    Right ventricle                          Value          Reference  TAPSE                                    20.6  mm       ----------  RV s&', lateral, S                        9     cm/s      ----------    Pulmonic valve                           Value          Reference  Pulmonic valve peak velocity, S          68.9  cm/s     ----------  STS RISK CALCULATOR: Risk of Mortality: 14.212% Renal Failure: NA Permanent Stroke: 1.422% Prolonged Ventilation: 61.898% DSW Infection: 1.414% Reoperation: 3.976% Morbidity or Mortality: 61.877% Short Length of Stay: 4.530% Long Length of Stay: 45.287%   ASSESSMENT AND PLAN: 57 yo male with multiple comorbid medical conditions presents with findings suspicious for severe low flow low gradient aortic stenosis and associated chronic systolic heart failure with New York Heart Association functional class III symptoms.  His serious comorbid medical conditions include end-stage renal disease, diabetes, morbid obesity with BMI greater than 40, long-standing tobacco abuse with COPD, multivessel coronary artery disease, and severe LV systolic dysfunction. 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 This has been seen on a CTA and TEE studies. I reviewed his CTA findings from March 2018 and the study was of very poor quality.   I have reviewed the natural history of aortic stenosis with the patient and their family members who are present today. We have discussed the limitations of medical therapy and the  poor prognosis associated with symptomatic aortic stenosis. We have reviewed potential treatment options, including palliative medical therapy, conventional surgical aortic valve replacement, and transcatheter aortic valve replacement. We discussed treatment options in the context of the patient's specific comorbid medical conditions.   Considering his progressive symptoms and worsening LV dysfunction, it seems reasonable to proceed with further evaluation and consideration of treatment options for aortic stenosis. I have recommended a right and left heart catheterization to better  assess hemodynamics and coronary anatomy. He has known chronic occlusion of an OM branch of the circumflex and moderate RCA stenosis. Will also perform and aortic root angiogram to assess for sinus of valsalva aneurysm. I have reviewed the risks, indications, and alternatives to cardiac catheterization, possible angioplasty, and stenting with the patient. Risks include but are not limited to bleeding, infection, vascular injury, stroke, myocardial infection, arrhythmia, kidney injury, radiation-related injury in the case of prolonged fluoroscopy use, emergency cardiac surgery, and death. The patient understands the risks of serious complication is 1-2 in 1000 with diagnostic cardiac cath and 1-2% or less with angioplasty/stenting.   After his catheterization is completed, will arrange CTA studies of the heart as well as the chest/abdomen/pelvis. Further evaluation by the multidisciplinary heart valve team after his studies are completed.   Current medicines are reviewed with the patient today.  The patient does not have concerns regarding medicines.  Labs/ tests ordered today include:  No orders of the defined types were placed in this encounter.   Disposition:   FU pending test results  Signed, [name], MD  07/30/2017 9:38 AM    [facility] [address] [place], [place], [place]  [address] Phone: [phone]; Fax: [phone]

## 2017-08-02 ENCOUNTER — Other Ambulatory Visit: Payer: Self-pay | Admitting: Cardiovascular Disease

## 2017-08-02 ENCOUNTER — Telehealth: Payer: Self-pay | Admitting: *Deleted

## 2017-08-02 ENCOUNTER — Other Ambulatory Visit: Payer: Self-pay

## 2017-08-02 DIAGNOSIS — I350 Nonrheumatic aortic (valve) stenosis: Secondary | ICD-10-CM

## 2017-08-02 NOTE — Telephone Encounter (Signed)
Pt contacted pre-catheterization scheduled at [facility] for: Friday March 22,2019 7:30 AM Verified arrival time and place: [facility] Main Entrance A/[facility] at: 5:30 AM Do not eat or drink after midnight prior to cath. Verified allergies in Epic. Verified no diabetes medications.  Hold: Torsemide AM of cath.  Except hold medications  AM meds can be  taken pre-cath with sip of water including: ASA 81 mg  Plavix 75 mg  Confirmed patient has responsible person to drive home post procedure and observe patient for 24 hours: yes

## 2017-08-02 NOTE — Progress Notes (Signed)
error 

## 2017-08-03 ENCOUNTER — Ambulatory Visit (HOSPITAL_COMMUNITY)
Admission: RE | Disposition: A | Discharge status: Home / Self Care | Payer: Self-pay | Source: Ambulatory Visit | Attending: Cardiovascular Disease

## 2017-08-03 ENCOUNTER — Encounter (HOSPITAL_COMMUNITY): Payer: Self-pay | Admitting: Cardiovascular Disease

## 2017-08-03 ENCOUNTER — Ambulatory Visit (HOSPITAL_COMMUNITY)
Admission: RE | Admit: 2017-08-03 | Discharge: 2017-08-03 | Disposition: A | Discharge status: Home / Self Care | Payer: Medicare Other | Source: Ambulatory Visit | Attending: Cardiovascular Disease | Admitting: Cardiovascular Disease

## 2017-08-03 ENCOUNTER — Other Ambulatory Visit: Payer: Self-pay

## 2017-08-03 DIAGNOSIS — I2584 Coronary atherosclerosis due to calcified coronary lesion: Secondary | ICD-10-CM | POA: Insufficient documentation

## 2017-08-03 DIAGNOSIS — F17210 Nicotine dependence, cigarettes, uncomplicated: Secondary | ICD-10-CM | POA: Diagnosis not present

## 2017-08-03 DIAGNOSIS — I2582 Chronic total occlusion of coronary artery: Secondary | ICD-10-CM | POA: Insufficient documentation

## 2017-08-03 DIAGNOSIS — I2510 Atherosclerotic heart disease of native coronary artery without angina pectoris: Secondary | ICD-10-CM | POA: Insufficient documentation

## 2017-08-03 DIAGNOSIS — M109 Gout, unspecified: Secondary | ICD-10-CM | POA: Insufficient documentation

## 2017-08-03 DIAGNOSIS — E785 Hyperlipidemia, unspecified: Secondary | ICD-10-CM | POA: Insufficient documentation

## 2017-08-03 DIAGNOSIS — Z7902 Long term (current) use of antithrombotics/antiplatelets: Secondary | ICD-10-CM | POA: Diagnosis not present

## 2017-08-03 DIAGNOSIS — J449 Chronic obstructive pulmonary disease, unspecified: Secondary | ICD-10-CM | POA: Diagnosis not present

## 2017-08-03 DIAGNOSIS — I350 Nonrheumatic aortic (valve) stenosis: Secondary | ICD-10-CM

## 2017-08-03 DIAGNOSIS — I5022 Chronic systolic (congestive) heart failure: Secondary | ICD-10-CM | POA: Insufficient documentation

## 2017-08-03 DIAGNOSIS — I272 Pulmonary hypertension, unspecified: Secondary | ICD-10-CM | POA: Insufficient documentation

## 2017-08-03 DIAGNOSIS — E1151 Type 2 diabetes mellitus with diabetic peripheral angiopathy without gangrene: Secondary | ICD-10-CM | POA: Insufficient documentation

## 2017-08-03 DIAGNOSIS — I132 Hypertensive heart and chronic kidney disease with heart failure and with stage 5 chronic kidney disease, or end stage renal disease: Secondary | ICD-10-CM | POA: Insufficient documentation

## 2017-08-03 DIAGNOSIS — Z992 Dependence on renal dialysis: Secondary | ICD-10-CM | POA: Diagnosis not present

## 2017-08-03 DIAGNOSIS — I9589 Other hypotension: Secondary | ICD-10-CM | POA: Insufficient documentation

## 2017-08-03 DIAGNOSIS — Z6841 Body Mass Index (BMI) 40.0 and over, adult: Secondary | ICD-10-CM | POA: Diagnosis not present

## 2017-08-03 DIAGNOSIS — N186 End stage renal disease: Secondary | ICD-10-CM | POA: Insufficient documentation

## 2017-08-03 DIAGNOSIS — Z8249 Family history of ischemic heart disease and other diseases of the circulatory system: Secondary | ICD-10-CM | POA: Insufficient documentation

## 2017-08-03 DIAGNOSIS — R0609 Other forms of dyspnea: Secondary | ICD-10-CM

## 2017-08-03 DIAGNOSIS — Z8673 Personal history of transient ischemic attack (TIA), and cerebral infarction without residual deficits: Secondary | ICD-10-CM | POA: Insufficient documentation

## 2017-08-03 DIAGNOSIS — I352 Nonrheumatic aortic (valve) stenosis with insufficiency: Secondary | ICD-10-CM | POA: Diagnosis present

## 2017-08-03 DIAGNOSIS — E1122 Type 2 diabetes mellitus with diabetic chronic kidney disease: Secondary | ICD-10-CM | POA: Insufficient documentation

## 2017-08-03 DIAGNOSIS — Z7982 Long term (current) use of aspirin: Secondary | ICD-10-CM | POA: Diagnosis not present

## 2017-08-03 HISTORY — PX: RIGHT/LEFT HEART CATH AND CORONARY ANGIOGRAPHY: CATH118266

## 2017-08-03 LAB — POCT I-STAT 3, VENOUS BLOOD GAS (G3P V)
Bicarbonate: 25.8 mmol/L (ref 20.0–28.0)
O2 SAT: 68 %
TCO2: 27 mmol/L (ref 22–32)
pCO2, Ven: 45.9 mmHg (ref 44.0–60.0)
pH, Ven: 7.357 (ref 7.250–7.430)
pO2, Ven: 37 mmHg (ref 32.0–45.0)

## 2017-08-03 LAB — POCT I-STAT 3, ART BLOOD GAS (G3+)
ACID-BASE EXCESS: 1 mmol/L (ref 0.0–2.0)
BICARBONATE: 25.8 mmol/L (ref 20.0–28.0)
O2 SAT: 97 %
PO2 ART: 96 mmHg (ref 83.0–108.0)
TCO2: 27 mmol/L (ref 22–32)
pCO2 arterial: 42.2 mmHg (ref 32.0–48.0)
pH, Arterial: 7.395 (ref 7.350–7.450)

## 2017-08-03 LAB — GLUCOSE, CAPILLARY
GLUCOSE-CAPILLARY: 88 mg/dL (ref 65–99)
Glucose-Capillary: 96 mg/dL (ref 65–99)

## 2017-08-03 SURGERY — RIGHT/LEFT HEART CATH AND CORONARY ANGIOGRAPHY
Anesthesia: LOCAL

## 2017-08-03 MED ORDER — ACETAMINOPHEN 325 MG PO TABS
ORAL_TABLET | ORAL | Status: AC
  Filled 2017-08-03: qty 2

## 2017-08-03 MED ORDER — SODIUM CHLORIDE 0.9% FLUSH: 3.0000 mL | INTRAVENOUS | Status: DC | PRN

## 2017-08-03 MED ORDER — SODIUM CHLORIDE 0.9 % IV SOLN: 250.0000 mL | INTRAVENOUS | Status: DC | PRN

## 2017-08-03 MED ORDER — SODIUM CHLORIDE 0.9% FLUSH: 3.0000 mL | Freq: Two times a day (BID) | INTRAVENOUS | Status: DC

## 2017-08-03 MED ORDER — FENTANYL CITRATE (PF) 100 MCG/2ML IJ SOLN
INTRAMUSCULAR | Status: AC
  Filled 2017-08-03: qty 2

## 2017-08-03 MED ORDER — SODIUM CHLORIDE 0.9 % IV SOLN
INTRAVENOUS | Status: DC
  Administered 2017-08-03: 07:00:00 via INTRAVENOUS

## 2017-08-03 MED ORDER — ONDANSETRON HCL 4 MG/2ML IJ SOLN: 4.0000 mg | Freq: Four times a day (QID) | INTRAMUSCULAR | Status: DC | PRN

## 2017-08-03 MED ORDER — MIDAZOLAM HCL 2 MG/2ML IJ SOLN
INTRAMUSCULAR | Status: AC
  Filled 2017-08-03: qty 2

## 2017-08-03 MED ORDER — IOPAMIDOL (ISOVUE-370) INJECTION 76%
INTRAVENOUS | Status: DC | PRN
  Administered 2017-08-03: 65 mL via INTRA_ARTERIAL

## 2017-08-03 MED ORDER — MIDAZOLAM HCL 2 MG/2ML IJ SOLN
INTRAMUSCULAR | Status: DC | PRN
  Administered 2017-08-03: 1 mg via INTRAVENOUS

## 2017-08-03 MED ORDER — HEPARIN (PORCINE) IN NACL 2-0.9 UNIT/ML-% IJ SOLN
INTRAMUSCULAR | Status: AC
  Filled 2017-08-03: qty 500

## 2017-08-03 MED ORDER — HEPARIN (PORCINE) IN NACL 2-0.9 UNIT/ML-% IJ SOLN
INTRAMUSCULAR | Status: AC | PRN
  Administered 2017-08-03 (×2): 500 mL via INTRA_ARTERIAL

## 2017-08-03 MED ORDER — ASPIRIN 81 MG PO CHEW: 81.0000 mg | CHEWABLE_TABLET | ORAL | Status: DC

## 2017-08-03 MED ORDER — IOPAMIDOL (ISOVUE-370) INJECTION 76%
INTRAVENOUS | Status: AC
  Filled 2017-08-03: qty 100

## 2017-08-03 MED ORDER — ACETAMINOPHEN 325 MG PO TABS
650.0000 mg | ORAL_TABLET | ORAL | Status: DC | PRN
  Administered 2017-08-03: 650 mg via ORAL

## 2017-08-03 MED ORDER — LIDOCAINE HCL (PF) 1 % IJ SOLN
INTRAMUSCULAR | Status: AC
  Filled 2017-08-03: qty 30

## 2017-08-03 MED ORDER — LIDOCAINE HCL (PF) 1 % IJ SOLN
INTRAMUSCULAR | Status: DC | PRN
  Administered 2017-08-03: 20 mL

## 2017-08-03 SURGICAL SUPPLY — 11 items
CATH INFINITI 5FR MULTPACK ANG (CATHETERS) ×2 IMPLANT
CATH SWAN GANZ 7F STRAIGHT (CATHETERS) ×2 IMPLANT
COVER PRB 48X5XTLSCP FOLD TPE (BAG) ×1 IMPLANT
COVER PROBE 5X48 (BAG) ×1
KIT HEART LEFT (KITS) ×2 IMPLANT
PACK CARDIAC CATHETERIZATION (CUSTOM PROCEDURE TRAY) ×2 IMPLANT
SHEATH AVANTI 11CM 5FR (SHEATH) ×2 IMPLANT
SHEATH AVANTI 11CM 7FR (SHEATH) ×2 IMPLANT
SYR MEDRAD MARK V 150ML (SYRINGE) ×2 IMPLANT
TRANSDUCER W/STOPCOCK (MISCELLANEOUS) ×2 IMPLANT
WIRE EMERALD 3MM-J .035X150CM (WIRE) ×2 IMPLANT

## 2017-08-03 NOTE — Progress Notes (Signed)
Site area: rt fa and fv sheaths Site Prior to Removal:  Level 0 Pressure Applied For:  20 minutes Manual:   yes Patient Status During Pull:  stable Post Pull Site:  Level  0 Post Pull Instructions Given:  yes Post Pull Pulses Present: dopplered rt dp Dressing Applied:  Gauze and tegaderm Bedrest begins @ 0930 Comments:  IV saline locked

## 2017-08-03 NOTE — Interval H&P Note (Signed)
History and Physical Interval Note:  08/03/2017 8:08 AM  Justin Fitzgerald  has presented today for surgery, with the diagnosis of arotic stenosis - pre tavr  The various methods of treatment have been discussed with the patient and family. After consideration of risks, benefits and other options for treatment, the patient has consented to  Procedure(s): RIGHT/LEFT HEART CATH AND CORONARY ANGIOGRAPHY (N/A) as a surgical intervention .  The patient's history has been reviewed, patient examined, no change in status, stable for surgery.  I have reviewed the patient's chart and labs.  Questions were answered to the patient's satisfaction.     [name]

## 2017-08-03 NOTE — Discharge Instructions (Signed)

## 2017-08-06 MED FILL — Heparin Sodium (Porcine) 2 Unit/ML in Sodium Chloride 0.9%: INTRAMUSCULAR | Qty: 1000 | Status: AC

## 2017-08-06 MED FILL — Fentanyl Citrate Preservative Free (PF) Inj 100 MCG/2ML: INTRAMUSCULAR | Qty: 2 | Status: AC

## 2017-08-13 ENCOUNTER — Encounter (HOSPITAL_COMMUNITY): Payer: Self-pay

## 2017-08-13 ENCOUNTER — Ambulatory Visit (HOSPITAL_COMMUNITY): Payer: Medicare Other

## 2017-08-13 ENCOUNTER — Inpatient Hospital Stay (HOSPITAL_COMMUNITY): Admission: RE | Admit: 2017-08-13 | Payer: Self-pay | Source: Ambulatory Visit

## 2017-08-13 ENCOUNTER — Ambulatory Visit (HOSPITAL_COMMUNITY): Admission: RE | Admit: 2017-08-13 | Payer: Medicare Other | Source: Ambulatory Visit

## 2017-08-13 ENCOUNTER — Telehealth: Payer: Self-pay

## 2017-08-13 NOTE — Telephone Encounter (Signed)
Voicemail left on my office phone, Sunday 3/31, from Justin Fitzgerald in regards to pt passing away and family requesting that tests be cancelled for TAVR evaluation.    Message was received by me today and tests cancelled.  I contacted Eber Jones (close family friend) and made her aware that TAVR evaluation has been cancelled and to give my condolences to his family.  She states the pt passed away in the early hours yesterday morning.  He had been up and down all night and the last time he got up he fell and hit the front of his head. CPR was initiated but the pt could not be revived.   Dr Excell Seltzer made aware that the pt passed away.

## 2017-08-13 DEATH — deceased

## 2017-08-14 ENCOUNTER — Encounter: Payer: Self-pay | Admitting: Surgery

## 2017-08-20 ENCOUNTER — Ambulatory Visit: Payer: Medicare Other | Admitting: Physical Therapy

## 2017-08-20 ENCOUNTER — Encounter: Payer: Self-pay | Admitting: Thoracic Surgery (Cardiothoracic Vascular Surgery)

## 2018-02-01 IMAGING — MR MR MRA HEAD W/O CM
11 of 17 series · 22 of 48 positions shown · non-contrast
Comparison: CT HEAD June 21, 2016 at 3323 hours

CLINICAL DATA: LEFT facial droop and slurred speech beginning this
morning. LEFT hand weakness. Follow-up code stroke. History of
end-stage on dialysis, hypertension, hyperlipidemia and diabetes.

EXAM:
MR HEAD WITHOUT CONTRAST
MR CIRCLE OF WILLIS WITHOUT CONTRAST
MRA OF THE NECK WITHOUT CONTRAST
TECHNIQUE: Multiplanar, multiecho pulse sequences of the brain, circle of
Willis and surrounding structures were obtained without intravenous
contrast. Angiographic images of the neck were obtained using MRA
technique without intravenous contrast.

[Series 2: FLAIR · sagittal · 5.0mm · 0.47mm/px · 1 of 23 slices shown (1 of 2)]
[im 1/23]
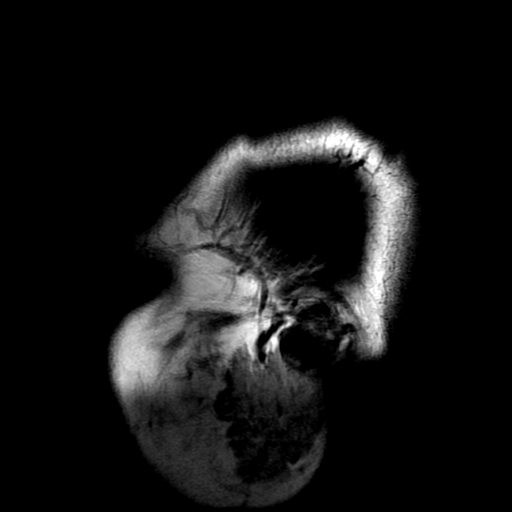

[Series 4: DWI · axial · 3.0mm · 0.94mm/px · z∈[-70,+76]mm · 3 of 100 slices shown (1 of 2)]
[im 1/100]
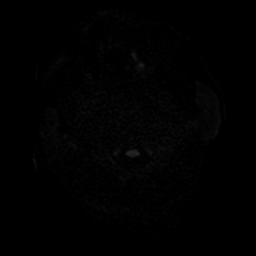
[im 50/100]
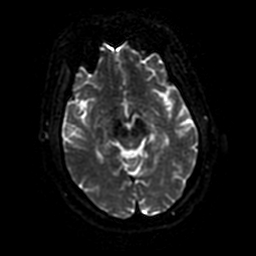
[im 100/100]
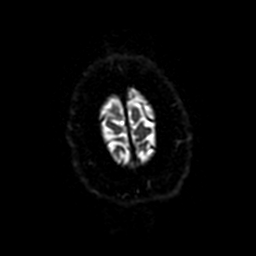

[Series 5: DWI · coronal · 4.0mm · 0.94mm/px · 1 of 70 slices shown (2 of 2)]
[im 1/70]
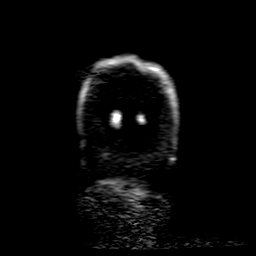

[Series 6: ax (id) 2 · axial · 1.0mm · 0.43mm/px · z∈[-99,+18]mm · 7 of 236 slices shown]
[im 1/236]
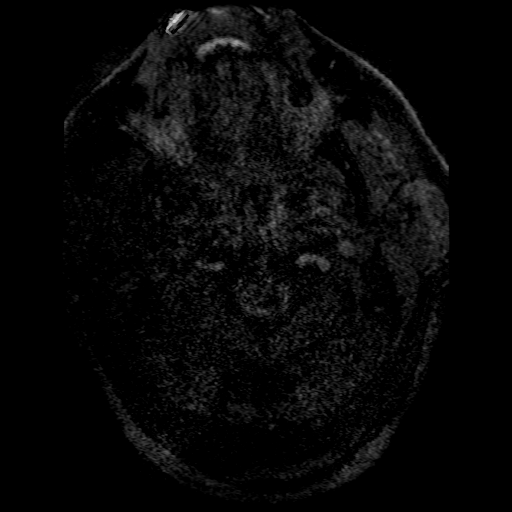
[im 40/236]
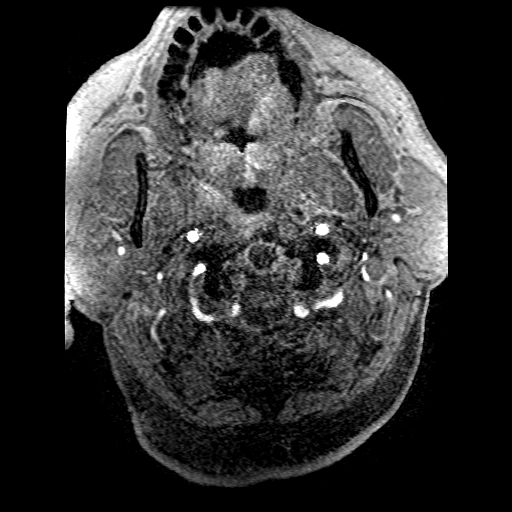
[im 79/236]
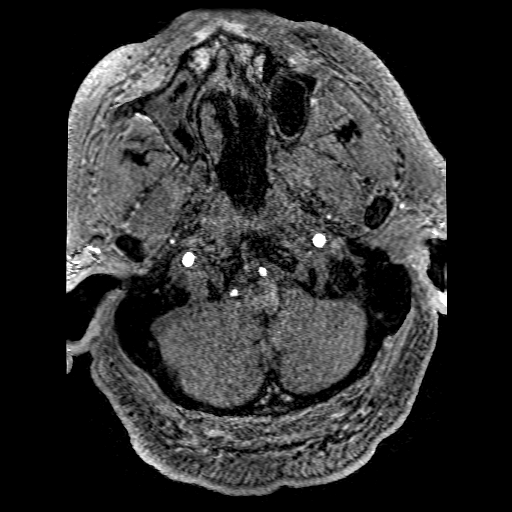
[im 118/236]
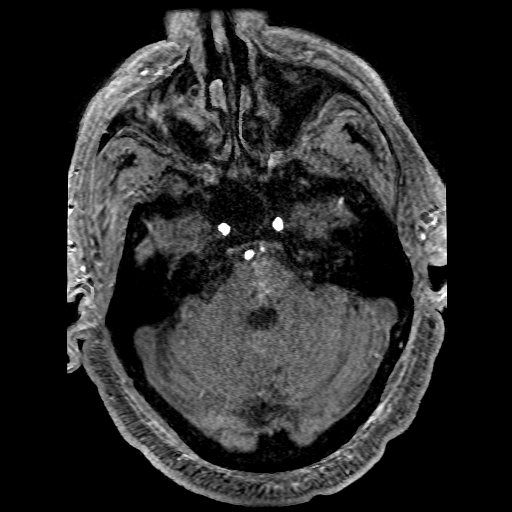
[im 157/236]
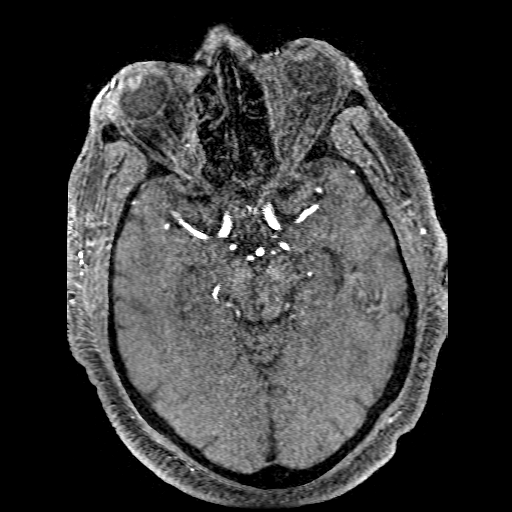
[im 196/236]
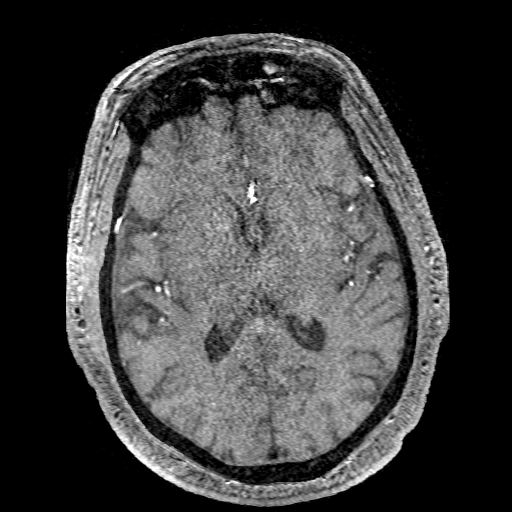
[im 236/236]
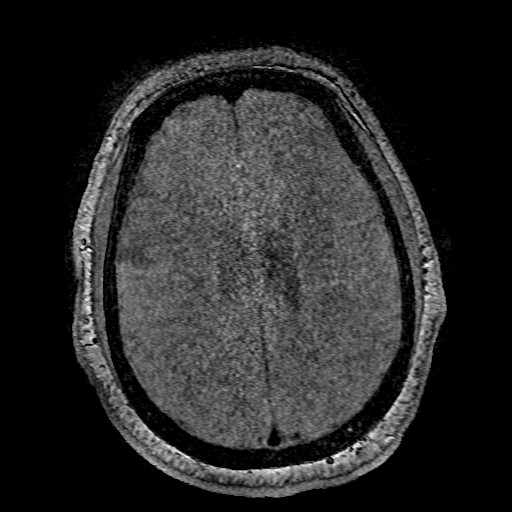

[Series 7: (id) loc ssfse · axial · 5.0mm · 0.59mm/px · 1 of 13 slices shown]
[im 1/13]
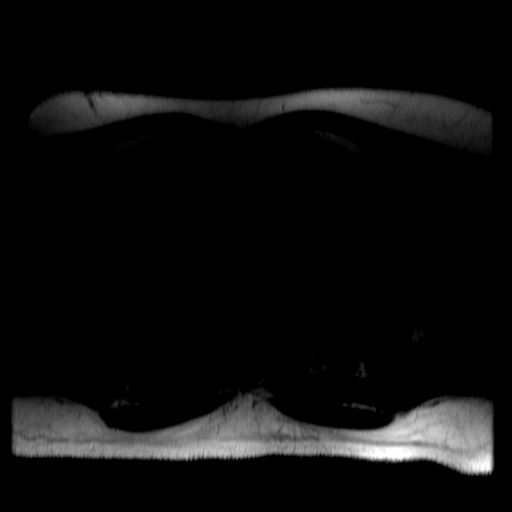

[Series 14: sag inhance (id) · sagittal · 1.2mm · 0.47mm/px · 3 of 360 slices shown]
[im 1/360]
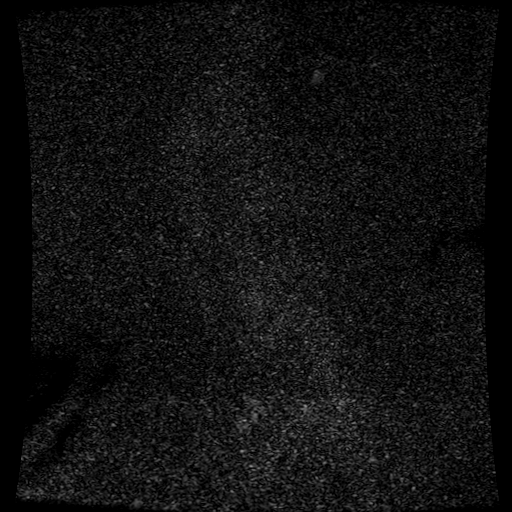
[im 60/360]
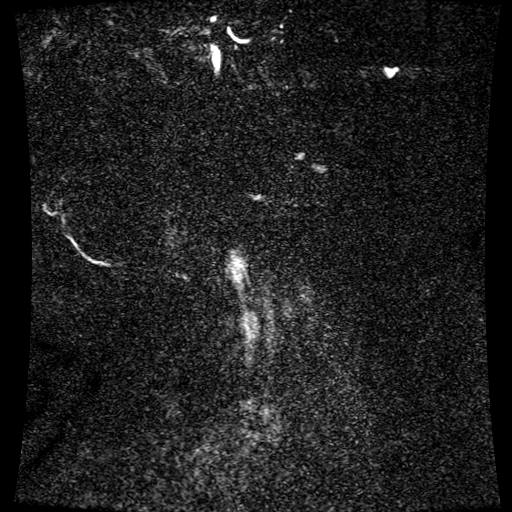
[im 120/360]
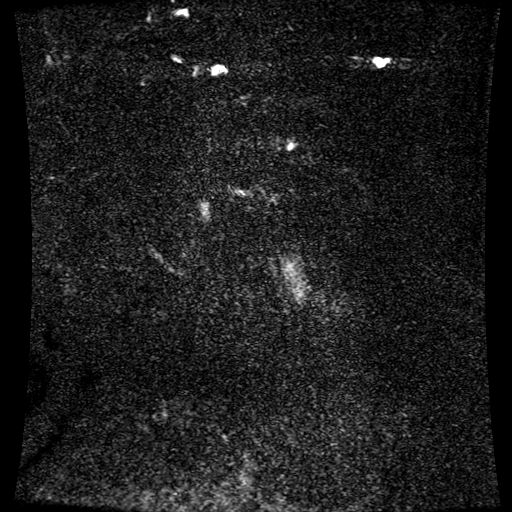

[Series 15: FLAIR · axial · 5.0mm · 0.47mm/px · 1 of 25 slices shown (2 of 2)]
[im 1/25]
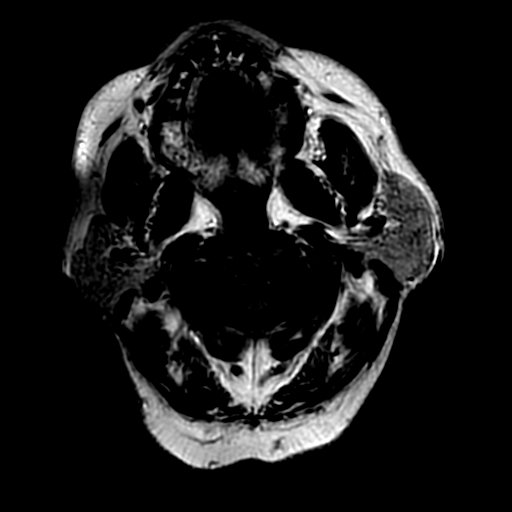

[Series 17: T2 · axial · 5.0mm · 0.47mm/px · 1 of 25 slices shown (1 of 2)]
[im 1/25]
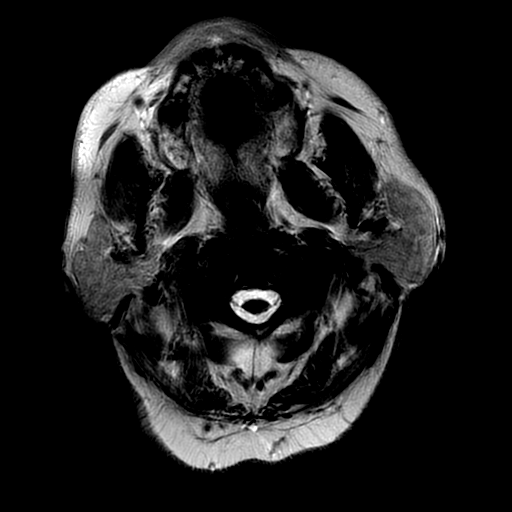

[Series 20: T2 · coronal · 5.0mm · 0.47mm/px · 1 of 29 slices shown (2 of 2)]
[im 1/29]
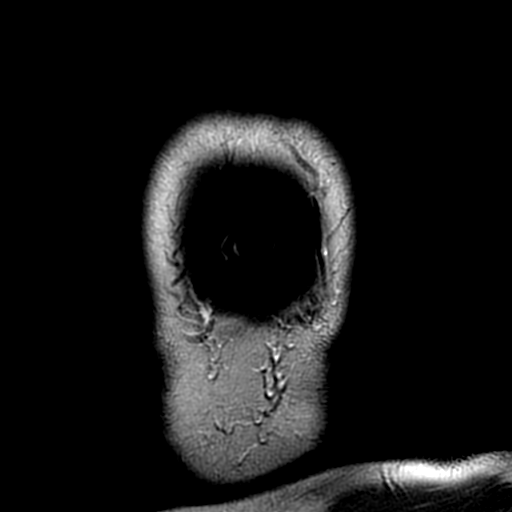

[Series 450: ADC · axial · 3.0mm · 0.94mm/px · z∈[-70,+76]mm · 2 of 50 slices shown (1 of 2)]
[im 1/50]
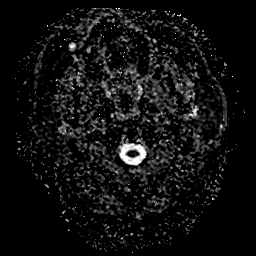
[im 50/50]
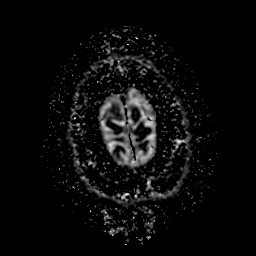

[Series 550: ADC · coronal · 4.0mm · 0.94mm/px · 1 of 35 slices shown (2 of 2)]
[im 1/35]
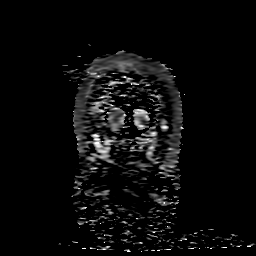

[22 of 48 positions shown; findings below may reference images not displayed]

FINDINGS: MR HEAD FINDINGS- multiple sequences are mildly motion degraded.

BRAIN: Subcentimeter focus of reduced diffusion RIGHT frontal and
parietal lobes with low ADC values. Patchy reduced diffusion RIGHT
basal ganglia and insula with normalizing ADC values in intermediate
to bright FLAIR T2 hyperintense signal. No susceptibility artifact
to suggest hemorrhage. The ventricles and sulci are normal for
patient's age. Greater than expected scattered subcentimeter
supratentorial white matter FLAIR T2 hyperintensities, exclusive of
the aforementioned abnormality. No mass effect. No abnormal
extra-axial fluid collections.

VASCULAR: Normal major intracranial vascular flow voids present at
skull base.

SKULL AND UPPER CERVICAL SPINE: No abnormal sellar expansion. No
suspicious calvarial bone marrow signal. Craniocervical junction
maintained.

SINUSES/ORBITS: Mild paranasal sinus mucosal thickening. Mastoid air
cells are well aerated. The included ocular globes and orbital
contents are non-suspicious.

OTHER: None.

MR CIRCLE OF WILLIS FINDINGS- moderately motion degraded examination
.

ANTERIOR CIRCULATION: Normal flow related enhancement of the
included cervical, petrous, cavernous and supraclinoid internal
carotid arteries. Patent anterior communicating artery. Flow related
enhancement of the anterior and middle cerebral arteries, including
distal segments. Moderate stenosis distal RIGHT M1 and severe RIGHT
M2 origins. Moderate stenosis proximal LEFT M2 origin, superior
division.

No large vessel occlusion, abnormal luminal irregularity, aneurysm.

POSTERIOR CIRCULATION: LEFT vertebral artery is dominant. Basilar
artery is patent, with normal flow related enhancement of the main
branch vessels. Mild luminal irregularity bilateral vertebral
arteries compatible with atherosclerosis. Normal flow related
enhancement of the posterior cerebral arteries.

No large vessel occlusion, high-grade stenosis, abnormal luminal
irregularity, aneurysm.

ANATOMIC VARIANTS: None.

MRA NECK FINDINGS- limited noncontrast, moderately motion degraded
examination.

ANTERIOR CIRCULATION: The common carotid arteries are widely patent
bilaterally origins not imaged due to time-of-flight technique. The
carotid bifurcation is patent bilaterally and there is no
hemodynamically significant carotid stenosis by NASCET criteria. No
evidence for atherosclerosis or flow limiting stenosis of the
cervical internal carotid arteries.

POSTERIOR CIRCULATION: Bilateral vertebral arteries are patent to
the vertebrobasilar junction, origins not imaged. No evidence for
atherosclerosis or flow limiting stenosis.

Source images and MIP image were reviewed.
IMPRESSION: MRI HEAD: Mildly motion degraded examination. Multifocal acute on
subacute RIGHT MCA territory infarcts including RIGHT basal ganglia.

Mild to moderate white matter changes compatible chronic small
vessel ischemic disease.

MRA HEAD: Moderately motion degraded examination. No emergent large
vessel occlusion. Severe stenosis RIGHT M2 origin.

Moderate stenosis RIGHT M1 and LEFT M2 origin.

MRA NECK: Negative noncontrast moderately motion degraded
examination.
